# Patient Record
Sex: Female | Born: 1940 | Race: White | Hispanic: No | Smoking: Never smoker
Health system: Southern US, Community
[De-identification: ages and names within clinical notes are randomized; demographics above are authoritative.]

## PROBLEM LIST (undated history)

## (undated) DIAGNOSIS — I499 Cardiac arrhythmia, unspecified: Secondary | ICD-10-CM

## (undated) DIAGNOSIS — K219 Gastro-esophageal reflux disease without esophagitis: Secondary | ICD-10-CM

## (undated) DIAGNOSIS — Z87442 Personal history of urinary calculi: Secondary | ICD-10-CM

## (undated) DIAGNOSIS — E7800 Pure hypercholesterolemia, unspecified: Secondary | ICD-10-CM

## (undated) DIAGNOSIS — I10 Essential (primary) hypertension: Secondary | ICD-10-CM

## (undated) DIAGNOSIS — I480 Paroxysmal atrial fibrillation: Secondary | ICD-10-CM

## (undated) DIAGNOSIS — D509 Iron deficiency anemia, unspecified: Secondary | ICD-10-CM

## (undated) DIAGNOSIS — F419 Anxiety disorder, unspecified: Secondary | ICD-10-CM

## (undated) DIAGNOSIS — C4490 Unspecified malignant neoplasm of skin, unspecified: Secondary | ICD-10-CM

## (undated) DIAGNOSIS — M1990 Unspecified osteoarthritis, unspecified site: Secondary | ICD-10-CM

## (undated) DIAGNOSIS — I700 Atherosclerosis of aorta: Secondary | ICD-10-CM

## (undated) DIAGNOSIS — Z7901 Long term (current) use of anticoagulants: Secondary | ICD-10-CM

## (undated) HISTORY — PX: COLONOSCOPY: SHX174

## (undated) HISTORY — DX: Paroxysmal atrial fibrillation: I48.0

## (undated) HISTORY — PX: SEPTOPLASTY: SUR1290

## (undated) HISTORY — PX: ABDOMINAL HYSTERECTOMY: SHX81

## (undated) HISTORY — PX: JOINT REPLACEMENT: SHX530

---

## 1971-01-22 HISTORY — PX: BREAST EXCISIONAL BIOPSY: SUR124

## 2004-04-18 ENCOUNTER — Ambulatory Visit: Payer: Self-pay | Admitting: Unknown Physician Specialty

## 2004-09-18 ENCOUNTER — Other Ambulatory Visit: Payer: Self-pay

## 2004-09-18 ENCOUNTER — Observation Stay: Payer: Self-pay | Admitting: Internal Medicine

## 2005-05-14 ENCOUNTER — Ambulatory Visit: Payer: Self-pay | Admitting: Unknown Physician Specialty

## 2005-05-27 ENCOUNTER — Ambulatory Visit: Payer: Self-pay | Admitting: Gastroenterology

## 2005-08-06 ENCOUNTER — Ambulatory Visit: Payer: Self-pay | Admitting: Gastroenterology

## 2006-06-11 ENCOUNTER — Ambulatory Visit: Payer: Self-pay | Admitting: Unknown Physician Specialty

## 2006-11-13 ENCOUNTER — Emergency Department: Payer: Self-pay | Admitting: Emergency Medicine

## 2006-11-21 ENCOUNTER — Emergency Department: Payer: Self-pay | Admitting: Emergency Medicine

## 2007-06-23 ENCOUNTER — Ambulatory Visit: Payer: Self-pay | Admitting: Unknown Physician Specialty

## 2008-02-18 ENCOUNTER — Ambulatory Visit: Payer: Self-pay | Admitting: Unknown Physician Specialty

## 2008-06-29 ENCOUNTER — Ambulatory Visit: Payer: Self-pay | Admitting: Unknown Physician Specialty

## 2009-07-17 ENCOUNTER — Ambulatory Visit: Payer: Self-pay | Admitting: Unknown Physician Specialty

## 2010-01-07 ENCOUNTER — Inpatient Hospital Stay: Payer: Self-pay | Admitting: Internal Medicine

## 2010-09-19 ENCOUNTER — Ambulatory Visit: Payer: Self-pay | Admitting: Unknown Physician Specialty

## 2010-09-25 ENCOUNTER — Ambulatory Visit: Payer: Self-pay | Admitting: Gastroenterology

## 2010-11-30 ENCOUNTER — Ambulatory Visit: Payer: Self-pay | Admitting: Orthopedic Surgery

## 2010-12-17 ENCOUNTER — Ambulatory Visit: Payer: Self-pay | Admitting: Orthopedic Surgery

## 2010-12-17 DIAGNOSIS — I10 Essential (primary) hypertension: Secondary | ICD-10-CM

## 2011-01-17 ENCOUNTER — Inpatient Hospital Stay: Payer: Self-pay | Admitting: Orthopedic Surgery

## 2011-01-18 LAB — PATHOLOGY REPORT

## 2011-09-20 ENCOUNTER — Ambulatory Visit: Payer: Self-pay | Admitting: Physician Assistant

## 2012-09-22 ENCOUNTER — Ambulatory Visit: Payer: Self-pay | Admitting: Physician Assistant

## 2013-09-23 ENCOUNTER — Ambulatory Visit: Payer: Self-pay | Admitting: Physician Assistant

## 2013-10-05 ENCOUNTER — Ambulatory Visit: Payer: Self-pay | Admitting: Physician Assistant

## 2013-11-07 ENCOUNTER — Emergency Department: Payer: Self-pay | Admitting: Student

## 2013-12-05 ENCOUNTER — Observation Stay: Payer: Self-pay | Admitting: Internal Medicine

## 2013-12-05 ENCOUNTER — Ambulatory Visit: Payer: Self-pay | Admitting: Orthopedic Surgery

## 2013-12-05 LAB — CBC WITH DIFFERENTIAL/PLATELET
Basophil #: 0.1 10*3/uL (ref 0.0–0.1)
Basophil %: 0.6 %
Eosinophil #: 0.3 10*3/uL (ref 0.0–0.7)
Eosinophil %: 3.2 %
HCT: 38.5 % (ref 35.0–47.0)
HGB: 12.6 g/dL (ref 12.0–16.0)
LYMPHS ABS: 3 10*3/uL (ref 1.0–3.6)
Lymphocyte %: 33 %
MCH: 32 pg (ref 26.0–34.0)
MCHC: 32.7 g/dL (ref 32.0–36.0)
MCV: 98 fL (ref 80–100)
MONO ABS: 0.9 x10 3/mm (ref 0.2–0.9)
Monocyte %: 9.6 %
Neutrophil #: 5 10*3/uL (ref 1.4–6.5)
Neutrophil %: 53.6 %
Platelet: 269 10*3/uL (ref 150–440)
RBC: 3.93 10*6/uL (ref 3.80–5.20)
RDW: 13 % (ref 11.5–14.5)
WBC: 9.2 10*3/uL (ref 3.6–11.0)

## 2013-12-05 LAB — COMPREHENSIVE METABOLIC PANEL
ALT: 22 U/L
Albumin: 3.1 g/dL — ABNORMAL LOW (ref 3.4–5.0)
Alkaline Phosphatase: 38 U/L — ABNORMAL LOW
Anion Gap: 3 — ABNORMAL LOW (ref 7–16)
BUN: 30 mg/dL — AB (ref 7–18)
Bilirubin,Total: 0.4 mg/dL (ref 0.2–1.0)
Calcium, Total: 8.4 mg/dL — ABNORMAL LOW (ref 8.5–10.1)
Chloride: 109 mmol/L — ABNORMAL HIGH (ref 98–107)
Co2: 29 mmol/L (ref 21–32)
Creatinine: 0.99 mg/dL (ref 0.60–1.30)
Glucose: 101 mg/dL — ABNORMAL HIGH (ref 65–99)
Osmolality: 288 (ref 275–301)
POTASSIUM: 3.8 mmol/L (ref 3.5–5.1)
SGOT(AST): 9 U/L — ABNORMAL LOW (ref 15–37)
Sodium: 141 mmol/L (ref 136–145)
Total Protein: 6.1 g/dL — ABNORMAL LOW (ref 6.4–8.2)

## 2013-12-05 LAB — URINALYSIS, COMPLETE
BLOOD: NEGATIVE
Bilirubin,UR: NEGATIVE
Glucose,UR: NEGATIVE mg/dL (ref 0–75)
KETONE: NEGATIVE
LEUKOCYTE ESTERASE: NEGATIVE
NITRITE: NEGATIVE
PROTEIN: NEGATIVE
Ph: 6 (ref 4.5–8.0)
RBC,UR: 4 /HPF (ref 0–5)
Specific Gravity: 1.029 (ref 1.003–1.030)
Squamous Epithelial: 7
WBC UR: 4 /HPF (ref 0–5)

## 2013-12-06 LAB — CBC WITH DIFFERENTIAL/PLATELET
Basophil #: 0 10*3/uL (ref 0.0–0.1)
Basophil %: 0.1 %
EOS ABS: 0 10*3/uL (ref 0.0–0.7)
Eosinophil %: 0 %
HCT: 37.6 % (ref 35.0–47.0)
HGB: 12.3 g/dL (ref 12.0–16.0)
LYMPHS ABS: 1.4 10*3/uL (ref 1.0–3.6)
LYMPHS PCT: 22.8 %
MCH: 32.3 pg (ref 26.0–34.0)
MCHC: 32.6 g/dL (ref 32.0–36.0)
MCV: 99 fL (ref 80–100)
Monocyte #: 0.1 x10 3/mm — ABNORMAL LOW (ref 0.2–0.9)
Monocyte %: 1.7 %
NEUTROS ABS: 4.7 10*3/uL (ref 1.4–6.5)
NEUTROS PCT: 75.4 %
Platelet: 283 10*3/uL (ref 150–440)
RBC: 3.8 10*6/uL (ref 3.80–5.20)
RDW: 13.1 % (ref 11.5–14.5)
WBC: 6.2 10*3/uL (ref 3.6–11.0)

## 2013-12-06 LAB — BASIC METABOLIC PANEL
ANION GAP: 5 — AB (ref 7–16)
BUN: 29 mg/dL — ABNORMAL HIGH (ref 7–18)
CALCIUM: 8 mg/dL — AB (ref 8.5–10.1)
CREATININE: 0.88 mg/dL (ref 0.60–1.30)
Chloride: 109 mmol/L — ABNORMAL HIGH (ref 98–107)
Co2: 27 mmol/L (ref 21–32)
EGFR (African American): >60
EGFR (Non-African Amer.): >60
GLUCOSE: 157 mg/dL — AB (ref 65–99)
Osmolality: 290 (ref 275–301)
Potassium: 5.2 mmol/L — ABNORMAL HIGH (ref 3.5–5.1)
SODIUM: 141 mmol/L (ref 136–145)

## 2014-01-12 ENCOUNTER — Ambulatory Visit: Payer: Self-pay | Admitting: Physician Assistant

## 2014-05-14 NOTE — Discharge Summary (Signed)
PATIENT NAMECARRIANN, [name] MR#:  [id number] DATE OF BIRTH:  October 11, 1940  DATE OF ADMISSION:  12/05/2013 DATE OF DISCHARGE:  12/07/2013  DISCHARGE DIAGNOSES: 1.  Low back pain and lumbar radiculopathy. 2.  Hypertension.  3.  Depression.  DISCHARGE MEDICATIONS: 1.  Omeprazole 20 mg p.o. b.i.d.  2.  HCTZ/Losartan 12.5/100 mg p.o. daily. 3.  Lexapro 10 mg p.o. at bedtime. 4.  Metoprolol succinate 25 mg p.o. daily.  5.  Simvastatin 20 mg p.o. daily. 6.  Calcium with vitamin D 1 tablet at night. 7.  Estradiol 0.5 mg p.o. daily. 8.  Tylenol Arthritis 650 mg to 1000 mg daily as needed for pain.  9.  Bisacodyl 10 mg rectal suppository as needed for constipation.  10.  Flexeril 10 mg p.o. t.i.d. 11.  Prednisone 20 mg 3 tablets daily for 3 days, 2 tablets daily for 3 days, then 1 tablet daily for 3 days.   DISCHARGE FOLLOWUP: The patient will follow up with Dr. Rudene Christians in 2 weeks.   DISPOSITION: Discharge to [facility] for rehab.   CONSULTATIONS: Orthopedics with Dr. Stacy Gardner.   HOSPITAL COURSE: This is a 74 year old female patient who has been admitted because of low back pain radiating to the right leg up to the knee joint. The patient was unable to ambulate at home and admitted to observation status secondary to severe low back pain and going to the right leg with ambulatory dysfunction. The patient has been having this problem for about 2 to 3 weeks and sees Dr. Rudene Christians for that. She had an epidural steroid injection 10 days ago and was given hydrocodone. The patient felt the symptoms are not getting better despite the steroid injection and narcotics, and she has trouble with ambulation, so because of that she was admitted to hospitalist service. The patient has past medical history significant for hypertension, depression and hyperlipidemia. The patient's CT scan of the lower back showed mild arthrosis, no fractures. She was started on IV Dilaudid along with Percocet 5/325 every 4 to 6 hours and  prednisone and Flexeril. The patient's symptoms improved and she says she can go home and take care of herself. Physical therapy has seen the patient, and the patient is accepted at [facility] rehab. The patient advised to have MRI of LS-spine, but she got very claustrophobic and they could not do MRI. The patient told me that she does not want MRI of the LS-spine to evaluate for any disk herniation or nerve impingement. I told her that she can continue Flexeril, Percocet and prednisone and see Dr. Rudene Christians in 7 to 10 days, and if she needs MRI that can be arranged as open MRI. The patient's has other medical problems of hypertension, which is controlled, and has history of hyperlipidemia. The patient's blood pressure today is 154/64, heart rate 64. The patient's blood pressure was elevated at times yesterday and today morning it was 206/69, but repeat blood pressure is within normal limits. The patient's condition discussed with the family.  PHYSICAL EXAMINATION TODAY:  CARDIOVASCULAR: S1, S2 regular.  LUNGS: Clear to auscultation. No wheeze, no rales. BACK: There is no tenderness, like lower back.  NEUROLOGIC: Power is 5/5 in the upper and lower extremities. The patient has no sensory deficit in the lower extremities. SLR test is negative. DIAGNOSTIC DATA: Pertinent lab data: WBC 6.2, hemoglobin 12.3, hematocrit 37.6, platelets 283,000. Electrolytes: Sodium 141, potassium 5.2, chloride 109, bicarb 27, BUN 29, creatinine 0.88, glucose 157. The patient's UA is clear.  CT of the lumbar spine showed degenerative disk disease and ligamentum flavum hypertrophy at L3, L4 and L5. The patient has moderate large broad-based disk bulge present at L4, L5. Nonobstructing right renal calculus.   The patient will go to [facility]. Primary doctor is Ms. Paulita Cradle. She will follow up with Dr. Rudene Christians as well.  TIME SPENT: More than 35 minutes.  ____________________________ Epifanio Lesches,  MD sk:sb D: 12/07/2013 09:58:27 ET T: 12/07/2013 10:14:24 ET JOB#: [id number]  cc: Epifanio Lesches, MD, <Dictator> Laurene Footman, MD [facility]" [place], [place] Epifanio Lesches MD ELECTRONICALLY SIGNED 12/20/2013 17:51

## 2014-05-14 NOTE — Consult Note (Signed)
PATIENT NAMEAMALEA, OTTEY MR#:  [id number] DATE OF BIRTH:  07-Apr-1940  DATE OF CONSULTATION:  12/05/2013  REFERRING PHYSICIAN:   CONSULTING PHYSICIAN:  Claud Kelp, MD  CHIEF COMPLAINT: Back and radiating right leg pain.   HISTORY OF PRESENT ILLNESS: Ms. Siebels is a 74 year old female who is well-known to Dr. 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 She presented to the Emergency Room today with a severe episode of lower back pain radiating to her right leg. She states the pain radiated down the posterior lateral aspect of her leg and stopped at the level of the knee. Her pain was improved narcotic pain medication, as well as oral steroids, and she was admitted for pain control and further evaluation. She denies any numbness or tingling down into her foot. She denies any loss of bowel or bladder control and she denies any saddle anesthesia. She also denies any muscle atrophy or weakness, and states this was just an acute pain episode.   PAST MEDICAL HISTORY: Significant for reflux, arthritis, hypertension, hypercholesterolemia, and her sciatica.   HOME MEDICATIONS: Include vitamin D3, Zocor, Tylenol, OxyContin, Nucynta, metoprolol, Lexapro, hydrochlorothiazide, estradiol, calcium, and Protonix.   ALLERGIES: She has no known drug allergies.   FAMILY HISTORY: Noncontributory.   REVIEW OF SYSTEMS: She denies any headache, blurry vision, double vision, hearing loss, or tinnitus. She denies any difficulty swallowing. She denies chest pain or shortness of breath. She denies nausea, vomiting, but she is positive for constipation.   PHYSICAL EXAMINATION:  GENERAL: The patient is resting comfortably in bed.  EXTREMITIES: She has sensation intact in all distributions to her right lower extremity. She has intact patellar and Achilles reflexes. She has 5/5 strength to ankle dorsiflexion, great toe  dorsiflexion, and ankle plantar flexion, as well as hip flexion, and knee flexion and knee extension. She has negative log roll and negative Stinchfield test. She has a negative straight leg test and negative crossed straight leg tests.  RADIOGRAPHS: CT scan was reviewed, which demonstrates some mild arthrosis, but no fracture seen.   ASSESSMENT: This is a 74 year old female with an episode of acute low back pain and radiculopathy. The patient was counseled she may benefit from an MRI of her lumbar spine to look for any nerve root impingement, disk herniation, or facet arthrosis that may be triggering her pain. Continue supportive measures at this time and awaiting lumbar spine MRI.    ____________________________ Claud Kelp, MD tte:ts D: 12/05/2013 16:19:26 ET T: 12/05/2013 16:56:20 ET JOB#: [id number]  cc: Claud Kelp, MD, <Dictator> Claud Kelp MD ELECTRONICALLY SIGNED 12/05/2013 18:59

## 2014-05-14 NOTE — Consult Note (Signed)
Brief Consult Note: Diagnosis: lumbar radiculopathy.   Patient was seen by consultant.   Consult note dictated.   Orders entered.   Comments: Recommend L-spine MRI to furter assess.  Electronic Signatures: Claud Kelp (MD)  (Signed [phone] 16:20)  Authored: Brief Consult Note   Last Updated: 15-Nov-15 16:20 by Claud Kelp (MD)

## 2014-05-14 NOTE — H&P (Signed)
PATIENT NAME:  [name], [name] MR#:  [id number] DATE OF BIRTH:  17-Aug-1940  DATE OF ADMISSION:  12/05/2013  REFERRING PHYSICIAN: Briant Sites. Joni Fears, MD   FAMILY MEDICAL PROVIDER: Prentiss Bells "Mimi" Carrie Mew, PA-C  REASON FOR ADMISSION: Intractable back pain.   HISTORY OF PRESENT ILLNESS: The patient is a 74 year old female with a significant history of known degenerative disk disease, chronic atrial fibrillation, obesity, and hypertension. She is followed closely by Dr. Rudene Christians and has received previous injection therapy for sciatica. She presents now with acute onset of low back pain radiating to the right leg. She is unable to ambulate. She was given multiple doses of Dilaudid in the Emergency Room with no improvement of her symptoms. CT shows a disk bulge in the lumbar area. She is now admitted for further evaluation.   PAST MEDICAL HISTORY:  1.  Chronic back pain.  2.  Degenerative disk disease.  3.  GE reflux disease.  4.  Osteoarthritis.  5.  Hyperlipidemia.  6.  Benign hypertension.  7.  Chronic atrial fibrillation.  8.  Status post hysterectomy.  9.  Status post appendectomy.   MEDICATIONS:  1.  Vitamin D3 2000 units p.o. daily.  2.  Simvastatin 20 mg p.o. at bedtime.  3.  OxyContin 10 mg p.o. q.12 hours p.r.n.  4.  Omeprazole 20 mg p.o. b.i.d. p.r.n.  5.  Norco 5/325 one p.o. q.8 hours p.r.n. pain.  6.  Toprol-XL 25 mg p.o. daily.  7.  Lexapro 10 mg p.o. daily.  8.  Hyzaar 100/12.5 one p.o. daily. 9.  Estrace 0.5 mg p.o. daily.   ALLERGIES: No known drug allergies.   SOCIAL HISTORY: Negative for alcohol or tobacco abuse.   FAMILY HISTORY: Positive for hypertension and stroke. Negative for breast or colon cancer.   REVIEW OF SYSTEMS:  CONSTITUTIONAL: No fever or change in weight.  EYES: No blurred or double vision. No glaucoma.  ENT: No tinnitus or hearing loss. No nasal discharge or bleeding. No difficulty swallowing.  RESPIRATORY: No cough or wheezing. Denies hemoptysis.  No painful respiration.  CARDIOVASCULAR: No chest pain or orthopnea. No palpitations or syncope.  GASTROINTESTINAL: No nausea, vomiting, or diarrhea. No abdominal pain. No change in bowel habits.  GENITOURINARY: No dysuria or hematuria. No incontinence.  ENDOCRINE: No polyuria or polydipsia. No heat or cold intolerance.  HEMATOLOGIC: The patient denies anemia, easy bruising, or bleeding.  LYMPHATIC: No swollen glands.  MUSCULOSKELETAL: The patient denies pain in her neck, shoulders, although she does have back, hip, and knee pain on the right.  NEUROLOGIC: No migraines. Denies stroke or seizures.  PSYCHOLOGICAL: The patient denies anxiety, insomnia or depression.   PHYSICAL EXAMINATION:  GENERAL: The patient is acutely ill-appearing, in moderate distress.  VITAL SIGNS: Currently remarkable for a blood pressure of 227/90 with a heart rate of 80, respiratory rate of 24, temperature of 98.3, saturation 100% on room air.  HEENT: Normocephalic, atraumatic. Pupils are equally round, reactive to light and accommodation. Extraocular movements are intact. Sclerae are not icteric. Conjunctivae are clear. Oropharynx is clear.  NECK: Supple without JVD. No adenopathy or thyromegaly is noted.  LUNGS: Clear to auscultation and percussion without wheezes, rales, or rhonchi. No dullness. Respiratory effort is normal.  CARDIAC: Irregularly irregular rhythm. No significant rubs or gallops. PMI is nondisplaced. Chest wall is nontender.  ABDOMEN: Soft, nontender, with normoactive bowel sounds. No organomegaly or masses were appreciated. No hernias or bruits were noted.  EXTREMITIES: Without clubbing, cyanosis or edema. Pulses were 2+  bilaterally.  SKIN: Warm and dry without rash or lesions.  NEUROLOGIC: Cranial nerves II through XII grossly intact. Deep tendon reflexes are symmetric. Motor and sensory examination is nonfocal.  PSYCHIATRIC: Revealed a patient who is alert and oriented to person, place, and time.  She was cooperative and used good judgment.   LABORATORY DATA: White count was 9.2 with a hemoglobin of 12.6. Glucose 101 with a BUN of 30, creatinine of 0.99, and a potassium of 3.8 with a sodium of 141. Urinalysis was unremarkable. CT of the lumbar spine revealed degenerative disk disease with broad-based bulges at L3-L4 and L4-L5. There is some spinal narrowing noted at L4-L5.   ASSESSMENT:  1.  Degenerative disk disease with lumbar radiculopathy.  2.  Ambulatory dysfunction.  3.  Intractable back pain.  4.  Chronic atrial fibrillation.  5.  Obesity.  6.  Benign hypertension.   PLAN: The patient will be observed on telemetry with IV Dilaudid as needed for pain. We will give a 1-time dose of IV steroids at this time and begin prednisone tomorrow. We will begin Flexeril and a heating pad. We will consult physical therapy and orthopedics. Continue her outpatient regimen including aspirin. Follow up routine labs in the morning. Further treatment and evaluation will depend upon the patient's progress.   TOTAL TIME SPENT ON THIS PATIENT: 50 minutes.    ____________________________ Leonie Douglas Doy Hutching, MD jds:ah D: 12/05/2013 15:10:33 ET T: 12/05/2013 16:29:30 ET JOB#: [id number]  cc: Leonie Douglas. Doy Hutching, MD, <Dictator>  Lennice Sites MD ELECTRONICALLY SIGNED 12/05/2013 21:22

## 2014-05-15 NOTE — Discharge Summary (Signed)
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   ATTENDING: Hessie Knows, MD ([place])  PROCEDURES: On 01/17/2011 the patient underwent right total knee replacement by Dr. Rudene Christians.   ANESTHESIA: Spinal.   ESTIMATED BLOOD LOSS: 100 mL.  TOURNIQUET TIME: 59 minutes.  SPECIMENS SENT: Cut ends of bone.   OPERATIVE FINDINGS: Severe medial compartment and patellofemoral erosion to bone.   IMPLANTS: Medacta, My-Knee.  DRAINS: None were placed.   COMPLICATIONS: No complications occurred.   HISTORY: Ms. Winterhalter is a pleasant 74 year old with history of right knee osteoarthritis. She had had prior steroid and Synvisc injections with minimal relief. She had been taking nabumetone as well as trying a home exercise program. She has been having increased pain and difficulty doing activities of daily living like taking care of things around the home as well as any extended walking outside the home including going to the store. She has had pain at night as well as during the day. It bothers her a great deal after sitting for prolonged time. The pain has been anterior as well as medial. Prior radiographic films show medial compartment and patellofemoral degenerative changes.   PHYSICAL EXAMINATION: HEART: Reasonable rate but some abnormality of rhythm. No murmur noted. LUNGS: Posterior fields clear to auscultation. RIGHT KNEE: Previous exam revealed crepitation of range of motion with 10 to 100 degrees range of motion. No instability. There is a moderate Baker's cyst as well as moderate effusion. There is crepitation to the medial and patellofemoral joint regions.   HOSPITAL COURSE: On 01/17/2011 the patient  underwent the aforementioned procedure without complication and was transferred to the PAC-U then the orthopedic floor in stable condition. She would be followed also by Vance Peper PA-C after surgery. Hemoglobin was 10.4 on the first day postoperative. The patient was treated with Lovenox, TED hose, and AV-I boots for deep vein thrombosis prophylaxis. She complained of severe pain to the left knee actually the first night, unfortunately. Her pain would improve as time went on. Her hemoglobin was 9.4 on the second day postoperative. Her incision site was found to be clean and staples were intact upon dressing change. The patient would work while here on multiple occasions with physical therapy ambulating 200 feet with a rolling walker on 01/21/2011. The patient would end up tolerating her diet quite well. She did pass some stool on 01/20/2011.   CONDITION AT DISCHARGE: Stable.   DISPOSITION: Home with home health physical therapy.   DISCHARGE MEDICATIONS:  1. Nucynta 75 mg 1 to 2 every six hours as needed for pain.  2. OxyContin 10 mg every 12 hours. She may also take Tylenol as needed.  3. We request she take a calcium and vitamin D supplement/multivitamin.  4. Xarelto 10 mg once a day.   DISCHARGE INSTRUCTIONS AND FOLLOW-UP: 1. Resume home medications.  2. Regular diet.  3. Weight-bearing as tolerated on the surgical leg. She will elevate it. She wear bilateral TED hose during the day.  4. She will continue using her Polar Care unit but leave her knee dressing on.      5. She will call our office for any disturbing symptoms. She will call our office to confirm a two week follow-up appointment on 01/31/2011 at 9:15  a.m.  6. She will not take nabumetone or aspirin until finished with her Xarelto.  ____________________________ Jerrel Ivory. , [place] jrp:slb D: 01/23/2011 11:20:52 ET T: 01/24/2011 13:29:59 ET JOB#: [id number]  cc: Jerrel Ivory. Charlett Nose, [place], <Dictator> [place]  PA ELECTRONICALLY SIGNED 01/25/2011 8:03

## 2014-07-12 NOTE — Discharge Instructions (Signed)

## 2014-07-13 ENCOUNTER — Ambulatory Visit
Admission: RE | Admit: 2014-07-13 | Discharge: 2014-07-13 | Disposition: A | Discharge status: Home / Self Care | Payer: Medicare Other | Source: Ambulatory Visit | Attending: Ophthalmology | Admitting: Ophthalmology

## 2014-07-13 ENCOUNTER — Ambulatory Visit: Payer: Medicare Other | Admitting: Anesthesiology

## 2014-07-13 ENCOUNTER — Encounter
Admission: RE | Disposition: A | Discharge status: Home / Self Care | Payer: Self-pay | Source: Ambulatory Visit | Attending: Ophthalmology

## 2014-07-13 ENCOUNTER — Encounter: Payer: Self-pay | Admitting: *Deleted

## 2014-07-13 DIAGNOSIS — Z885 Allergy status to narcotic agent status: Secondary | ICD-10-CM | POA: Diagnosis not present

## 2014-07-13 DIAGNOSIS — Z85828 Personal history of other malignant neoplasm of skin: Secondary | ICD-10-CM | POA: Diagnosis not present

## 2014-07-13 DIAGNOSIS — K219 Gastro-esophageal reflux disease without esophagitis: Secondary | ICD-10-CM | POA: Insufficient documentation

## 2014-07-13 DIAGNOSIS — E780 Pure hypercholesterolemia: Secondary | ICD-10-CM | POA: Diagnosis not present

## 2014-07-13 DIAGNOSIS — Z96651 Presence of right artificial knee joint: Secondary | ICD-10-CM | POA: Diagnosis not present

## 2014-07-13 DIAGNOSIS — I10 Essential (primary) hypertension: Secondary | ICD-10-CM | POA: Diagnosis not present

## 2014-07-13 DIAGNOSIS — Z90710 Acquired absence of both cervix and uterus: Secondary | ICD-10-CM | POA: Insufficient documentation

## 2014-07-13 DIAGNOSIS — I4891 Unspecified atrial fibrillation: Secondary | ICD-10-CM | POA: Insufficient documentation

## 2014-07-13 DIAGNOSIS — H2511 Age-related nuclear cataract, right eye: Secondary | ICD-10-CM | POA: Diagnosis not present

## 2014-07-13 DIAGNOSIS — F419 Anxiety disorder, unspecified: Secondary | ICD-10-CM | POA: Insufficient documentation

## 2014-07-13 HISTORY — DX: Pure hypercholesterolemia, unspecified: E78.00

## 2014-07-13 HISTORY — DX: Gastro-esophageal reflux disease without esophagitis: K21.9

## 2014-07-13 HISTORY — DX: Cardiac arrhythmia, unspecified: I49.9

## 2014-07-13 HISTORY — DX: Essential (primary) hypertension: I10

## 2014-07-13 HISTORY — DX: Anxiety disorder, unspecified: F41.9

## 2014-07-13 HISTORY — PX: CATARACT EXTRACTION W/PHACO: SHX586

## 2014-07-13 HISTORY — DX: Unspecified malignant neoplasm of skin, unspecified: C44.90

## 2014-07-13 SURGERY — PHACOEMULSIFICATION, CATARACT, WITH IOL INSERTION
Anesthesia: Monitor Anesthesia Care | Laterality: Right

## 2014-07-13 MED ORDER — EPINEPHRINE HCL 1 MG/ML IJ SOLN
INTRAMUSCULAR | Status: DC | PRN
  Administered 2014-07-13: 69 mL via OPHTHALMIC

## 2014-07-13 MED ORDER — ARMC OPHTHALMIC DILATING GEL
1.0000 "application " | OPHTHALMIC | Status: DC | PRN
  Administered 2014-07-13 (×2): 1 via OPHTHALMIC

## 2014-07-13 MED ORDER — TIMOLOL MALEATE 0.5 % OP SOLN
OPHTHALMIC | Status: DC | PRN
  Administered 2014-07-13: 1 [drp] via OPHTHALMIC

## 2014-07-13 MED ORDER — NA HYALUR & NA CHOND-NA HYALUR 0.4-0.35 ML IO KIT
PACK | INTRAOCULAR | Status: DC | PRN
  Administered 2014-07-13: 1 mL via INTRAOCULAR

## 2014-07-13 MED ORDER — MIDAZOLAM HCL 2 MG/2ML IJ SOLN
INTRAMUSCULAR | Status: DC | PRN
  Administered 2014-07-13: 2 mg via INTRAVENOUS

## 2014-07-13 MED ORDER — HYDRALAZINE HCL 20 MG/ML IJ SOLN
10.0000 mg | Freq: Once | INTRAMUSCULAR | Status: AC
  Administered 2014-07-13: 10 mg via INTRAVENOUS

## 2014-07-13 MED ORDER — POVIDONE-IODINE 5 % OP SOLN
1.0000 "application " | Freq: Once | OPHTHALMIC | Status: AC
  Administered 2014-07-13: 1 via OPHTHALMIC

## 2014-07-13 MED ORDER — BRIMONIDINE TARTRATE 0.2 % OP SOLN
OPHTHALMIC | Status: DC | PRN
  Administered 2014-07-13: 1 [drp] via OPHTHALMIC

## 2014-07-13 MED ORDER — CEFUROXIME OPHTHALMIC INJECTION 1 MG/0.1 ML
INJECTION | OPHTHALMIC | Status: DC | PRN
  Administered 2014-07-13: 0.1 mL via INTRACAMERAL

## 2014-07-13 MED ORDER — FENTANYL CITRATE (PF) 100 MCG/2ML IJ SOLN
INTRAMUSCULAR | Status: DC | PRN
  Administered 2014-07-13 (×2): 50 ug via INTRAVENOUS

## 2014-07-13 MED ORDER — TETRACAINE HCL 0.5 % OP SOLN
1.0000 [drp] | Freq: Once | OPHTHALMIC | Status: AC
  Administered 2014-07-13: 1 [drp] via OPHTHALMIC

## 2014-07-13 SURGICAL SUPPLY — 26 items
CANNULA ANT/CHMB 27GA (MISCELLANEOUS) ×3 IMPLANT
GLOVE SURG LX 7.5 STRW (GLOVE) ×2
GLOVE SURG LX STRL 7.5 STRW (GLOVE) ×1 IMPLANT
GLOVE SURG TRIUMPH 8.0 PF LTX (GLOVE) ×3 IMPLANT
GOWN STRL REUS W/ TWL LRG LVL3 (GOWN DISPOSABLE) ×2 IMPLANT
GOWN STRL REUS W/TWL LRG LVL3 (GOWN DISPOSABLE) ×4
LENS IOL TECNIS 26.0 (Intraocular Lens) ×3 IMPLANT
LENS IOL TECNIS MONO 1P 26.0 (Intraocular Lens) ×1 IMPLANT
MARKER SKIN SURG W/RULER VIO (MISCELLANEOUS) ×3 IMPLANT
NDL RETROBULBAR .5 NSTRL (NEEDLE) IMPLANT
NEEDLE FILTER BLUNT 18X 1/2SAF (NEEDLE) ×2
NEEDLE FILTER BLUNT 18X1 1/2 (NEEDLE) ×1 IMPLANT
PACK CATARACT BRASINGTON (MISCELLANEOUS) ×3 IMPLANT
PACK EYE AFTER SURG (MISCELLANEOUS) ×3 IMPLANT
PACK OPTHALMIC (MISCELLANEOUS) ×3 IMPLANT
RING MALYGIN 7.0 (MISCELLANEOUS) IMPLANT
SUT ETHILON 10-0 CS-B-6CS-B-6 (SUTURE)
SUT VICRYL  9 0 (SUTURE)
SUT VICRYL 9 0 (SUTURE) IMPLANT
SUTURE EHLN 10-0 CS-B-6CS-B-6 (SUTURE) IMPLANT
SYR 3ML LL SCALE MARK (SYRINGE) ×3 IMPLANT
SYR 5ML LL (SYRINGE) IMPLANT
SYR TB 1ML LUER SLIP (SYRINGE) ×3 IMPLANT
WATER STERILE IRR 250ML POUR (IV SOLUTION) ×3 IMPLANT
WATER STERILE IRR 500ML POUR (IV SOLUTION) IMPLANT
WIPE NON LINTING 3.25X3.25 (MISCELLANEOUS) ×3 IMPLANT

## 2014-07-13 NOTE — Anesthesia Preprocedure Evaluation (Signed)
Anesthesia Evaluation  Patient identified by MRN, date of birth, ID band  Reviewed: Allergy & Precautions, H&P , NPO status , Patient's Chart, lab work & pertinent test results  Airway Mallampati: II  TM Distance: >3 FB Neck ROM: full    Dental no notable dental hx.    Pulmonary    Pulmonary exam normal       Cardiovascular hypertension, + dysrhythmias Atrial Fibrillation Rhythm:regular Rate:Normal     Neuro/Psych    GI/Hepatic GERD-  ,  Endo/Other    Renal/GU      Musculoskeletal   Abdominal   Peds  Hematology   Anesthesia Other Findings   Reproductive/Obstetrics                             Anesthesia Physical Anesthesia Plan  ASA: II  Anesthesia Plan: MAC   Post-op Pain Management:    Induction:   Airway Management Planned:   Additional Equipment:   Intra-op Plan:   Post-operative Plan:   Informed Consent: I have reviewed the patients History and Physical, chart, labs and discussed the procedure including the risks, benefits and alternatives for the proposed anesthesia with the patient or authorized representative who has indicated his/her understanding and acceptance.     Plan Discussed with: CRNA  Anesthesia Plan Comments:         Anesthesia Quick Evaluation

## 2014-07-13 NOTE — H&P (Signed)
  The History and Physical notes were scanned in.  The patient remains stable and unchanged from the H&P.   Previous H&P reviewed, patient examined, and there are no changes.  , 07/13/2014 10:49 AM

## 2014-07-13 NOTE — Anesthesia Postprocedure Evaluation (Signed)
  Anesthesia Post-op Note  Patient: [name]  Procedure(s) Performed: Procedure(s): CATARACT EXTRACTION PHACO AND INTRAOCULAR LENS PLACEMENT (IOC) (Right)  Anesthesia type:MAC  Patient location: PACU  Post pain: Pain level controlled  Post assessment: Post-op Vital signs reviewed, Patient's Cardiovascular Status Stable, Respiratory Function Stable, Patent Airway and No signs of Nausea or vomiting  Post vital signs: Reviewed and stable  Last Vitals:  Filed Vitals:   07/13/14 1208  BP: 117/89  Pulse: 61  Temp:   Resp: 16    Level of consciousness: awake, alert  and patient cooperative  Complications: No apparent anesthesia complications

## 2014-07-13 NOTE — Transfer of Care (Signed)
Immediate Anesthesia Transfer of Care Note  Patient: [name]  Procedure(s) Performed: Procedure(s): CATARACT EXTRACTION PHACO AND INTRAOCULAR LENS PLACEMENT (IOC) (Right)  Patient Location: PACU  Anesthesia Type: MAC  Level of Consciousness: awake, alert  and patient cooperative  Airway and Oxygen Therapy: Patient Spontanous Breathing and Patient connected to supplemental oxygen  Post-op Assessment: Post-op Vital signs reviewed, Patient's Cardiovascular Status Stable, Respiratory Function Stable, Patent Airway and No signs of Nausea or vomiting  Post-op Vital Signs: Reviewed and stable  Complications: No apparent anesthesia complications

## 2014-07-13 NOTE — Op Note (Signed)
LOCATION:  [place]   PREOPERATIVE DIAGNOSIS:    Nuclear sclerotic cataract right eye. H25.11   POSTOPERATIVE DIAGNOSIS:  Nuclear sclerotic cataract right eye.     PROCEDURE:  Phacoemusification with posterior chamber intraocular lens placement of the right eye   LENS:   Implant Name Type Inv. Item Serial No. Manufacturer Lot No. LRB No. Used  LENS IMPL INTRAOC [id number] 26.0 - [id number] Intraocular Lens LENS IMPL INTRAOC [id number] 26.0 [id number] AMO   Right 1        ULTRASOUND TIME: 15 % of 0 minutes, 55 seconds.  CDE 15.3   SURGEON:  Wyonia Hough, MD   ANESTHESIA:  Topical with tetracaine drops and 2% Xylocaine jelly.   COMPLICATIONS:  None.   DESCRIPTION OF PROCEDURE:  The patient was identified in the holding room and transported to the operating room and placed in the supine position under the operating microscope.  The right eye was identified as the operative eye and it was prepped and draped in the usual sterile ophthalmic fashion.   A 1 millimeter clear-corneal paracentesis was made at the 12:00 position.  The anterior chamber was filled with Viscoat viscoelastic.  A 2.4 millimeter keratome was used to make a near-clear corneal incision at the 9:00 position.  A curvilinear capsulorrhexis was made with a cystotome and capsulorrhexis forceps.  Balanced salt solution was used to hydrodissect and hydrodelineate the nucleus.   Phacoemulsification was then used in stop and chop fashion to remove the lens nucleus and epinucleus.  The remaining cortex was then removed using the irrigation and aspiration handpiece. Provisc was then placed into the capsular bag to distend it for lens placement.  A lens was then injected into the capsular bag.  The remaining viscoelastic was aspirated.   Wounds were hydrated with balanced salt solution.  The anterior chamber was inflated to a physiologic pressure with balanced salt solution.  No wound leaks were noted. Cefuroxime 0.1 ml of a  10mg /ml solution was injected into the anterior chamber for a dose of 1 mg of intracameral antibiotic at the completion of the case.   Timolol and Brimonidine drops were applied to the eye.  The patient was taken to the recovery room in stable condition without complications of anesthesia or surgery.   , 07/13/2014, 11:48 AM

## 2014-07-13 NOTE — Anesthesia Procedure Notes (Signed)
Procedure Name: MAC Date/Time: 07/13/2014 11:26 AM Performed by: Mayme Genta Pre-anesthesia Checklist: Patient identified, Emergency Drugs available, Suction available, Timeout performed and Patient being monitored Patient Re-evaluated:Patient Re-evaluated prior to inductionOxygen Delivery Method: Nasal cannula Placement Confirmation: positive ETCO2

## 2014-07-14 ENCOUNTER — Encounter: Payer: Self-pay | Admitting: Ophthalmology

## 2014-09-15 ENCOUNTER — Other Ambulatory Visit: Payer: Self-pay | Admitting: Physician Assistant

## 2014-09-15 DIAGNOSIS — Z1231 Encounter for screening mammogram for malignant neoplasm of breast: Secondary | ICD-10-CM

## 2014-09-28 ENCOUNTER — Encounter: Payer: Self-pay | Admitting: *Deleted

## 2014-10-03 NOTE — Discharge Instructions (Signed)

## 2014-10-05 ENCOUNTER — Ambulatory Visit: Payer: Medicare Other | Admitting: Anesthesiology

## 2014-10-05 ENCOUNTER — Encounter: Payer: Self-pay | Admitting: *Deleted

## 2014-10-05 ENCOUNTER — Encounter
Admission: RE | Disposition: A | Discharge status: Home / Self Care | Payer: Self-pay | Source: Ambulatory Visit | Attending: Ophthalmology

## 2014-10-05 ENCOUNTER — Ambulatory Visit
Admission: RE | Admit: 2014-10-05 | Discharge: 2014-10-05 | Disposition: A | Discharge status: Home / Self Care | Payer: Medicare Other | Source: Ambulatory Visit | Attending: Ophthalmology | Admitting: Ophthalmology

## 2014-10-05 DIAGNOSIS — Z90710 Acquired absence of both cervix and uterus: Secondary | ICD-10-CM | POA: Diagnosis not present

## 2014-10-05 DIAGNOSIS — F419 Anxiety disorder, unspecified: Secondary | ICD-10-CM | POA: Diagnosis not present

## 2014-10-05 DIAGNOSIS — Z79899 Other long term (current) drug therapy: Secondary | ICD-10-CM | POA: Insufficient documentation

## 2014-10-05 DIAGNOSIS — I10 Essential (primary) hypertension: Secondary | ICD-10-CM | POA: Diagnosis not present

## 2014-10-05 DIAGNOSIS — Z7982 Long term (current) use of aspirin: Secondary | ICD-10-CM | POA: Insufficient documentation

## 2014-10-05 DIAGNOSIS — E780 Pure hypercholesterolemia: Secondary | ICD-10-CM | POA: Diagnosis not present

## 2014-10-05 DIAGNOSIS — H2512 Age-related nuclear cataract, left eye: Secondary | ICD-10-CM | POA: Diagnosis not present

## 2014-10-05 DIAGNOSIS — Z85828 Personal history of other malignant neoplasm of skin: Secondary | ICD-10-CM | POA: Insufficient documentation

## 2014-10-05 DIAGNOSIS — I4891 Unspecified atrial fibrillation: Secondary | ICD-10-CM | POA: Insufficient documentation

## 2014-10-05 DIAGNOSIS — Z885 Allergy status to narcotic agent status: Secondary | ICD-10-CM | POA: Insufficient documentation

## 2014-10-05 DIAGNOSIS — Z96651 Presence of right artificial knee joint: Secondary | ICD-10-CM | POA: Diagnosis not present

## 2014-10-05 DIAGNOSIS — Z9849 Cataract extraction status, unspecified eye: Secondary | ICD-10-CM | POA: Insufficient documentation

## 2014-10-05 HISTORY — PX: CATARACT EXTRACTION W/PHACO: SHX586

## 2014-10-05 SURGERY — PHACOEMULSIFICATION, CATARACT, WITH IOL INSERTION
Anesthesia: General | Laterality: Left | Wound class: Clean

## 2014-10-05 MED ORDER — MIDAZOLAM HCL 2 MG/2ML IJ SOLN
INTRAMUSCULAR | Status: DC | PRN
  Administered 2014-10-05: 1 mg via INTRAVENOUS

## 2014-10-05 MED ORDER — FENTANYL CITRATE (PF) 100 MCG/2ML IJ SOLN
INTRAMUSCULAR | Status: DC | PRN
  Administered 2014-10-05: 50 ug via INTRAVENOUS

## 2014-10-05 MED ORDER — LACTATED RINGERS IV SOLN: INTRAVENOUS | Status: DC

## 2014-10-05 MED ORDER — ARMC OPHTHALMIC DILATING GEL
1.0000 "application " | OPHTHALMIC | Status: DC | PRN
  Administered 2014-10-05: 1 via OPHTHALMIC

## 2014-10-05 MED ORDER — TIMOLOL MALEATE 0.5 % OP SOLN
OPHTHALMIC | Status: DC | PRN
  Administered 2014-10-05: 1 [drp] via OPHTHALMIC

## 2014-10-05 MED ORDER — NA HYALUR & NA CHOND-NA HYALUR 0.4-0.35 ML IO KIT
PACK | INTRAOCULAR | Status: DC | PRN
  Administered 2014-10-05: 1 mL via INTRAOCULAR

## 2014-10-05 MED ORDER — PROPARACAINE HCL 0.5 % OP SOLN: 1.0000 [drp] | Freq: Once | OPHTHALMIC | Status: DC

## 2014-10-05 MED ORDER — POVIDONE-IODINE 5 % OP SOLN
1.0000 "application " | OPHTHALMIC | Status: DC | PRN
  Administered 2014-10-05: 1 via OPHTHALMIC

## 2014-10-05 MED ORDER — CEFUROXIME OPHTHALMIC INJECTION 1 MG/0.1 ML
INJECTION | OPHTHALMIC | Status: DC | PRN
  Administered 2014-10-05: 0.1 mL via INTRACAMERAL

## 2014-10-05 MED ORDER — BRIMONIDINE TARTRATE 0.2 % OP SOLN
OPHTHALMIC | Status: DC | PRN
  Administered 2014-10-05: 1 [drp] via OPHTHALMIC

## 2014-10-05 MED ORDER — EPINEPHRINE HCL 1 MG/ML IJ SOLN
INTRAOCULAR | Status: DC | PRN
  Administered 2014-10-05: 70 mL via OPHTHALMIC

## 2014-10-05 SURGICAL SUPPLY — 26 items
CANNULA ANT/CHMB 27GA (MISCELLANEOUS) ×3 IMPLANT
GLOVE SURG LX 7.5 STRW (GLOVE) ×2
GLOVE SURG LX STRL 7.5 STRW (GLOVE) ×1 IMPLANT
GLOVE SURG TRIUMPH 8.0 PF LTX (GLOVE) ×3 IMPLANT
GOWN STRL REUS W/ TWL LRG LVL3 (GOWN DISPOSABLE) ×2 IMPLANT
GOWN STRL REUS W/TWL LRG LVL3 (GOWN DISPOSABLE) ×4
LENS IOL TECNIS 25.0 (Intraocular Lens) ×3 IMPLANT
LENS IOL TECNIS MONO 1P 25.0 (Intraocular Lens) ×1 IMPLANT
MARKER SKIN SURG W/RULER VIO (MISCELLANEOUS) ×3 IMPLANT
NDL RETROBULBAR .5 NSTRL (NEEDLE) IMPLANT
NEEDLE FILTER BLUNT 18X 1/2SAF (NEEDLE) ×2
NEEDLE FILTER BLUNT 18X1 1/2 (NEEDLE) ×1 IMPLANT
PACK CATARACT BRASINGTON (MISCELLANEOUS) ×3 IMPLANT
PACK EYE AFTER SURG (MISCELLANEOUS) ×3 IMPLANT
PACK OPTHALMIC (MISCELLANEOUS) ×3 IMPLANT
RING MALYGIN 7.0 (MISCELLANEOUS) IMPLANT
SUT ETHILON 10-0 CS-B-6CS-B-6 (SUTURE)
SUT VICRYL  9 0 (SUTURE)
SUT VICRYL 9 0 (SUTURE) IMPLANT
SUTURE EHLN 10-0 CS-B-6CS-B-6 (SUTURE) IMPLANT
SYR 3ML LL SCALE MARK (SYRINGE) ×3 IMPLANT
SYR 5ML LL (SYRINGE) IMPLANT
SYR TB 1ML LUER SLIP (SYRINGE) ×3 IMPLANT
WATER STERILE IRR 250ML POUR (IV SOLUTION) ×3 IMPLANT
WATER STERILE IRR 500ML POUR (IV SOLUTION) IMPLANT
WIPE NON LINTING 3.25X3.25 (MISCELLANEOUS) ×3 IMPLANT

## 2014-10-05 NOTE — Op Note (Signed)
OPERATIVE NOTE  [name] [id number] 10/05/2014   PREOPERATIVE DIAGNOSIS:  Nuclear sclerotic cataract left eye. H25.12   POSTOPERATIVE DIAGNOSIS:    Nuclear sclerotic cataract left eye.     PROCEDURE:  Phacoemusification with posterior chamber intraocular lens placement of the left eye   LENS:   Implant Name Type Inv. Item Serial No. Manufacturer Lot No. LRB No. Used  LENS IMPL INTRAOC [id number] 25.0 - [id number] Intraocular Lens LENS IMPL INTRAOC ZCB00 25.0 [id number] AMO   Left 1        ULTRASOUND TIME: 10.7  % of 0 minutes 57 seconds, CDE 6.1  SURGEON:  Wyonia Hough, MD   ANESTHESIA:  Topical with tetracaine drops and 2% Xylocaine jelly.   COMPLICATIONS:  None.   DESCRIPTION OF PROCEDURE:  The patient was identified in the holding room and transported to the operating room and placed in the supine position under the operating microscope.  The left eye was identified as the operative eye and it was prepped and draped in the usual sterile ophthalmic fashion.   A 1 millimeter clear-corneal paracentesis was made at the 1:30 position.  The anterior chamber was filled with Viscoat viscoelastic.  A 2.4 millimeter keratome was used to make a near-clear corneal incision at the 10:30 position.  .  A curvilinear capsulorrhexis was made with a cystotome and capsulorrhexis forceps.  Balanced salt solution was used to hydrodissect and hydrodelineate the nucleus.   Phacoemulsification was then used in stop and chop fashion to remove the lens nucleus and epinucleus.  The remaining cortex was then removed using the irrigation and aspiration handpiece. Provisc was then placed into the capsular bag to distend it for lens placement.  A lens was then injected into the capsular bag.  The remaining viscoelastic was aspirated.   Wounds were hydrated with balanced salt solution.  The anterior chamber was inflated to a physiologic pressure with balanced salt solution.  No wound leaks were noted.  Cefuroxime 0.1 ml of a 10mg /ml solution was injected into the anterior chamber for a dose of 1 mg of intracameral antibiotic at the completion of the case.   Timolol and Brimonidine drops were applied to the eye.  The patient was taken to the recovery room in stable condition without complications of anesthesia or surgery.  , 10/05/2014, 9:08 AM

## 2014-10-05 NOTE — Transfer of Care (Signed)
Immediate Anesthesia Transfer of Care Note  Patient: [name]  Procedure(s) Performed: Procedure(s): CATARACT EXTRACTION PHACO AND INTRAOCULAR LENS PLACEMENT (IOC) (Left)  Patient Location: PACU  Anesthesia Type: General  Level of Consciousness: awake, alert  and patient cooperative  Airway and Oxygen Therapy: Patient Spontanous Breathing and Patient connected to supplemental oxygen  Post-op Assessment: Post-op Vital signs reviewed, Patient's Cardiovascular Status Stable, Respiratory Function Stable, Patent Airway and No signs of Nausea or vomiting  Post-op Vital Signs: Reviewed and stable  Complications: No apparent anesthesia complications

## 2014-10-05 NOTE — Anesthesia Preprocedure Evaluation (Signed)
Anesthesia Evaluation  Patient identified by MRN, date of birth, ID band Patient awake    Reviewed: Allergy & Precautions, H&P , NPO status , Patient's Chart, lab work & pertinent test results, reviewed documented beta blocker date and time   Airway Mallampati: II  TM Distance: >3 FB Neck ROM: full    Dental no notable dental hx.    Pulmonary neg pulmonary ROS,    Pulmonary exam normal breath sounds clear to auscultation       Cardiovascular Exercise Tolerance: Good hypertension, + dysrhythmias Atrial Fibrillation  Rhythm:regular Rate:Normal     Neuro/Psych negative neurological ROS  negative psych ROS   GI/Hepatic Neg liver ROS, GERD  Medicated,  Endo/Other  negative endocrine ROS  Renal/GU negative Renal ROS  negative genitourinary   Musculoskeletal   Abdominal   Peds  Hematology negative hematology ROS (+)   Anesthesia Other Findings   Reproductive/Obstetrics negative OB ROS                             Anesthesia Physical Anesthesia Plan  ASA: II  Anesthesia Plan: General   Post-op Pain Management:    Induction:   Airway Management Planned:   Additional Equipment:   Intra-op Plan:   Post-operative Plan:   Informed Consent: I have reviewed the patients History and Physical, chart, labs and discussed the procedure including the risks, benefits and alternatives for the proposed anesthesia with the patient or authorized representative who has indicated his/her understanding and acceptance.     Plan Discussed with: CRNA  Anesthesia Plan Comments:         Anesthesia Quick Evaluation

## 2014-10-05 NOTE — H&P (Signed)
  The History and Physical notes were scanned in.  The patient remains stable and unchanged from the H&P.   Previous H&P reviewed, patient examined, and there are no changes.  , 10/05/2014 8:11 AM

## 2014-10-05 NOTE — Anesthesia Postprocedure Evaluation (Signed)
  Anesthesia Post-op Note  Patient: [name]  Procedure(s) Performed: Procedure(s): CATARACT EXTRACTION PHACO AND INTRAOCULAR LENS PLACEMENT (IOC) (Left)  Anesthesia type:General  Patient location: PACU  Post pain: Pain level controlled  Post assessment: Post-op Vital signs reviewed, Patient's Cardiovascular Status Stable, Respiratory Function Stable, Patent Airway and No signs of Nausea or vomiting  Post vital signs: Reviewed and stable  Last Vitals:  Filed Vitals:   10/05/14 0801  BP: 157/56  Pulse: 55  Temp: 37 C  Resp: 18    Level of consciousness: awake, alert  and patient cooperative  Complications: No apparent anesthesia complications

## 2014-10-06 ENCOUNTER — Encounter: Payer: Self-pay | Admitting: Ophthalmology

## 2014-10-07 ENCOUNTER — Other Ambulatory Visit: Payer: Self-pay | Admitting: Physician Assistant

## 2014-10-07 ENCOUNTER — Ambulatory Visit
Admission: RE | Admit: 2014-10-07 | Discharge: 2014-10-07 | Disposition: A | Discharge status: Home / Self Care | Payer: Medicare Other | Source: Ambulatory Visit | Attending: Physician Assistant | Admitting: Physician Assistant

## 2014-10-07 DIAGNOSIS — Z1231 Encounter for screening mammogram for malignant neoplasm of breast: Secondary | ICD-10-CM | POA: Diagnosis present

## 2015-05-30 ENCOUNTER — Other Ambulatory Visit: Payer: Self-pay | Admitting: Orthopedic Surgery

## 2015-05-30 DIAGNOSIS — Z96651 Presence of right artificial knee joint: Secondary | ICD-10-CM

## 2015-05-30 DIAGNOSIS — M25561 Pain in right knee: Secondary | ICD-10-CM

## 2015-06-08 ENCOUNTER — Encounter
Admission: RE | Admit: 2015-06-08 | Discharge: 2015-06-08 | Disposition: A | Discharge status: Home / Self Care | Payer: Medicare Other | Source: Ambulatory Visit | Attending: Orthopedic Surgery | Admitting: Orthopedic Surgery

## 2015-06-08 DIAGNOSIS — Z96651 Presence of right artificial knee joint: Secondary | ICD-10-CM | POA: Diagnosis present

## 2015-06-08 DIAGNOSIS — M25561 Pain in right knee: Secondary | ICD-10-CM | POA: Diagnosis not present

## 2015-06-08 MED ORDER — TECHNETIUM TC 99M MEDRONATE IV KIT
25.0000 | PACK | Freq: Once | INTRAVENOUS | Status: AC | PRN
  Administered 2015-06-08: 21.79 via INTRAVENOUS

## 2015-09-20 ENCOUNTER — Other Ambulatory Visit: Payer: Self-pay | Admitting: Physician Assistant

## 2015-09-20 DIAGNOSIS — Z1231 Encounter for screening mammogram for malignant neoplasm of breast: Secondary | ICD-10-CM

## 2015-10-11 ENCOUNTER — Ambulatory Visit
Admission: RE | Admit: 2015-10-11 | Discharge: 2015-10-11 | Disposition: A | Discharge status: Home / Self Care | Payer: Medicare Other | Source: Ambulatory Visit | Attending: Physician Assistant | Admitting: Physician Assistant

## 2015-10-11 ENCOUNTER — Other Ambulatory Visit: Payer: Self-pay | Admitting: Physician Assistant

## 2015-10-11 DIAGNOSIS — Z1231 Encounter for screening mammogram for malignant neoplasm of breast: Secondary | ICD-10-CM | POA: Insufficient documentation

## 2015-10-11 DIAGNOSIS — R928 Other abnormal and inconclusive findings on diagnostic imaging of breast: Secondary | ICD-10-CM | POA: Insufficient documentation

## 2015-10-12 ENCOUNTER — Other Ambulatory Visit: Payer: Self-pay | Admitting: Physician Assistant

## 2015-10-12 DIAGNOSIS — N6489 Other specified disorders of breast: Secondary | ICD-10-CM

## 2015-11-09 ENCOUNTER — Ambulatory Visit
Admission: RE | Admit: 2015-11-09 | Discharge: 2015-11-09 | Disposition: A | Discharge status: Home / Self Care | Payer: Medicare Other | Source: Ambulatory Visit | Attending: Physician Assistant | Admitting: Physician Assistant

## 2015-11-09 DIAGNOSIS — N6489 Other specified disorders of breast: Secondary | ICD-10-CM

## 2015-11-13 ENCOUNTER — Other Ambulatory Visit: Payer: Self-pay | Admitting: Physician Assistant

## 2015-11-13 DIAGNOSIS — N6002 Solitary cyst of left breast: Secondary | ICD-10-CM

## 2015-11-21 ENCOUNTER — Ambulatory Visit
Admission: RE | Admit: 2015-11-21 | Discharge: 2015-11-21 | Disposition: A | Discharge status: Home / Self Care | Payer: Medicare Other | Source: Ambulatory Visit | Attending: Physician Assistant | Admitting: Physician Assistant

## 2015-11-21 DIAGNOSIS — N6002 Solitary cyst of left breast: Secondary | ICD-10-CM | POA: Diagnosis present

## 2015-11-21 HISTORY — PX: BREAST CYST ASPIRATION: SHX578

## 2016-01-03 IMAGING — CT CT LUMBAR SPINE WITHOUT CONTRAST
3 of 9 series · 11 of 33 positions shown, 13 images · non-contrast
Comparison: None.

CLINICAL DATA: 73-year-old female with low back pain radiating and
to right hip and thigh.

EXAM:
CT LUMBAR SPINE WITHOUT CONTRAST
TECHNIQUE: Multidetector CT imaging of the lumbar spine was performed without
intravenous contrast administration. Multiplanar CT image
reconstructions were also generated.

[Series 5: l spine soft · axial · 0.28mm/px · z∈[-854,-714]mm · 3 of 123 slices shown, 4 images]
[im 35/123  soft-tissue]
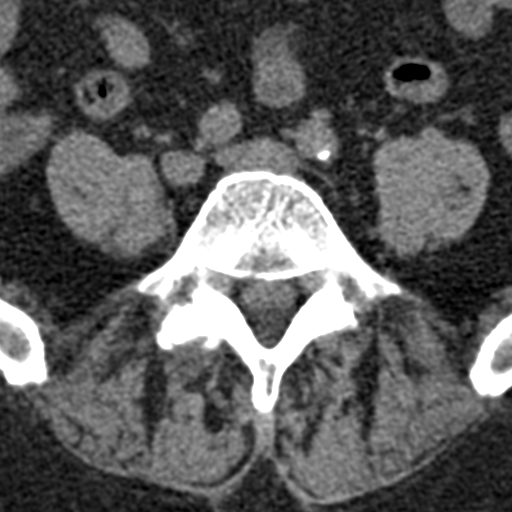
[im 35/123  bone]
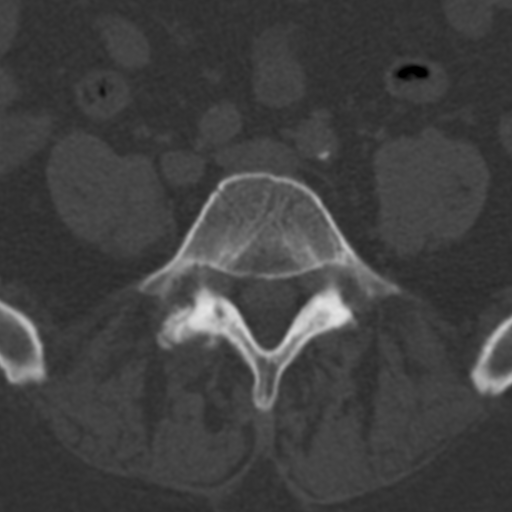
[im 70/123  bone]
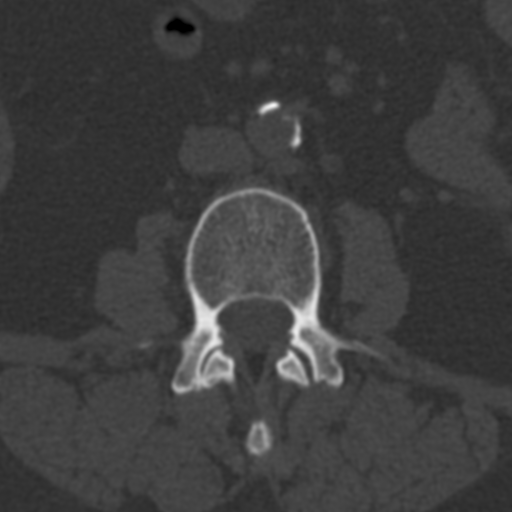
[im 105/123  bone]
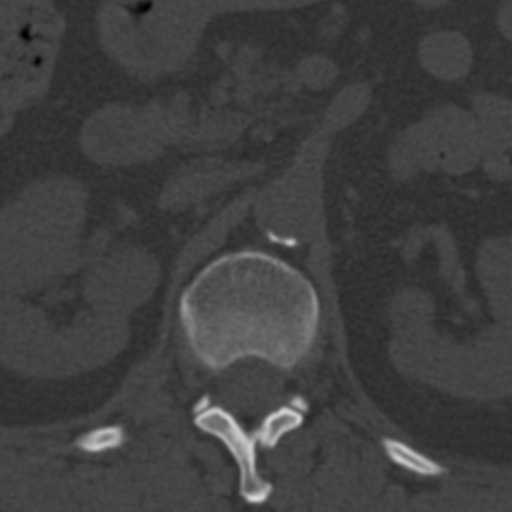

[Series 7: sagittal bone · sagittal · 0.28mm/px · 5 of 73 slices shown, 6 images]
[im 25/73  bone]
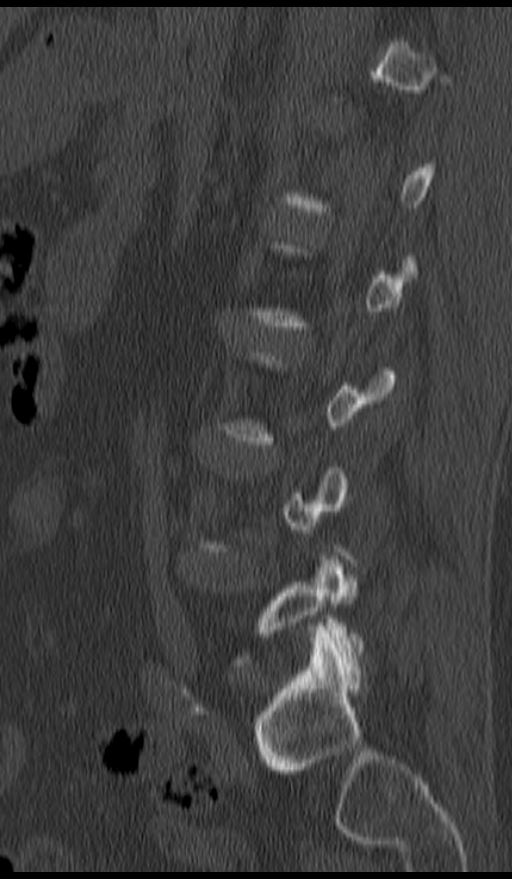
[im 31/73  bone]
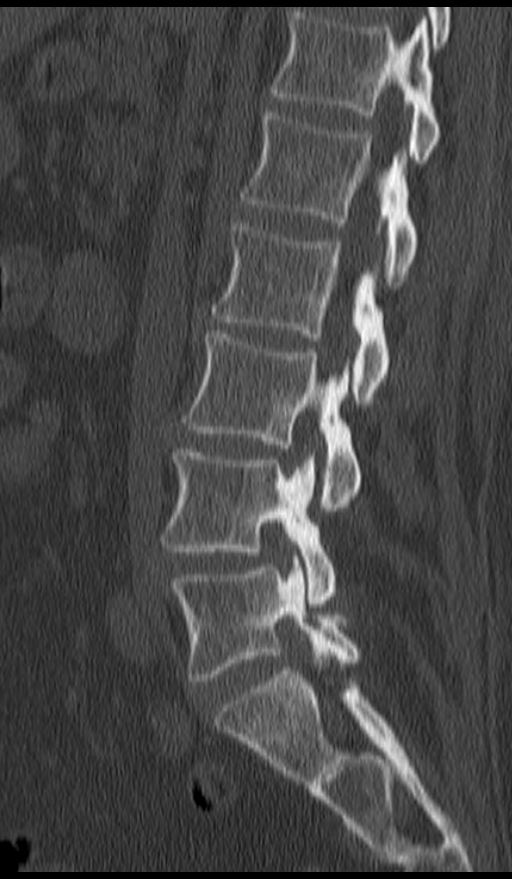
[im 37/73  soft-tissue]
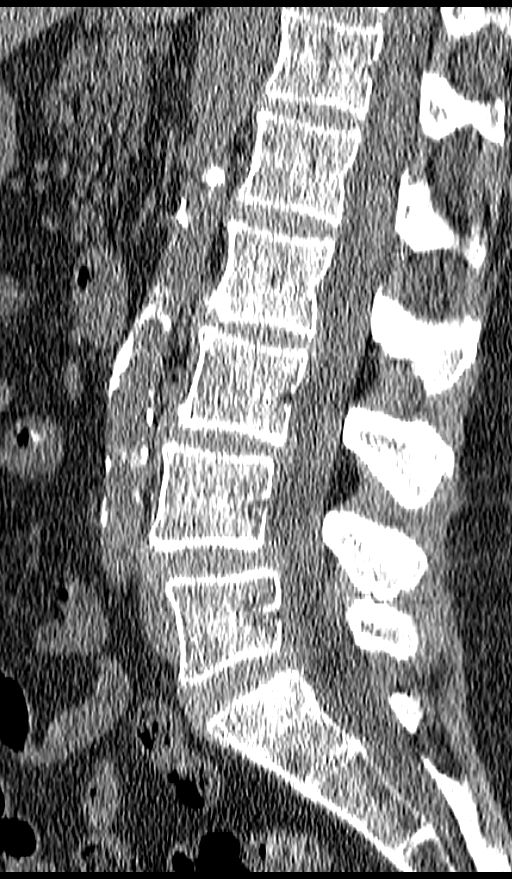
[im 37/73  bone]
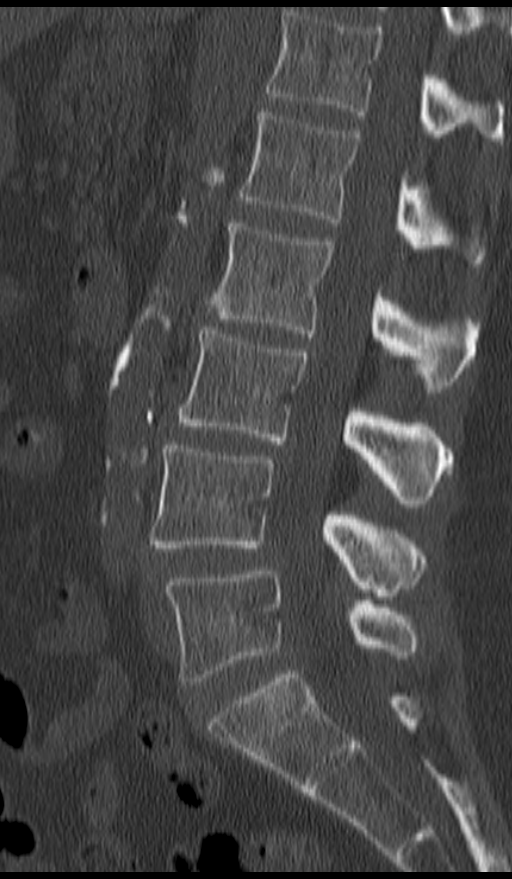
[im 43/73  bone]
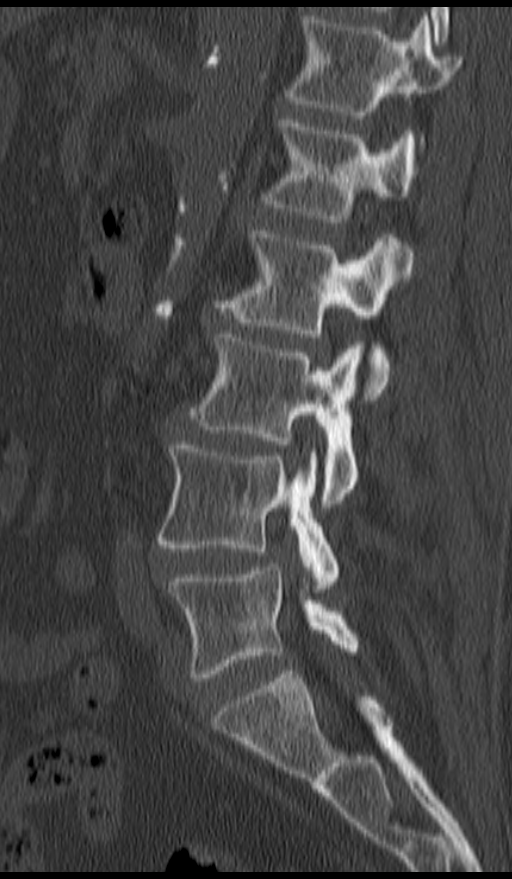
[im 49/73  bone]
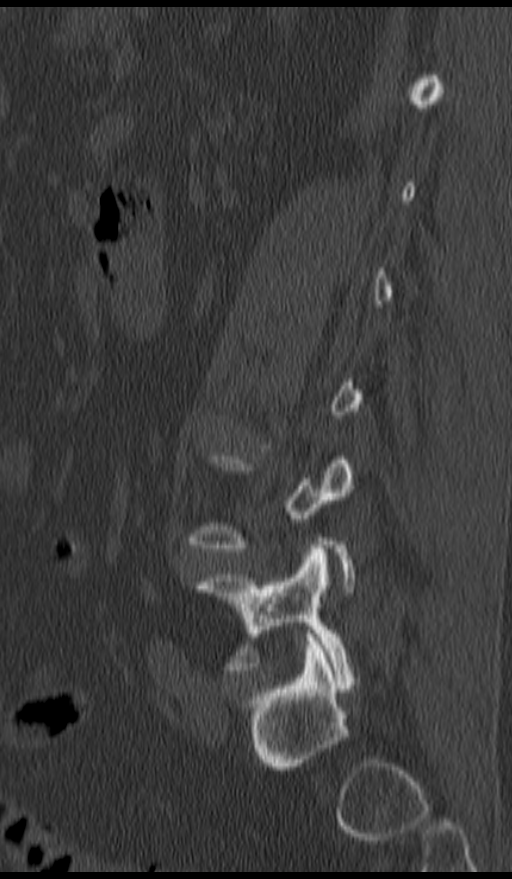

[Series 8: coronal bone · coronal · 0.28mm/px · 3 of 73 slices shown]
[im 15/73  bone]
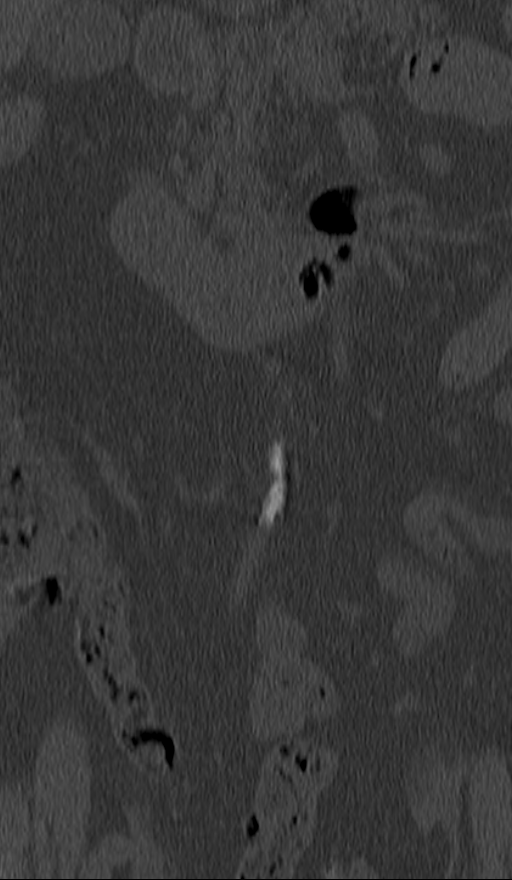
[im 29/73  bone]
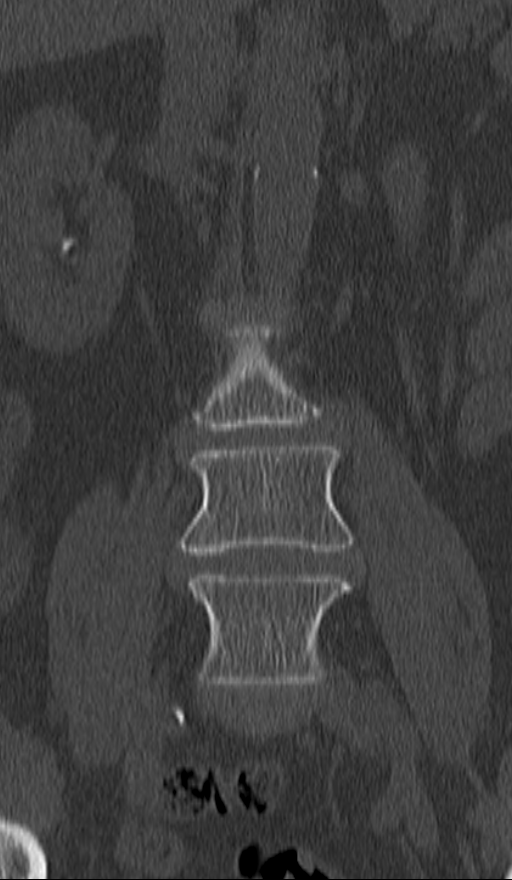
[im 44/73  bone]
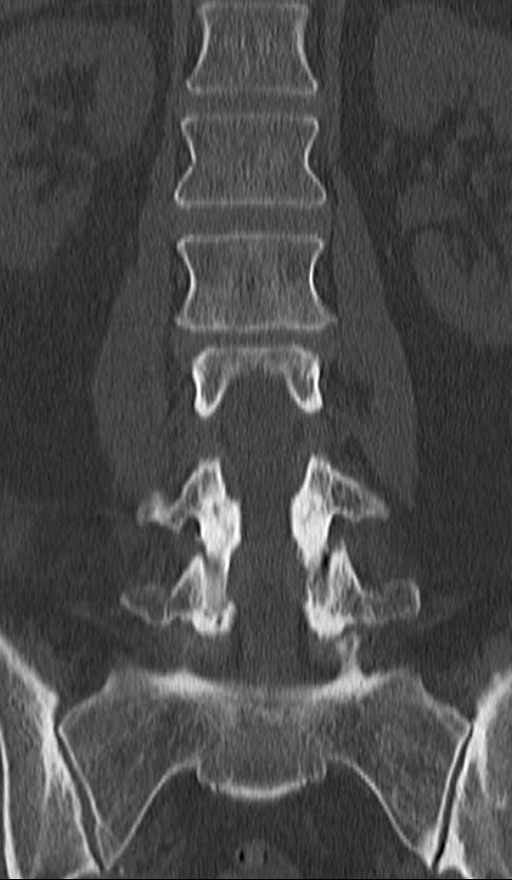

[11 of 33 positions shown; findings below may reference images not displayed]

FINDINGS: Five non rib-bearing lumbar type vertebra are identified.

There is no evidence of acute fracture.

T12-L1:  Unremarkable

L1-2:  Unremarkable

L2-3: Mild degenerative disc disease/disc space narrowing is noted
with mild broad-based disc bulge and mild ligamentum flavum
hypertrophy.

L3-4: Mild degenerative disc disease/ disc space narrowing with
mild-moderate broad-based disc bulge, slightly more focal in the
right lateral region noted which may contact the exiting right L3
nerve root. Ligamentum flavum hypertrophy contribute to mild lateral
recess narrowing.

L4-5: 5 mm anterolisthesis of L4 on 5 is identified. There is a
moderate-large diffuse disc bulge which may contact the exiting
right L4 nerve root. Facet arthropathy and ligamentum flavum
hypertrophy contribute to mild to moderate central spinal and
lateral recess narrowing.

L5-S1:  A mild broad-based disc bulge is noted.

Incidental note is made of a nonobstructing 6 mm right lower pole
renal calculus.
IMPRESSION: Degenerative changes as described above, with degenerative disc
disease, broad-based disc bulges and ligamentum flavum hypertrophy
at L3-4 and L4-5.

L3-4: More focal disc protrusion in the right lateral region at L3-4
appears to contact the exiting right L3 nerve root.

L4-5: Moderate -large broad-based disc bulge appears to contact the
exiting right L4 nerve root. Facet arthropathy/ ligamentum flavum
hypertrophy contribute to mild to moderate central spinal lateral
recess narrowing.

Nonobstructing right renal calculus.

## 2016-06-26 ENCOUNTER — Institutional Professional Consult (permissible substitution): Payer: Medicare Other | Admitting: Internal Medicine

## 2016-07-01 ENCOUNTER — Ambulatory Visit (INDEPENDENT_AMBULATORY_CARE_PROVIDER_SITE_OTHER): Payer: Medicare Other | Admitting: Internal Medicine

## 2016-07-01 ENCOUNTER — Encounter (INDEPENDENT_AMBULATORY_CARE_PROVIDER_SITE_OTHER): Payer: Self-pay

## 2016-07-01 ENCOUNTER — Encounter: Payer: Self-pay | Admitting: Internal Medicine

## 2016-07-01 VITALS — BP 128/80 | HR 61 | Ht 62.0 in | Wt 184.4 lb

## 2016-07-01 DIAGNOSIS — I481 Persistent atrial fibrillation: Secondary | ICD-10-CM

## 2016-07-01 DIAGNOSIS — I4819 Other persistent atrial fibrillation: Secondary | ICD-10-CM

## 2016-07-01 DIAGNOSIS — I480 Paroxysmal atrial fibrillation: Secondary | ICD-10-CM | POA: Diagnosis not present

## 2016-07-01 MED ORDER — ELIQUIS 5 MG PO TABS: 5.0000 mg | ORAL_TABLET | Freq: Two times a day (BID) | ORAL | 6 refills | Status: DC

## 2016-07-01 NOTE — Progress Notes (Signed)
Watchman Consult Note   Date:  07/01/2016   ID:  [name], DOB 08-28-1940, MRN [id number]  PCP:  Marinda Elk, MD  Cardiologist:  Erin Fulling Referring Physician: Erin Fulling   CC: to discuss Watchman implant    History of Present Illness: [name] is a 76 y.o. female referred by Dr Nehemiah Massed for evaluation of atrial fibrillation and stroke prevention. She has paroxysmal atrial fibrillation as well as hypertension, and prior rectal bleeding that did not require transfusion.  The patient has been evaluated by their referring physician and is felt to be a poor candidate for long term [place] due to rectal bleeding.  She therefore presents today for Watchman evaluation. She has mostly maintained SR on Multaq and done well. She noticed rectal bleeding in the setting of significantly increased stress around the time when her daughter was passing away.   Echo 09/2015 demonstrated EF 55%, mild MR, LA 41.   Today, she denies symptoms of palpitations, chest pain, shortness of breath, orthopnea, PND, lower extremity edema, claudication, dizziness, presyncope, syncope, bleeding, or neurologic sequela. The patient is tolerating medications without difficulties and is otherwise without complaint today.    Past Medical History:  Diagnosis Date  . Anxiety   . GERD (gastroesophageal reflux disease)   . Hypercholesteremia   . Hypertension   . Paroxysmal atrial fibrillation (HCC)   . Skin cancer    Past Surgical History:  Procedure Laterality Date  . ABDOMINAL HYSTERECTOMY    . BREAST BIOPSY Left 1973   benign  . BREAST LUMPECTOMY Left 1973   benign  . CATARACT EXTRACTION W/PHACO Right 07/13/2014   Procedure: CATARACT EXTRACTION PHACO AND INTRAOCULAR LENS PLACEMENT (IOC);  Surgeon: Leandrew Koyanagi, MD;  Location: [facility];  Service: Ophthalmology;  Laterality: Right;  . CATARACT EXTRACTION W/PHACO Left 10/05/2014   Procedure: CATARACT EXTRACTION PHACO AND INTRAOCULAR LENS  PLACEMENT (IOC);  Surgeon: Leandrew Koyanagi, MD;  Location: [place];  Service: Ophthalmology;  Laterality: Left;  . JOINT REPLACEMENT     knee replacement right  . SEPTOPLASTY       Current Outpatient Prescriptions  Medication Sig Dispense Refill  . amLODipine (NORVASC) 5 MG tablet Take 5 mg by mouth daily. PM    . aspirin EC 81 MG tablet Take 81 mg by mouth daily.    . cephALEXin (KEFLEX) 500 MG capsule as directed. Take 4 tabs by mouth 1 hr prior to dental procedure    . cholecalciferol (VITAMIN D) 1000 units tablet Take 2,000 Units by mouth daily.    . diphenhydramine-acetaminophen (TYLENOL PM) 25-500 MG TABS Take 1 tablet by mouth at bedtime.    . dronedarone (MULTAQ) 400 MG tablet Take 1 tablet by mouth 2 (two) times daily with a meal.    . escitalopram (LEXAPRO) 10 MG tablet Take 10 mg by mouth at bedtime.    Marland Kitchen estradiol (ESTRACE) 0.5 MG tablet Take 0.5 mg by mouth every morning.    Marland Kitchen HYDROcodone-acetaminophen (NORCO/VICODIN) 5-325 MG tablet Take 0.5-1 tablets by mouth 2 (two) times daily as needed. Pain    . Multiple Vitamin (MULTIVITAMIN) tablet Take 1 tablet by mouth every morning.    . Omega-3 Krill Oil 300 MG CAPS Take 350 mg by mouth every morning.    Marland Kitchen omeprazole (PRILOSEC) 20 MG capsule Take 20 mg by mouth 2 (two) times daily.    . pravastatin (PRAVACHOL) 40 MG tablet Take 40 mg by mouth at bedtime.    . traMADol Veatrice Bourbon)  50 MG tablet Take 0.5-1 tablets by mouth 3 (three) times daily as needed. Pain    . ELIQUIS 5 MG TABS tablet Take 1 tablet (5 mg total) by mouth 2 (two) times daily. 60 tablet 6   No current facility-administered medications for this visit.     Allergies:   Morphine; Morphine and related; and Oxycodone   Social History:  The patient  reports that she has never smoked. She has never used smokeless tobacco. She reports that she does not drink alcohol.   Family History:  The patient's family history includes CAD in her father; Dementia in  her mother; Heart attack in her father.    ROS:  Please see the history of present illness.   All other systems are reviewed and negative.    PHYSICAL EXAM: VS:  BP 128/80   Pulse 61   Ht 5\' 2"  (1.575 m)   Wt 184 lb 6.4 oz (83.6 kg)   SpO2 98%   BMI 33.73 kg/m  , BMI Body mass index is 33.73 kg/m. GEN: Well nourished, well developed, in no acute distress  HEENT: normal  Neck: no JVD, carotid bruits, or masses Cardiac: RRR; no murmurs, rubs, or gallops,no edema  Respiratory:  clear to auscultation bilaterally, normal work of breathing GI: soft, nontender, nondistended, + BS MS: no deformity or atrophy  Skin: warm and dry  Neuro:  Strength and sensation are intact Psych: euthymic mood, full affect  EKG:  EKG is ordered today. The ekg ordered today shows sinus rhythm   Wt Readings from Last 3 Encounters:  07/01/16 184 lb 6.4 oz (83.6 kg)  10/05/14 186 lb (84.4 kg)  07/13/14 182 lb (82.6 kg)    Other studies Reviewed: Additional studies/ records that were reviewed today include: Dr August Albino office notes   ASSESSMENT AND PLAN:  1.  Paroxysmal atrial fibrillation I have seen [name] is a 76 y.o. female in the office today who has been referred by Dr Nehemiah Massed for a Watchman left atrial appendage closure device.  She has a history of paroxysmal atrial fibrillation.  This patients CHA2DS2-VASc Score and unadjusted Ischemic Stroke Rate (% per year) is equal to 4.8 % stroke rate/year from a score of 4 which necessitates long term oral anticoagulation to prevent stroke. Unfortunately, She is not felt to be a long term Warfarin candidate secondary to rectal bleeding. Her bleeding occurred in the setting of significant stress related to her daughter's death. She did not have formal GI evaluation at that time. We have discussed options today. For now, would recommend re-trial of Eliquis. If she has recurrent bleeding, would obtain formal GI evaluation and consider Watchman if no  reversible source found. Stop ASA.    Follow-up:  With EP NP In 2 months  Current medicines are reviewed at length with the patient today.   The patient does not have concerns regarding her medicines.  The following changes were made today:  none  Labs/ tests ordered today include: none Orders Placed This Encounter  Procedures  . EKG 12-Lead     Signed, Thompson Grayer, MD  07/01/2016 2:58 PM     [place]  [address] [phone] (office) [phone] (fax)

## 2016-07-01 NOTE — Patient Instructions (Addendum)
Medication Instructions:  Your physician has recommended you make the following change in your medication:  1) Resume Eliquis 5 mg twice daily   Labwork: None ordered   Testing/Procedures: None ordered   Follow-Up: Your physician recommends that you schedule a follow-up appointment in: 2 months with Amber Seiler,NP   Any Other Special Instructions Will Be Listed Below (If Applicable).     If you need a refill on your cardiac medications before your next appointment, please call your pharmacy.

## 2016-09-02 ENCOUNTER — Encounter: Payer: Self-pay | Admitting: *Deleted

## 2016-09-19 ENCOUNTER — Ambulatory Visit: Payer: Medicare Other | Admitting: Nurse Practitioner

## 2016-10-03 ENCOUNTER — Other Ambulatory Visit: Payer: Self-pay | Admitting: Physician Assistant

## 2016-10-03 DIAGNOSIS — Z1239 Encounter for other screening for malignant neoplasm of breast: Secondary | ICD-10-CM

## 2016-10-03 DIAGNOSIS — Z0000 Encounter for general adult medical examination without abnormal findings: Secondary | ICD-10-CM

## 2017-01-23 ENCOUNTER — Other Ambulatory Visit: Payer: Self-pay | Admitting: Internal Medicine

## 2017-01-23 NOTE — Telephone Encounter (Signed)
Eliquis 5mg  refill request received; pt is 77 yrs old, wt-83.6kg, Crea-1.30 on 05/29/16 via [facility], pt last seen by Dr. Rayann Heman on 07/01/16; rx sent per request.

## 2017-01-31 ENCOUNTER — Other Ambulatory Visit: Payer: Self-pay | Admitting: Orthopedic Surgery

## 2017-01-31 DIAGNOSIS — M7581 Other shoulder lesions, right shoulder: Secondary | ICD-10-CM

## 2017-02-03 ENCOUNTER — Other Ambulatory Visit: Payer: Self-pay | Admitting: Physical Medicine and Rehabilitation

## 2017-02-03 ENCOUNTER — Other Ambulatory Visit: Payer: Self-pay | Admitting: Orthopedic Surgery

## 2017-02-03 DIAGNOSIS — M5416 Radiculopathy, lumbar region: Secondary | ICD-10-CM

## 2017-02-03 DIAGNOSIS — M7581 Other shoulder lesions, right shoulder: Secondary | ICD-10-CM

## 2017-02-21 ENCOUNTER — Ambulatory Visit
Admission: RE | Admit: 2017-02-21 | Discharge: 2017-02-21 | Disposition: A | Discharge status: Home / Self Care | Payer: Medicare Other | Source: Ambulatory Visit | Attending: Orthopedic Surgery | Admitting: Orthopedic Surgery

## 2017-02-21 ENCOUNTER — Ambulatory Visit
Admission: RE | Admit: 2017-02-21 | Discharge: 2017-02-21 | Disposition: A | Discharge status: Home / Self Care | Payer: Medicare Other | Source: Ambulatory Visit | Attending: Physical Medicine and Rehabilitation | Admitting: Physical Medicine and Rehabilitation

## 2017-02-21 DIAGNOSIS — M5416 Radiculopathy, lumbar region: Secondary | ICD-10-CM

## 2017-02-21 DIAGNOSIS — M7581 Other shoulder lesions, right shoulder: Secondary | ICD-10-CM

## 2017-07-28 ENCOUNTER — Other Ambulatory Visit: Payer: Self-pay | Admitting: Internal Medicine

## 2017-08-07 NOTE — Telephone Encounter (Signed)
Pt overdue for annual f/u with Dr Rayann Heman. Will send in rx with no refills and msg for pt to call clinic to schedule f/u. Will also send note to scheduling to help facilitate f/u visit.

## 2017-09-04 ENCOUNTER — Other Ambulatory Visit: Payer: Self-pay | Admitting: Physician Assistant

## 2017-09-04 DIAGNOSIS — Z1231 Encounter for screening mammogram for malignant neoplasm of breast: Secondary | ICD-10-CM

## 2017-10-01 ENCOUNTER — Ambulatory Visit
Admission: RE | Admit: 2017-10-01 | Discharge: 2017-10-01 | Disposition: A | Discharge status: Home / Self Care | Payer: Medicare Other | Source: Ambulatory Visit | Attending: Physician Assistant | Admitting: Physician Assistant

## 2017-10-01 DIAGNOSIS — Z1231 Encounter for screening mammogram for malignant neoplasm of breast: Secondary | ICD-10-CM | POA: Insufficient documentation

## 2018-06-05 ENCOUNTER — Emergency Department
Admission: EM | Admit: 2018-06-05 | Discharge: 2018-06-05 | Disposition: A | Discharge status: Home / Self Care | Payer: Medicare Other | Attending: Emergency Medicine | Admitting: Emergency Medicine

## 2018-06-05 ENCOUNTER — Other Ambulatory Visit: Payer: Self-pay

## 2018-06-05 ENCOUNTER — Encounter: Payer: Self-pay | Admitting: Emergency Medicine

## 2018-06-05 DIAGNOSIS — Z7901 Long term (current) use of anticoagulants: Secondary | ICD-10-CM | POA: Diagnosis not present

## 2018-06-05 DIAGNOSIS — I10 Essential (primary) hypertension: Secondary | ICD-10-CM | POA: Insufficient documentation

## 2018-06-05 DIAGNOSIS — R040 Epistaxis: Secondary | ICD-10-CM | POA: Diagnosis present

## 2018-06-05 DIAGNOSIS — Z79899 Other long term (current) drug therapy: Secondary | ICD-10-CM | POA: Diagnosis not present

## 2018-06-05 DIAGNOSIS — Z96651 Presence of right artificial knee joint: Secondary | ICD-10-CM | POA: Diagnosis not present

## 2018-06-05 DIAGNOSIS — Z7982 Long term (current) use of aspirin: Secondary | ICD-10-CM | POA: Insufficient documentation

## 2018-06-05 LAB — COMPREHENSIVE METABOLIC PANEL
ALT: 13 U/L (ref 0–44)
AST: 15 U/L (ref 15–41)
Albumin: 3.4 g/dL — ABNORMAL LOW (ref 3.5–5.0)
Alkaline Phosphatase: 42 U/L (ref 38–126)
Anion gap: 8 (ref 5–15)
BUN: 27 mg/dL — ABNORMAL HIGH (ref 8–23)
CO2: 23 mmol/L (ref 22–32)
Calcium: 8.4 mg/dL — ABNORMAL LOW (ref 8.9–10.3)
Chloride: 109 mmol/L (ref 98–111)
Creatinine, Ser: 1.17 mg/dL — ABNORMAL HIGH (ref 0.44–1.00)
GFR calc Af Amer: 51 mL/min — ABNORMAL LOW (ref >60)
GFR calc non Af Amer: 44 mL/min — ABNORMAL LOW (ref >60)
Glucose, Bld: 93 mg/dL (ref 70–99)
Potassium: 4.3 mmol/L (ref 3.5–5.1)
Sodium: 140 mmol/L (ref 135–145)
Total Bilirubin: 0.4 mg/dL (ref 0.3–1.2)
Total Protein: 6.5 g/dL (ref 6.5–8.1)

## 2018-06-05 LAB — CBC
HCT: 35.2 % — ABNORMAL LOW (ref 36.0–46.0)
Hemoglobin: 10.8 g/dL — ABNORMAL LOW (ref 12.0–15.0)
MCH: 28.6 pg (ref 26.0–34.0)
MCHC: 30.7 g/dL (ref 30.0–36.0)
MCV: 93.1 fL (ref 80.0–100.0)
Platelets: 296 10*3/uL (ref 150–400)
RBC: 3.78 MIL/uL — ABNORMAL LOW (ref 3.87–5.11)
RDW: 15.3 % (ref 11.5–15.5)
WBC: 10.5 10*3/uL (ref 4.0–10.5)
nRBC: 0 % (ref 0.0–0.2)

## 2018-06-05 LAB — PROTIME-INR
INR: 1.2 (ref 0.8–1.2)
Prothrombin Time: 15.2 seconds (ref 11.4–15.2)

## 2018-06-05 NOTE — ED Triage Notes (Addendum)
Nose bleed started this am.  Takes eloquis.  Very minimal bleeding currently.

## 2018-06-05 NOTE — ED Provider Notes (Signed)
[facility] Emergency Department Provider Note  Time seen: 10:26 AM  I have reviewed the triage vital signs and the nursing notes.   HISTORY  Chief Complaint Epistaxis   HPI [name] is a 78 y.o. female with a past medical history of anxiety, gastric reflux, hypertension, hyperlipidemia, paroxysmal atrial fibrillation, on anticoagulation presents to the emergency department for nosebleed.  According to the patient around 9:00 this morning she began experiencing a nosebleed.  No history of nosebleeds in the past.  Patient states she was not doing anything exertional, wiped her nose and noted blood on her hand.  Patient spoke to her neighbor who is a paramedic who recommended she use Afrin and clamp her nose.  Patient did this and it seemed to work but she was concerned so she came to the emergency department for evaluation.  Upon arrival patient's nosebleed has stopped, hemostatic.  Patient has no other complaints at this time.  Denies any fever cough congestion or shortness of breath.   Past Medical History:  Diagnosis Date  . Anxiety   . GERD (gastroesophageal reflux disease)   . Hypercholesteremia   . Hypertension   . Paroxysmal atrial fibrillation (HCC)   . Skin cancer     There are no active problems to display for this patient.   Past Surgical History:  Procedure Laterality Date  . ABDOMINAL HYSTERECTOMY    . BREAST CYST ASPIRATION Left 11/21/2015   resolved  . BREAST EXCISIONAL BIOPSY Left 1973   benign  . CATARACT EXTRACTION W/PHACO Right 07/13/2014   Procedure: CATARACT EXTRACTION PHACO AND INTRAOCULAR LENS PLACEMENT (IOC);  Surgeon: Leandrew Koyanagi, MD;  Location: [place];  Service: Ophthalmology;  Laterality: Right;  . CATARACT EXTRACTION W/PHACO Left 10/05/2014   Procedure: CATARACT EXTRACTION PHACO AND INTRAOCULAR LENS PLACEMENT (IOC);  Surgeon: Leandrew Koyanagi, MD;  Location: [place];  Service: Ophthalmology;   Laterality: Left;  . JOINT REPLACEMENT     knee replacement right  . SEPTOPLASTY      Prior to Admission medications   Medication Sig Start Date End Date Taking? Authorizing Provider  amLODipine (NORVASC) 5 MG tablet Take 5 mg by mouth daily. PM    [provider]  aspirin EC 81 MG tablet Take 81 mg by mouth daily.    [provider]  cephALEXin (KEFLEX) 500 MG capsule as directed. Take 4 tabs by mouth 1 hr prior to dental procedure 06/18/16   [provider]  cholecalciferol (VITAMIN D) 1000 units tablet Take 2,000 Units by mouth daily.    [provider]  diphenhydramine-acetaminophen (TYLENOL PM) 25-500 MG TABS Take 1 tablet by mouth at bedtime.    [provider]  dronedarone (MULTAQ) 400 MG tablet Take 1 tablet by mouth 2 (two) times daily with a meal. 09/28/15 09/27/16  [provider]  ELIQUIS 5 MG TABS tablet Take 1 tablet by mouth 2 times daily. Call clinic - overdue for appt with Dr Rayann Heman. 08/07/17   Allred, Jeneen Rinks, MD  escitalopram (LEXAPRO) 10 MG tablet Take 10 mg by mouth at bedtime.    [provider]  estradiol (ESTRACE) 0.5 MG tablet Take 0.5 mg by mouth every morning.    [provider]  HYDROcodone-acetaminophen (NORCO/VICODIN) 5-325 MG tablet Take 0.5-1 tablets by mouth 2 (two) times daily as needed. Pain 05/16/16   [provider]  Multiple Vitamin (MULTIVITAMIN) tablet Take 1 tablet by mouth every morning.    [provider]  Omega-3  Krill Oil 300 MG CAPS Take 350 mg by mouth every morning.    [provider]  omeprazole (PRILOSEC) 20 MG capsule Take 20 mg by mouth 2 (two) times daily.    [provider]  pravastatin (PRAVACHOL) 40 MG tablet Take 40 mg by mouth at bedtime. 02/02/16   [provider]  traMADol (ULTRAM) 50 MG tablet Take 0.5-1 tablets by mouth 3 (three) times daily as needed. Pain 05/10/16   [provider]    Allergies  Allergen Reactions   . Morphine Other (See Comments)    Disoriented  . Morphine And Related Other (See Comments)    hallucinations  . Oxycodone Other (See Comments)    Disoriented Hallucinations     Family History  Problem Relation Age of Onset  . Dementia Mother   . Heart attack Father   . CAD Father     Social History Social History   Tobacco Use  . Smoking status: Never Smoker  . Smokeless tobacco: Never Used  Substance Use Topics  . Alcohol use: No  . Drug use: Not on file    Review of Systems Constitutional: Negative for fever ENT: Positive for nosebleed, now resolved. Cardiovascular: Negative for chest pain. Respiratory: Negative for shortness of breath. Gastrointestinal: Negative for abdominal pain Musculoskeletal: Negative for musculoskeletal complaints Skin: Negative for skin complaints  Neurological: Negative for headache All other ROS negative  ____________________________________________   PHYSICAL EXAM:  VITAL SIGNS: ED Triage Vitals  Enc Vitals Group     BP 06/05/18 1003 (!) 160/53     Pulse Rate 06/05/18 1003 66     Resp 06/05/18 1003 18     Temp --      Temp src --      SpO2 06/05/18 1003 98 %     Weight --      Height --      Head Circumference --      Peak Flow --      Pain Score 06/05/18 0957 0     Pain Loc --      Pain Edu? --      Excl. in [place]? --    Constitutional: Alert and oriented. Well appearing and in no distress. Eyes: Normal exam ENT      Head: Normocephalic and atraumatic      Nose: Minimal amount of dried blood in the right nostril.  Hemostatic.  Normal-appearing left nostril.  No active bleeding.      Mouth/Throat: Mucous membranes are moist. Cardiovascular: Normal rate, regular rhythm.  Respiratory: Normal respiratory effort without tachypnea nor retractions. Breath sounds are clear Gastrointestinal: Soft and nontender. No distention.  Musculoskeletal: Nontender with normal range of motion in all extremities.  Neurologic:  Normal  speech and language. No gross focal neurologic deficits Skin:  Skin is warm, dry and intact.  Psychiatric: Mood and affect are normal.  ____________________________________________   INITIAL IMPRESSION / ASSESSMENT AND PLAN / ED COURSE  Pertinent labs & imaging results that were available during my care of the patient were reviewed by me and considered in my medical decision making (see chart for details).   Patient presents emergency department for a nosebleed from her right nostril starting around 9:00 this morning.  Prior to arrival patient had clamped her nose after using Afrin spray.  Upon arrival patient has no active bleeding.  We will check labs and monitor the patient in the emergency department to ensure that her nosebleed does not restart.  I discussed  precautions at home including Afrin spray nasal clamping for 15 minutes if symptoms recur, as well as return precautions.  Patient remains hemostatic.  Reassuring labs.  We will discharge home.  Patient agreeable to plan of care.  [name] was evaluated in Emergency Department on 06/05/2018 for the symptoms described in the history of present illness. She was evaluated in the context of the global COVID-19 pandemic, which necessitated consideration that the patient might be at risk for infection with the SARS-CoV-2 virus that causes COVID-19. Institutional protocols and algorithms that pertain to the evaluation of patients at risk for COVID-19 are in a state of rapid change based on information released by regulatory bodies including the CDC and federal and state organizations. These policies and algorithms were followed during the patient's care in the ED.  ____________________________________________   FINAL CLINICAL IMPRESSION(S) / ED DIAGNOSES  Epistaxis   Harvest Dark, MD 06/05/18 1125

## 2018-06-05 NOTE — ED Notes (Signed)
Pt continues to rest in bed with NAD at this time. No further nosebleed noted at this time. Will continue to monitor for further patient needs.

## 2018-06-05 NOTE — ED Notes (Signed)
NAD noted at time of D/C. Pt denies questions or concerns. Pt taken to lobby via wheelchair at this time.

## 2018-06-19 ENCOUNTER — Emergency Department: Payer: Medicare Other

## 2018-06-19 ENCOUNTER — Emergency Department
Admission: EM | Admit: 2018-06-19 | Discharge: 2018-06-20 | Disposition: A | Discharge status: Home / Self Care | Payer: Medicare Other | Attending: Emergency Medicine | Admitting: Emergency Medicine

## 2018-06-19 DIAGNOSIS — R1011 Right upper quadrant pain: Secondary | ICD-10-CM | POA: Diagnosis present

## 2018-06-19 DIAGNOSIS — Z7901 Long term (current) use of anticoagulants: Secondary | ICD-10-CM | POA: Diagnosis not present

## 2018-06-19 DIAGNOSIS — Z79899 Other long term (current) drug therapy: Secondary | ICD-10-CM | POA: Insufficient documentation

## 2018-06-19 DIAGNOSIS — I10 Essential (primary) hypertension: Secondary | ICD-10-CM | POA: Diagnosis not present

## 2018-06-19 DIAGNOSIS — N201 Calculus of ureter: Secondary | ICD-10-CM | POA: Insufficient documentation

## 2018-06-19 DIAGNOSIS — Z7982 Long term (current) use of aspirin: Secondary | ICD-10-CM | POA: Diagnosis not present

## 2018-06-19 DIAGNOSIS — Z96651 Presence of right artificial knee joint: Secondary | ICD-10-CM | POA: Insufficient documentation

## 2018-06-19 DIAGNOSIS — Z85820 Personal history of malignant melanoma of skin: Secondary | ICD-10-CM | POA: Insufficient documentation

## 2018-06-19 LAB — BASIC METABOLIC PANEL
Anion gap: 13 (ref 5–15)
BUN: 26 mg/dL — ABNORMAL HIGH (ref 8–23)
CO2: 20 mmol/L — ABNORMAL LOW (ref 22–32)
Calcium: 9.1 mg/dL (ref 8.9–10.3)
Chloride: 105 mmol/L (ref 98–111)
Creatinine, Ser: 1.49 mg/dL — ABNORMAL HIGH (ref 0.44–1.00)
GFR calc Af Amer: 38 mL/min — ABNORMAL LOW (ref >60)
GFR calc non Af Amer: 33 mL/min — ABNORMAL LOW (ref >60)
Glucose, Bld: 119 mg/dL — ABNORMAL HIGH (ref 70–99)
Potassium: 4.2 mmol/L (ref 3.5–5.1)
Sodium: 138 mmol/L (ref 135–145)

## 2018-06-19 LAB — CBC
HCT: 36.2 % (ref 36.0–46.0)
Hemoglobin: 11.7 g/dL — ABNORMAL LOW (ref 12.0–15.0)
MCH: 29.5 pg (ref 26.0–34.0)
MCHC: 32.3 g/dL (ref 30.0–36.0)
MCV: 91.2 fL (ref 80.0–100.0)
Platelets: 360 10*3/uL (ref 150–400)
RBC: 3.97 MIL/uL (ref 3.87–5.11)
RDW: 15.4 % (ref 11.5–15.5)
WBC: 14.7 10*3/uL — ABNORMAL HIGH (ref 4.0–10.5)
nRBC: 0 % (ref 0.0–0.2)

## 2018-06-19 LAB — URINALYSIS, COMPLETE (UACMP) WITH MICROSCOPIC
Bacteria, UA: NONE SEEN
Bilirubin Urine: NEGATIVE
Glucose, UA: NEGATIVE mg/dL
Ketones, ur: 5 mg/dL — AB
Nitrite: NEGATIVE
Protein, ur: 30 mg/dL — AB
RBC / HPF: >50 RBC/hpf — ABNORMAL HIGH (ref 0–5)
Specific Gravity, Urine: 1.026 (ref 1.005–1.030)
pH: 5 (ref 5.0–8.0)

## 2018-06-19 MED ORDER — ONDANSETRON HCL 4 MG/2ML IJ SOLN
4.0000 mg | Freq: Once | INTRAMUSCULAR | Status: AC
  Administered 2018-06-19: 4 mg via INTRAVENOUS
  Filled 2018-06-19: qty 2

## 2018-06-19 MED ORDER — KETOROLAC TROMETHAMINE 30 MG/ML IJ SOLN
15.0000 mg | Freq: Once | INTRAMUSCULAR | Status: AC
  Administered 2018-06-19: 23:00:00 15 mg via INTRAVENOUS
  Filled 2018-06-19: qty 1

## 2018-06-19 MED ORDER — ONDANSETRON 4 MG PO TBDP
4.0000 mg | ORAL_TABLET | Freq: Once | ORAL | Status: AC
  Administered 2018-06-19: 4 mg via ORAL
  Filled 2018-06-19: qty 1

## 2018-06-19 MED ORDER — ONDANSETRON 4 MG PO TBDP: 4.0000 mg | ORAL_TABLET | Freq: Three times a day (TID) | ORAL | 0 refills | Status: DC | PRN

## 2018-06-19 NOTE — ED Notes (Signed)
Patient took tramadol at 117[address] PTA with no relief of pain.

## 2018-06-19 NOTE — ED Triage Notes (Signed)
Patient c/o right flank pain beginning at 1400 and nausea. Patient denies urinary changes.

## 2018-06-19 NOTE — ED Provider Notes (Signed)
[facility] Emergency Department Provider Note   ____________________________________________    I have reviewed the triage vital signs and the nursing notes.   HISTORY  Chief Complaint Flank Pain     HPI [name] is a 78 y.o. female who presents with complaints of right flank pain.  Patient reports the pain started approximately 2 PM, she describes the pain is sharp and radiating from her groin to her right flank.  She does report history of chronic back pain but this feels different.  No history of kidney stones in the past.  She has taken 2 tramadol's with little improvement.  Does describe nausea as well.  No fevers or chills.  No dysuria.  Past Medical History:  Diagnosis Date  . Anxiety   . GERD (gastroesophageal reflux disease)   . Hypercholesteremia   . Hypertension   . Paroxysmal atrial fibrillation (HCC)   . Skin cancer     There are no active problems to display for this patient.   Past Surgical History:  Procedure Laterality Date  . ABDOMINAL HYSTERECTOMY    . BREAST CYST ASPIRATION Left 11/21/2015   resolved  . BREAST EXCISIONAL BIOPSY Left 1973   benign  . CATARACT EXTRACTION W/PHACO Right 07/13/2014   Procedure: CATARACT EXTRACTION PHACO AND INTRAOCULAR LENS PLACEMENT (IOC);  Surgeon: Leandrew Koyanagi, MD;  Location: [place];  Service: Ophthalmology;  Laterality: Right;  . CATARACT EXTRACTION W/PHACO Left 10/05/2014   Procedure: CATARACT EXTRACTION PHACO AND INTRAOCULAR LENS PLACEMENT (IOC);  Surgeon: Leandrew Koyanagi, MD;  Location: [place];  Service: Ophthalmology;  Laterality: Left;  . JOINT REPLACEMENT     knee replacement right  . SEPTOPLASTY      Prior to Admission medications   Medication Sig Start Date End Date Taking? Authorizing Provider  amLODipine (NORVASC) 5 MG tablet Take 5 mg by mouth daily. PM    [provider]  aspirin EC 81 MG tablet Take 81 mg by mouth daily.     [provider]  cephALEXin (KEFLEX) 500 MG capsule as directed. Take 4 tabs by mouth 1 hr prior to dental procedure 06/18/16   [provider]  cholecalciferol (VITAMIN D) 1000 units tablet Take 2,000 Units by mouth daily.    [provider]  diphenhydramine-acetaminophen (TYLENOL PM) 25-500 MG TABS Take 1 tablet by mouth at bedtime.    [provider]  dronedarone (MULTAQ) 400 MG tablet Take 1 tablet by mouth 2 (two) times daily with a meal. 09/28/15 09/27/16  [provider]  ELIQUIS 5 MG TABS tablet Take 1 tablet by mouth 2 times daily. Call clinic - overdue for appt with Dr Rayann Heman. 08/07/17   Allred, Jeneen Rinks, MD  escitalopram (LEXAPRO) 10 MG tablet Take 10 mg by mouth at bedtime.    [provider]  estradiol (ESTRACE) 0.5 MG tablet Take 0.5 mg by mouth every morning.    [provider]  HYDROcodone-acetaminophen (NORCO/VICODIN) 5-325 MG tablet Take 0.5-1 tablets by mouth 2 (two) times daily as needed. Pain 05/16/16   [provider]  Multiple Vitamin (MULTIVITAMIN) tablet Take 1 tablet by mouth every morning.    [provider]  Omega-3 Krill Oil 300 MG CAPS Take 350 mg by mouth every morning.    [provider]  omeprazole (PRILOSEC) 20 MG capsule Take 20 mg by mouth 2 (two) times daily.    [provider]  ondansetron (ZOFRAN ODT) 4 MG disintegrating tablet Take 1 tablet (  4 mg total) by mouth every 8 (eight) hours as needed. 06/19/18   Lavonia Drafts, MD  pravastatin (PRAVACHOL) 40 MG tablet Take 40 mg by mouth at bedtime. 02/02/16   [provider]  traMADol (ULTRAM) 50 MG tablet Take 0.5-1 tablets by mouth 3 (three) times daily as needed. Pain 05/10/16   [provider]     Allergies Morphine; Morphine and related; and Oxycodone  Family History  Problem Relation Age of Onset  . Dementia Mother   . Heart attack Father   . CAD Father     Social History Social History    Tobacco Use  . Smoking status: Never Smoker  . Smokeless tobacco: Never Used  Substance Use Topics  . Alcohol use: No  . Drug use: Not on file    Review of Systems  Constitutional: No fever/chills Eyes: No visual changes.  ENT: No sore throat. Cardiovascular: Denies chest pain. Respiratory: Denies shortness of breath. Gastrointestinal: As above Genitourinary: As above Musculoskeletal: Negative for back pain. Skin: Negative for rash. Neurological: Negative for headaches or weakness   ____________________________________________   PHYSICAL EXAM:  VITAL SIGNS: ED Triage Vitals  Enc Vitals Group     BP 06/19/18 1933 (!) 169/72     Pulse Rate 06/19/18 1933 79     Resp 06/19/18 1933 18     Temp 06/19/18 1933 97.9 F (36.6 C)     Temp src --      SpO2 06/19/18 1933 98 %     Weight 06/19/18 1932 79.4 kg (175 lb)     Height 06/19/18 1932 1.575 m (5\' 2" )     Head Circumference --      Peak Flow --      Pain Score 06/19/18 1931 10     Pain Loc --      Pain Edu? --      Excl. in [place]? --     Constitutional: Alert and oriented.  . Nose: No congestion/rhinnorhea. Mouth/Throat: Mucous membranes are moist.    Cardiovascular: Normal rate, regular rhythm. Grossly normal heart sounds.  Good peripheral circulation. Respiratory: Normal respiratory effort.  No retractions. Lungs CTAB. Gastrointestinal: Soft and nontender. No distention.  No CVA tenderness.  Musculoskeletal:   Warm and well perfused Neurologic:  Normal speech and language. No gross focal neurologic deficits are appreciated.  Skin:  Skin is warm, dry and intact. No rash noted. Psychiatric: Mood and affect are normal. Speech and behavior are normal.  ____________________________________________   LABS (all labs ordered are listed, but only abnormal results are displayed)  Labs Reviewed  URINALYSIS, COMPLETE (UACMP) WITH MICROSCOPIC - Abnormal; Notable for the following components:      Result Value   Color,  Urine YELLOW (*)    APPearance CLOUDY (*)    Hgb urine dipstick MODERATE (*)    Ketones, ur 5 (*)    Protein, ur 30 (*)    Leukocytes,Ua SMALL (*)    RBC / HPF >50 (*)    All other components within normal limits  BASIC METABOLIC PANEL - Abnormal; Notable for the following components:   CO2 20 (*)    Glucose, Bld 119 (*)    BUN 26 (*)    Creatinine, Ser 1.49 (*)    GFR calc non Af Amer 33 (*)    GFR calc Af Amer 38 (*)    All other components within normal limits  CBC - Abnormal; Notable for the following components:   WBC 14.7 (*)  Hemoglobin 11.7 (*)    All other components within normal limits   ____________________________________________  EKG   ____________________________________________  RADIOLOGY  CT scan demonstrates 3 mm distal ureteral calculus right UVJ ____________________________________________   PROCEDURES  Procedure(s) performed: No  Procedures   Critical Care performed: No ____________________________________________   INITIAL IMPRESSION / ASSESSMENT AND PLAN / ED COURSE  Pertinent labs & imaging results that were available during my care of the patient were reviewed by me and considered in my medical decision making (see chart for details).  Patient presents with flank pain as described above, suspicious for ureterolithiasis, versus urinary tract infection versus pyelonephritis.  We will obtain labs, give low-dose IV Toradol, obtain CT renal stone study and reevaluate.  She is feeling much better after IV Toradol, CT confirms 3 mm UVJ stone.  Lab work significant for elevated white blood cell count likely related to pain, urinalysis is contaminated does not appear consistent with UTI    ____________________________________________   FINAL CLINICAL IMPRESSION(S) / ED DIAGNOSES  Final diagnoses:  Ureterolithiasis        Note:  This document was prepared using Dragon voice recognition software and may include unintentional  dictation errors.   Lavonia Drafts, MD 06/19/18 2325

## 2018-09-07 ENCOUNTER — Other Ambulatory Visit: Payer: Self-pay | Admitting: Physician Assistant

## 2018-09-07 DIAGNOSIS — Z1231 Encounter for screening mammogram for malignant neoplasm of breast: Secondary | ICD-10-CM

## 2018-10-07 ENCOUNTER — Ambulatory Visit
Admission: RE | Admit: 2018-10-07 | Discharge: 2018-10-07 | Disposition: A | Discharge status: Home / Self Care | Payer: Medicare Other | Source: Ambulatory Visit | Attending: Physician Assistant | Admitting: Physician Assistant

## 2018-10-07 DIAGNOSIS — Z1231 Encounter for screening mammogram for malignant neoplasm of breast: Secondary | ICD-10-CM | POA: Insufficient documentation

## 2019-07-22 DIAGNOSIS — Z8616 Personal history of COVID-19: Secondary | ICD-10-CM

## 2019-07-22 HISTORY — DX: Personal history of COVID-19: Z86.16

## 2019-09-09 ENCOUNTER — Other Ambulatory Visit: Payer: Self-pay | Admitting: Physician Assistant

## 2019-09-09 DIAGNOSIS — Z1231 Encounter for screening mammogram for malignant neoplasm of breast: Secondary | ICD-10-CM

## 2019-10-08 ENCOUNTER — Ambulatory Visit
Admission: RE | Admit: 2019-10-08 | Discharge: 2019-10-08 | Disposition: A | Discharge status: Home / Self Care | Payer: Medicare Other | Source: Ambulatory Visit | Attending: Physician Assistant | Admitting: Physician Assistant

## 2019-10-08 DIAGNOSIS — Z1231 Encounter for screening mammogram for malignant neoplasm of breast: Secondary | ICD-10-CM | POA: Diagnosis not present

## 2019-12-20 ENCOUNTER — Other Ambulatory Visit: Payer: Self-pay | Admitting: Physical Medicine and Rehabilitation

## 2019-12-20 DIAGNOSIS — M5442 Lumbago with sciatica, left side: Secondary | ICD-10-CM

## 2019-12-20 DIAGNOSIS — M5416 Radiculopathy, lumbar region: Secondary | ICD-10-CM

## 2019-12-20 DIAGNOSIS — M5136 Other intervertebral disc degeneration, lumbar region: Secondary | ICD-10-CM

## 2019-12-20 DIAGNOSIS — M48062 Spinal stenosis, lumbar region with neurogenic claudication: Secondary | ICD-10-CM

## 2020-01-09 ENCOUNTER — Ambulatory Visit
Admission: RE | Admit: 2020-01-09 | Discharge: 2020-01-09 | Disposition: A | Discharge status: Home / Self Care | Payer: Medicare Other | Source: Ambulatory Visit | Attending: Physical Medicine and Rehabilitation | Admitting: Physical Medicine and Rehabilitation

## 2020-01-09 DIAGNOSIS — M5442 Lumbago with sciatica, left side: Secondary | ICD-10-CM

## 2020-01-09 DIAGNOSIS — M5416 Radiculopathy, lumbar region: Secondary | ICD-10-CM

## 2020-01-09 DIAGNOSIS — M5136 Other intervertebral disc degeneration, lumbar region: Secondary | ICD-10-CM

## 2020-01-09 DIAGNOSIS — M48062 Spinal stenosis, lumbar region with neurogenic claudication: Secondary | ICD-10-CM

## 2020-04-20 ENCOUNTER — Other Ambulatory Visit: Payer: Self-pay | Admitting: Neurosurgery

## 2020-05-03 ENCOUNTER — Encounter
Admission: RE | Admit: 2020-05-03 | Discharge: 2020-05-03 | Disposition: A | Discharge status: Home / Self Care | Payer: Medicare Other | Source: Ambulatory Visit | Attending: Neurosurgery | Admitting: Neurosurgery

## 2020-05-03 ENCOUNTER — Other Ambulatory Visit: Payer: Self-pay

## 2020-05-03 DIAGNOSIS — Z01812 Encounter for preprocedural laboratory examination: Secondary | ICD-10-CM | POA: Insufficient documentation

## 2020-05-03 LAB — URINALYSIS, ROUTINE W REFLEX MICROSCOPIC
Bilirubin Urine: NEGATIVE
Glucose, UA: NEGATIVE mg/dL
Hgb urine dipstick: NEGATIVE
Ketones, ur: NEGATIVE mg/dL
Nitrite: NEGATIVE
Protein, ur: NEGATIVE mg/dL
Specific Gravity, Urine: 1.026 (ref 1.005–1.030)
pH: 5 (ref 5.0–8.0)

## 2020-05-03 LAB — TYPE AND SCREEN
ABO/RH(D): A POS
Antibody Screen: NEGATIVE

## 2020-05-03 LAB — SURGICAL PCR SCREEN
MRSA, PCR: NEGATIVE
Staphylococcus aureus: NEGATIVE

## 2020-05-03 LAB — APTT: aPTT: 39 seconds — ABNORMAL HIGH (ref 24–36)

## 2020-05-03 LAB — PROTIME-INR
INR: 1.7 — ABNORMAL HIGH (ref 0.8–1.2)
Prothrombin Time: 20.3 seconds — ABNORMAL HIGH (ref 11.4–15.2)

## 2020-05-03 NOTE — Patient Instructions (Addendum)
Your procedure is scheduled on: 05/10/20 Report to [place]. To find out your arrival time please call [phone] between 1PM - 3PM on 05/09/20.  Remember: Instructions that are not followed completely may result in serious medical risk, up to and including death, or upon the discretion of your surgeon and anesthesiologist your surgery may need to be rescheduled.     _X__ 1. Do not eat food after midnight the night before your procedure.                 No gum chewing or hard candies. You may drink clear liquids up to 2 hours                 before you are scheduled to arrive for your surgery- DO not drink clear                 liquids within 2 hours of the start of your surgery.                 Clear Liquids include:  water, apple juice without pulp, clear carbohydrate                 drink such as Clearfast or Gatorade, Black Coffee or Tea (Do not add                 anything to coffee or tea). Diabetics water only  __X__2.  On the morning of surgery brush your teeth with toothpaste and water, you                 may rinse your mouth with mouthwash if you wish.  Do not swallow any              toothpaste of mouthwash.     _X__ 3.  No Alcohol for 24 hours before or after surgery.   _X__ 4.  Do Not Smoke or use e-cigarettes For 24 Hours Prior to Your Surgery.                 Do not use any chewable tobacco products for at least 6 hours prior to                 surgery.  ____  5.  Bring all medications with you on the day of surgery if instructed.   __X__  6.  Notify your doctor if there is any change in your medical condition      (cold, fever, infections).     Do not wear jewelry, make-up, hairpins, clips or nail polish. Do not wear lotions, powders, or perfumes.  Do not shave 48 hours prior to surgery. Men may shave face and neck. Do not bring valuables to the hospital.    [facility] is not responsible for any belongings or  valuables.  Contacts, dentures/partials or body piercings may not be worn into surgery. Bring a case for your contacts, glasses or hearing aids, a denture cup will be supplied. Leave your suitcase in the car. After surgery it may be brought to your room. For patients admitted to the hospital, discharge time is determined by your treatment team.   Patients discharged the day of surgery will not be allowed to drive home.   Please read over the following fact sheets that you were given:   MRSA Information, chg soap  __X__ Take these medicines the morning of surgery with A SIP OF WATER:  1. amLODipine (NORVASC) 5 MG tablet  2. baclofen (LIORESAL) 10 MG tablet  3. dronedarone (MULTAQ) 400 MG tablet  4. Omeprazole/prilosec  5.  6.  ____ Fleet Enema (as directed)   __X__ Use CHG Soap/SAGE wipes as directed  ____ Use inhalers on the day of surgery  ____ Stop metformin/Janumet/Farxiga 2 days prior to surgery    ____ Take 1/2 of usual insulin dose the night before surgery. No insulin the morning          of surgery.   ____ Stop Blood Thinners Coumadin/Plavix/Xarelto/Pleta/Pradaxa/Eliquis/Effient/Aspirin  on   Or contact your Surgeon, Cardiologist or Medical Doctor regarding  ability to stop your blood thinners  __X__ Stop Anti-inflammatories 7 days before surgery such as Advil, Ibuprofen, Motrin,  BC or Goodies Powder, Naprosyn, Naproxen, Aleve, Aspirin    __X__ Stop all herbal supplements, fish oil or vitamin E until after surgery.    ____ Bring C-Pap to the hospital.    YOU MAY HOLD YOUR ELIQUIS FOR 72 HOURS (3 DAYS) PRIOR TO YOUR PROCEDURE ON 05/10/20

## 2020-05-04 ENCOUNTER — Other Ambulatory Visit: Payer: Self-pay | Admitting: Urgent Care

## 2020-05-06 ENCOUNTER — Telehealth: Payer: Self-pay | Admitting: Urgent Care

## 2020-05-06 LAB — URINE CULTURE: Culture: >=100000 — AB

## 2020-05-06 NOTE — Progress Notes (Signed)
  [facility] Perioperative Services: Pre-Admission/Anesthesia Testing  Abnormal Lab Notification   Date: 05/06/20  Name: [name] MRN:   [id number]  Re: Abnormal labs noted during PAT appointment   Provider(s) Notified: Meade Maw, MD Notification mode: Routed and/or faxed via [facility]   ABNORMAL LAB VALUE(S): Lab Results  Component Value Date   COLORURINE AMBER (A) 05/03/2020   APPEARANCEUR HAZY (A) 05/03/2020   LABSPEC 1.026 05/03/2020   PHURINE 5.0 05/03/2020   GLUCOSEU NEGATIVE 05/03/2020   HGBUR NEGATIVE 05/03/2020   BILIRUBINUR NEGATIVE 05/03/2020   KETONESUR NEGATIVE 05/03/2020   PROTEINUR NEGATIVE 05/03/2020   NITRITE NEGATIVE 05/03/2020   LEUKOCYTESUR SMALL (A) 05/03/2020   EPIU 6-10 05/03/2020   WBCU 0-5 05/03/2020   RBCU 0-5 05/03/2020   BACTERIA MANY (A) 05/03/2020   Lab Results  Component Value Date   CULT (A) 05/04/2020    >=100,000 COLONIES/mL LACTOBACILLUS SPECIES Standardized susceptibility testing for this organism is not available. Performed at [facility], [place] [address], [place], [place] [address]    Notes:  Patient scheduled for L3-L5 SPINAL DECOMPRESSION on 05/11/2018.    UA performed in PAT concerning for infection  . Urine C&S added to assess for pathogenically significant growth.   Culture grew out Lactobacillis, which is a pathogen that is typically seen in the normal vaginal flora. In review of her UA, the patient had 6-10 SE/LPF leading me to believe that this represents sample contamination with a colonized vaginal pathogen.   In the setting of a patient with asymptomatic bacteruria (at the time sample was collected), will defer treatment at this time. Will forward UA and culture results to patient's surgeon for review. MD may elect to proceed with short course of prophylactic/empiric treatment to abate any true infection in this patient prior to her upcoming spinal decompression procedure.   This  is a Community education officer; no formal response is required.  Honor Loh, MSN, APRN, FNP-C, CEN [facility]  Peri-operative Services Nurse Practitioner Phone: [phone] Fax: [phone] 05/06/20 4:04 PM

## 2020-05-08 ENCOUNTER — Encounter: Payer: Self-pay | Admitting: Neurosurgery

## 2020-05-08 ENCOUNTER — Other Ambulatory Visit: Admission: RE | Admit: 2020-05-08 | Payer: Medicare Other | Source: Ambulatory Visit

## 2020-05-08 NOTE — Progress Notes (Signed)
Perioperative Services  Pre-Admission/Anesthesia Testing Clinical Review  Date: 05/08/20  Patient Demographics:  Name: [name] DOB:   [birth year] MRN:   [id number]  Planned Surgical Procedure(s):    Case: [id number] Date/Time: 05/10/20 1333   Procedure: L3-5 DECOMPRESSION (N/A )   Anesthesia type: General   Pre-op diagnosis: Neurogenic claudication due to lumbar spinal stenosis M48.062   Location: [facility] OR ROOM 03 / [place] [facility]   Surgeons: Meade Maw, MD    NOTE: Available PAT nursing documentation and vital signs have been reviewed. Clinical nursing staff has updated patient's PMH/PSHx, current medication list, and drug allergies/intolerances to ensure comprehensive history available to assist in medical decision making as it pertains to the aforementioned surgical procedure and anticipated anesthetic course.   Clinical Discussion:  [name] is a 80 y.o. female who is submitted for pre-surgical anesthesia review and clearance prior to her undergoing the above procedure. Patient has never been a smoker. Pertinent PMH includes: paroxysmal atrial fibrillation, aortic atherosclerosis, HTN, HLD, GERD (on daily PPI), IDA, anxiety  Patient is followed by cardiology Nehemiah Massed, MD). She was last seen in the cardiology clinic on 05/01/2020; notes reviewed.  At the time of her clinic visit, patient was complaining of shortness of breath that was primarily exertional in nature.  She denied any chest pain, PND, orthopnea, palpitations, peripheral edema, vertiginous symptoms, or presyncope/syncope. Patient with a PMH significant for PAF. CHA2DS2-VASc Score = 5 (age x 2, sex, HTN, aortic plaque). Patient chronically anticoagulated using apixaban; compliant with therapy with no evidence of GI bleeding. Last TTE performed on 04/07/200 revealed normal left ventricular systolic function with trivial to mild valvular insufficiency; LVEF 50% (see full interpretation of  cardiovascular testing below). Patient on GDMT for her HTN and HLD diagnoses.  Blood pressure well controlled at 128/66 on currently prescribed CCB monotherapy.  Patient is on a statin for her HLD. Functional capacity, as defined by DASI, is documented as being </= 4 METS.  No changes were made to patient's medication regimen.  Patient to follow-up with outpatient cardiology in 9 months or sooner if needed.  Patient is scheduled for a spinal decompression procedure on 05/10/2020 with Dr. Meade Maw.  Given patient's past medical history significant for cardiovascular diagnoses, presurgical cardiac clearance was sought by the attending surgeons office and PAT team.  Per cardiology, "the patient is at the lowest risk possible for cardiovascular complications.  Overall risk of cardiac complication with surgery is felt to be low (<1%)". Again, this patient is on daily anticoagulation therapy. She has been instructed on recommendations for holding her apixaban for 3 days prior to her procedure with plans to restart as soon as postoperative bleeding risk felt to be minimized by her attending surgeon. The patient has been instructed that her last dose of her anticoagulant will be on 05/06/2020.  Patient denies previous perioperative complications with anesthesia in the past. In review of the available records, it is noted that patient underwent a general anesthetic course at [facility] (ASA II) in 09/2014 without documented complications.   Vitals with BMI 05/03/2020 06/20/2018 06/19/2018  Height 5\' 2"  - -  Weight 179 lbs - -  BMI 33.82 - -  Systolic 505 397 673  Diastolic 52 54 90  Pulse 65 61 77    Providers/Specialists:   NOTE: Primary physician provider listed below. Patient may have been seen by APP or partner within same practice.   PROVIDER ROLE / SPECIALTY LAST Loni Dolly,  Ardyth Gal, MD  Neurosurgery  04/06/2020  Marinda Elk, MD  Primary Care Provider  03/13/2020   Serafina Royals, MD  Cardiology  05/01/2020   Allergies:  Aspirin, Nsaids, Morphine and related, and Oxycodone  Current Home Medications:   No current facility-administered medications for this encounter.   Marland Kitchen amLODipine (NORVASC) 5 MG tablet  . baclofen (LIORESAL) 10 MG tablet  . cholecalciferol (VITAMIN D) 1000 units tablet  . dronedarone (MULTAQ) 400 MG tablet  . ELIQUIS 5 MG TABS tablet  . escitalopram (LEXAPRO) 10 MG tablet  . estradiol (ESTRACE) 0.5 MG tablet  . HYDROcodone-acetaminophen (NORCO/VICODIN) 5-325 MG tablet  . meclizine (ANTIVERT) 25 MG tablet  . Multiple Vitamin (MULTIVITAMIN) tablet  . omeprazole (PRILOSEC) 20 MG capsule  . pravastatin (PRAVACHOL) 40 MG tablet   History:   Past Medical History:  Diagnosis Date  . Anxiety   . Aortic atherosclerosis ([place])   . Current use of long term anticoagulation    APIXABAN  . GERD (gastroesophageal reflux disease)   . History of 2019 novel coronavirus disease (COVID-19) 07/2019  . Hypercholesteremia   . Hypertension   . IDA (iron deficiency anemia)   . Paroxysmal atrial fibrillation (HCC)   . Skin cancer    Past Surgical History:  Procedure Laterality Date  . ABDOMINAL HYSTERECTOMY    . BREAST CYST ASPIRATION Left 11/21/2015   resolved  . BREAST EXCISIONAL BIOPSY Left 1973   benign  . CATARACT EXTRACTION W/PHACO Right 07/13/2014   Procedure: CATARACT EXTRACTION PHACO AND INTRAOCULAR LENS PLACEMENT (IOC);  Surgeon: Leandrew Koyanagi, MD;  Location: [place];  Service: Ophthalmology;  Laterality: Right;  . CATARACT EXTRACTION W/PHACO Left 10/05/2014   Procedure: CATARACT EXTRACTION PHACO AND INTRAOCULAR LENS PLACEMENT (IOC);  Surgeon: Leandrew Koyanagi, MD;  Location: [place];  Service: Ophthalmology;  Laterality: Left;  . JOINT REPLACEMENT     knee replacement right  . SEPTOPLASTY     Family History  Problem Relation Age of Onset  . Dementia Mother   . Heart attack Father   . CAD  Father    Social History   Tobacco Use  . Smoking status: Never Smoker  . Smokeless tobacco: Never Used  Vaping Use  . Vaping Use: Never used  Substance Use Topics  . Alcohol use: No  . Drug use: Never    Pertinent Clinical Results:  LABS: Labs reviewed: Acceptable for surgery.      Orders Only on 05/04/2020  Component Date Value Ref Range Status  . Specimen Description 05/04/2020    Final                   Value:URINE, RANDOM Performed at [facility], [address] [place], [place] [address]   . Special Requests 05/04/2020    Final                   Value:NONE Performed at [facility], [address] [place], [place] [address]   . Culture 05/04/2020 *  Final                   Value:>=100,000 COLONIES/mL LACTOBACILLUS SPECIES Standardized susceptibility testing for this organism is not available. Performed at [facility], [place] [address] [place],  [address]   . Report Status 05/04/2020 05/06/2020 FINAL   Final  Hospital Outpatient Visit on 05/03/2020  Component Date Value Ref Range Status  . aPTT 05/03/2020 39* 24 - 36 seconds Final  Comment:        IF BASELINE aPTT IS ELEVATED, SUGGEST PATIENT RISK ASSESSMENT BE USED TO DETERMINE APPROPRIATE ANTICOAGULANT THERAPY. Performed at [facility], [address], [place], [place] [address]   . Prothrombin Time 05/03/2020 20.3* 11.4 - 15.2 seconds Final  . INR 05/03/2020 1.7* 0.8 - 1.2 Final   Comment: (NOTE) INR goal varies based on device and disease states. Performed at [facility], [address], [place], [place] [address]   . MRSA, PCR 05/03/2020 NEGATIVE  NEGATIVE Final  . Staphylococcus aureus 05/03/2020 NEGATIVE  NEGATIVE Final   Comment: (NOTE) The Xpert SA Assay (FDA approved for NASAL specimens in patients 70 years of age and older), is one component of a comprehensive surveillance program. It is not intended to diagnose infection nor  to guide or monitor treatment. Performed at [facility], [address] [place], [place] [address]   . ABO/RH(D) 05/03/2020 A POS   Final  . Antibody Screen 05/03/2020 NEG   Final  . Sample Expiration 05/03/2020 05/17/2020,2359   Final  . Extend sample reason 05/03/2020    Final                   Value:NO TRANSFUSIONS OR PREGNANCY IN THE PAST 3 MONTHS Performed at [facility], [address] [place], [place] [address]   . Color, Urine 05/03/2020 AMBER* YELLOW Final   BIOCHEMICALS MAY BE AFFECTED BY COLOR  . APPearance 05/03/2020 HAZY* CLEAR Final  . Specific Gravity, Urine 05/03/2020 1.026  1.005 - 1.030 Final  . pH 05/03/2020 5.0  5.0 - 8.0 Final  . Glucose, UA 05/03/2020 NEGATIVE  NEGATIVE mg/dL Final  . Hgb urine dipstick 05/03/2020 NEGATIVE  NEGATIVE Final  . Bilirubin Urine 05/03/2020 NEGATIVE  NEGATIVE Final  . Ketones, ur 05/03/2020 NEGATIVE  NEGATIVE mg/dL Final  . Protein, ur 05/03/2020 NEGATIVE  NEGATIVE mg/dL Final  . Nitrite 05/03/2020 NEGATIVE  NEGATIVE Final  . Chalmers Guest 05/03/2020 SMALL* NEGATIVE Final  . RBC / HPF 05/03/2020 0-5  0 - 5 RBC/hpf Final  . WBC, UA 05/03/2020 0-5  0 - 5 WBC/hpf Final  . Bacteria, UA 05/03/2020 MANY* NONE SEEN Final  . Squamous Epithelial / LPF 05/03/2020 6-10  0 - 5 Final  . Mucus 05/03/2020 PRESENT   Final  . Hyaline Casts, UA 05/03/2020 PRESENT   Final   Performed at [facility], [address] [place], [place] [address]     ECG: Date: 03/16/2020 Rate: 72 bpm Rhythm: normal sinus Axis (leads I and aVF): Left axis deviation Intervals: PR 190 ms. QRS 104 ms. QTc 483 ms. ST segment and T wave changes: No evidence of acute ST segment elevation or depression Comparison: Similar to previous tracing obtained on 08/31/2017 NOTE: Tracing obtained at [facility]; unable for review. Above based on cardiologist's interpretation.    IMAGING / PROCEDURES: ECHOCARDIOGRAM performed on  04/27/2020 1. LVEF 50% 2. Normal left jugular systolic function 3. Normal right ventricular systolic function 4. Trivial MR and TR 5. Mild AR 6. No PR 7. No valvular stenosis 8. No evidence of pericardial effusion  MRI LUMBAR SPINE WITHOUT CONTRAST performed on 01/09/2020 1. Multilevel spondylosis that has progressed since prior exam 2. Moderate spinal canal and moderate to severe bilateral neuroforaminal narrowing at the L2-L3 and L3-L4 levels 3. Moderate spinal canal and moderate right neuroforaminal narrowing at the L4-L5 level  Impression and Plan:  [name] has been referred for pre-anesthesia review and  clearance prior to her undergoing the planned anesthetic and procedural courses. Available labs, pertinent testing, and imaging results were personally reviewed by me. This patient has been appropriately cleared by cardiology with an overall LOW risk of significant perioperative cardiovascular complications.  Based on clinical review performed today (05/08/20), barring any significant acute changes in the patient's overall condition, it is anticipated that she will be able to proceed with the planned surgical intervention. Any acute changes in clinical condition may necessitate her procedure being postponed and/or cancelled. Patient will meet with anesthesia team (MD and/or CRNA) on this day of her procedure for preoperative evaluation/assessment.   Pre-surgical instructions were reviewed with the patient during her PAT appointment and questions were fielded by PAT clinical staff. Patient was advised that if any questions or concerns arise prior to her procedure then she should return a call to PAT and/or her surgeon's office to discuss.  Honor Loh, MSN, APRN, FNP-C, CEN [facility]  Peri-operative Services Nurse Practitioner Phone: [phone] 05/08/20 8:55 AM  NOTE: This note has been prepared using Dragon dictation software. Despite my best ability to  proofread, there is always the potential that unintentional transcriptional errors may still occur from this process.

## 2020-05-10 ENCOUNTER — Other Ambulatory Visit: Payer: Self-pay

## 2020-05-10 ENCOUNTER — Ambulatory Visit
Admission: RE | Admit: 2020-05-10 | Discharge: 2020-05-10 | Disposition: A | Discharge status: Home / Self Care | Payer: Medicare Other | Attending: Neurosurgery | Admitting: Neurosurgery

## 2020-05-10 ENCOUNTER — Ambulatory Visit: Payer: Medicare Other | Admitting: Urgent Care

## 2020-05-10 ENCOUNTER — Encounter
Admission: RE | Disposition: A | Discharge status: Home / Self Care | Payer: Self-pay | Source: Home / Self Care | Attending: Neurosurgery

## 2020-05-10 ENCOUNTER — Encounter: Payer: Self-pay | Admitting: Neurosurgery

## 2020-05-10 ENCOUNTER — Ambulatory Visit: Payer: Medicare Other

## 2020-05-10 DIAGNOSIS — Z8616 Personal history of COVID-19: Secondary | ICD-10-CM | POA: Insufficient documentation

## 2020-05-10 DIAGNOSIS — K219 Gastro-esophageal reflux disease without esophagitis: Secondary | ICD-10-CM | POA: Insufficient documentation

## 2020-05-10 DIAGNOSIS — Z419 Encounter for procedure for purposes other than remedying health state, unspecified: Secondary | ICD-10-CM

## 2020-05-10 DIAGNOSIS — I10 Essential (primary) hypertension: Secondary | ICD-10-CM | POA: Insufficient documentation

## 2020-05-10 DIAGNOSIS — Z96651 Presence of right artificial knee joint: Secondary | ICD-10-CM | POA: Diagnosis not present

## 2020-05-10 DIAGNOSIS — Z79890 Hormone replacement therapy (postmenopausal): Secondary | ICD-10-CM | POA: Diagnosis not present

## 2020-05-10 DIAGNOSIS — E785 Hyperlipidemia, unspecified: Secondary | ICD-10-CM | POA: Insufficient documentation

## 2020-05-10 DIAGNOSIS — M48062 Spinal stenosis, lumbar region with neurogenic claudication: Secondary | ICD-10-CM | POA: Insufficient documentation

## 2020-05-10 DIAGNOSIS — Z79899 Other long term (current) drug therapy: Secondary | ICD-10-CM | POA: Diagnosis not present

## 2020-05-10 DIAGNOSIS — M47819 Spondylosis without myelopathy or radiculopathy, site unspecified: Secondary | ICD-10-CM | POA: Insufficient documentation

## 2020-05-10 DIAGNOSIS — Z885 Allergy status to narcotic agent status: Secondary | ICD-10-CM | POA: Diagnosis not present

## 2020-05-10 DIAGNOSIS — Z886 Allergy status to analgesic agent status: Secondary | ICD-10-CM | POA: Insufficient documentation

## 2020-05-10 DIAGNOSIS — I480 Paroxysmal atrial fibrillation: Secondary | ICD-10-CM | POA: Insufficient documentation

## 2020-05-10 HISTORY — PX: LUMBAR LAMINECTOMY/DECOMPRESSION MICRODISCECTOMY: SHX5026

## 2020-05-10 HISTORY — DX: Long term (current) use of anticoagulants: Z79.01

## 2020-05-10 HISTORY — DX: Iron deficiency anemia, unspecified: D50.9

## 2020-05-10 HISTORY — DX: Atherosclerosis of aorta: I70.0

## 2020-05-10 LAB — ABO/RH: ABO/RH(D): A POS

## 2020-05-10 SURGERY — LUMBAR LAMINECTOMY/DECOMPRESSION MICRODISCECTOMY 2 LEVELS
Anesthesia: General

## 2020-05-10 MED ORDER — DEXAMETHASONE SODIUM PHOSPHATE 10 MG/ML IJ SOLN
INTRAMUSCULAR | Status: AC
  Filled 2020-05-10: qty 1

## 2020-05-10 MED ORDER — CEFAZOLIN SODIUM-DEXTROSE 2-4 GM/100ML-% IV SOLN
INTRAVENOUS | Status: AC
  Filled 2020-05-10: qty 100

## 2020-05-10 MED ORDER — SODIUM CHLORIDE 0.9 % IV SOLN
INTRAVENOUS | Status: DC | PRN
  Administered 2020-05-10: 40 mL

## 2020-05-10 MED ORDER — DEXMEDETOMIDINE (PRECEDEX) IN NS 20 MCG/5ML (4 MCG/ML) IV SYRINGE
PREFILLED_SYRINGE | INTRAVENOUS | Status: AC
  Filled 2020-05-10: qty 5

## 2020-05-10 MED ORDER — ONDANSETRON HCL 4 MG/2ML IJ SOLN
INTRAMUSCULAR | Status: AC
  Filled 2020-05-10: qty 2

## 2020-05-10 MED ORDER — ACETAMINOPHEN 10 MG/ML IV SOLN
INTRAVENOUS | Status: DC | PRN
  Administered 2020-05-10: 1000 mg via INTRAVENOUS

## 2020-05-10 MED ORDER — CHLORHEXIDINE GLUCONATE 0.12 % MT SOLN
OROMUCOSAL | Status: AC
  Filled 2020-05-10: qty 15

## 2020-05-10 MED ORDER — CHLORHEXIDINE GLUCONATE 0.12 % MT SOLN
15.0000 mL | Freq: Once | OROMUCOSAL | Status: AC
  Administered 2020-05-10: 15 mL via OROMUCOSAL

## 2020-05-10 MED ORDER — FENTANYL CITRATE (PF) 100 MCG/2ML IJ SOLN
INTRAMUSCULAR | Status: AC
  Filled 2020-05-10: qty 2

## 2020-05-10 MED ORDER — KETAMINE HCL 10 MG/ML IJ SOLN
INTRAMUSCULAR | Status: DC | PRN
  Administered 2020-05-10: 50 mg via INTRAVENOUS

## 2020-05-10 MED ORDER — DEXAMETHASONE SODIUM PHOSPHATE 10 MG/ML IJ SOLN
INTRAMUSCULAR | Status: DC | PRN
  Administered 2020-05-10: 10 mg via INTRAVENOUS

## 2020-05-10 MED ORDER — "VISTASEAL 4 ML SINGLE DOSE KIT "
PACK | CUTANEOUS | Status: DC | PRN
  Administered 2020-05-10: 4 mL via TOPICAL

## 2020-05-10 MED ORDER — ONDANSETRON HCL 4 MG/2ML IJ SOLN
INTRAMUSCULAR | Status: DC | PRN
  Administered 2020-05-10: 4 mg via INTRAVENOUS

## 2020-05-10 MED ORDER — PROPOFOL 10 MG/ML IV BOLUS
INTRAVENOUS | Status: DC | PRN
  Administered 2020-05-10: 110 mg via INTRAVENOUS

## 2020-05-10 MED ORDER — KETAMINE HCL 50 MG/5ML IJ SOSY
PREFILLED_SYRINGE | INTRAMUSCULAR | Status: AC
  Filled 2020-05-10: qty 5

## 2020-05-10 MED ORDER — PHENYLEPHRINE HCL-NACL 10-0.9 MG/250ML-% IV SOLN
INTRAVENOUS | Status: DC | PRN
  Administered 2020-05-10: 10 ug/min via INTRAVENOUS

## 2020-05-10 MED ORDER — SODIUM CHLORIDE 0.9 % IV SOLN
INTRAVENOUS | Status: DC | PRN
  Administered 2020-05-10: .1 ug/kg/min via INTRAVENOUS

## 2020-05-10 MED ORDER — METOPROLOL TARTRATE 5 MG/5ML IV SOLN
INTRAVENOUS | Status: AC
  Filled 2020-05-10: qty 5

## 2020-05-10 MED ORDER — CEFAZOLIN SODIUM-DEXTROSE 2-4 GM/100ML-% IV SOLN
2.0000 g | Freq: Once | INTRAVENOUS | Status: AC
  Administered 2020-05-10: 2 g via INTRAVENOUS

## 2020-05-10 MED ORDER — BUPIVACAINE-EPINEPHRINE (PF) 0.5% -1:200000 IJ SOLN
INTRAMUSCULAR | Status: DC | PRN
  Administered 2020-05-10: 10 mL

## 2020-05-10 MED ORDER — SUCCINYLCHOLINE CHLORIDE 20 MG/ML IJ SOLN
INTRAMUSCULAR | Status: DC | PRN
  Administered 2020-05-10: 100 mg via INTRAVENOUS

## 2020-05-10 MED ORDER — SODIUM CHLORIDE (PF) 0.9 % IJ SOLN
INTRAMUSCULAR | Status: AC
  Filled 2020-05-10: qty 20

## 2020-05-10 MED ORDER — FENTANYL CITRATE (PF) 100 MCG/2ML IJ SOLN
INTRAMUSCULAR | Status: DC | PRN
  Administered 2020-05-10 (×2): 50 ug via INTRAVENOUS

## 2020-05-10 MED ORDER — ACETAMINOPHEN 10 MG/ML IV SOLN
INTRAVENOUS | Status: AC
  Filled 2020-05-10: qty 100

## 2020-05-10 MED ORDER — LACTATED RINGERS IV SOLN: INTRAVENOUS | Status: DC

## 2020-05-10 MED ORDER — THROMBIN 5000 UNITS EX SOLR
CUTANEOUS | Status: DC | PRN
  Administered 2020-05-10: 5000 [IU] via TOPICAL

## 2020-05-10 MED ORDER — REMIFENTANIL HCL 1 MG IV SOLR
INTRAVENOUS | Status: AC
  Filled 2020-05-10: qty 1000

## 2020-05-10 MED ORDER — BUPIVACAINE HCL (PF) 0.5 % IJ SOLN
INTRAMUSCULAR | Status: DC | PRN
  Administered 2020-05-10: 20 mL

## 2020-05-10 MED ORDER — LIDOCAINE HCL (CARDIAC) PF 100 MG/5ML IV SOSY
PREFILLED_SYRINGE | INTRAVENOUS | Status: DC | PRN
  Administered 2020-05-10: 80 mg via INTRAVENOUS
  Administered 2020-05-10: 20 mg via INTRAVENOUS

## 2020-05-10 MED ORDER — ORAL CARE MOUTH RINSE: 15.0000 mL | Freq: Once | OROMUCOSAL | Status: AC

## 2020-05-10 SURGICAL SUPPLY — 52 items
ADH SKN CLS APL DERMABOND .7 (GAUZE/BANDAGES/DRESSINGS) ×1
AGENT HMST MTR 8 SURGIFLO (HEMOSTASIS) ×1
APL PRP STRL LF DISP 70% ISPRP (MISCELLANEOUS) ×2
BUR NEURO DRILL SOFT 3.0X3.8M (BURR) ×2 IMPLANT
CHLORAPREP W/TINT 26 (MISCELLANEOUS) ×4 IMPLANT
CNTNR SPEC 2.5X3XGRAD LEK (MISCELLANEOUS) ×1
CONT SPEC 4OZ STER OR WHT (MISCELLANEOUS) ×1
CONT SPEC 4OZ STRL OR WHT (MISCELLANEOUS) ×1
CONTAINER SPEC 2.5X3XGRAD LEK (MISCELLANEOUS) ×1 IMPLANT
COUNTER NEEDLE 20/40 LG (NEEDLE) ×2 IMPLANT
COVER WAND RF STERILE (DRAPES) ×2 IMPLANT
CUP MEDICINE 2OZ PLAST GRAD ST (MISCELLANEOUS) ×2 IMPLANT
DERMABOND ADVANCED (GAUZE/BANDAGES/DRESSINGS) ×1
DERMABOND ADVANCED .7 DNX12 (GAUZE/BANDAGES/DRESSINGS) ×1 IMPLANT
DRAPE C ARM PK CFD 31 SPINE (DRAPES) ×2 IMPLANT
DRAPE LAPAROTOMY 100X77 ABD (DRAPES) ×2 IMPLANT
DRAPE MICROSCOPE SPINE 48X150 (DRAPES) ×2 IMPLANT
DRAPE SURG 17X11 SM STRL (DRAPES) ×8 IMPLANT
DRSG OPSITE POSTOP 4X6 (GAUZE/BANDAGES/DRESSINGS) ×2 IMPLANT
ELECT CAUTERY BLADE TIP 2.5 (TIP) ×2
ELECT EZSTD 165MM 6.5IN (MISCELLANEOUS) ×4
ELECT REM PT RETURN 9FT ADLT (ELECTROSURGICAL) ×2
ELECTRODE CAUTERY BLDE TIP 2.5 (TIP) ×1 IMPLANT
ELECTRODE EZSTD 165MM 6.5IN (MISCELLANEOUS) ×2 IMPLANT
ELECTRODE REM PT RTRN 9FT ADLT (ELECTROSURGICAL) ×1 IMPLANT
GLOVE SURG SYN 8.5  E (GLOVE) ×3
GLOVE SURG SYN 8.5 E (GLOVE) ×3 IMPLANT
GOWN SRG XL LVL 3 NONREINFORCE (GOWNS) ×1 IMPLANT
GOWN STRL NON-REIN TWL XL LVL3 (GOWNS) ×2
GOWN STRL REUS W/ TWL XL LVL3 (GOWN DISPOSABLE) ×1 IMPLANT
GOWN STRL REUS W/TWL XL LVL3 (GOWN DISPOSABLE) ×2
GRADUATE 1200CC STRL 31836 (MISCELLANEOUS) ×2 IMPLANT
GRAFT DURAGEN MATRIX 1WX1L (Tissue) ×2 IMPLANT
KIT SPINAL PRONEVIEW (KITS) ×2 IMPLANT
KNIFE BAYONET SHORT DISCETOMY (MISCELLANEOUS) IMPLANT
MANIFOLD NEPTUNE II (INSTRUMENTS) ×2 IMPLANT
MARKER SKIN DUAL TIP RULER LAB (MISCELLANEOUS) ×4 IMPLANT
NDL SAFETY ECLIPSE 18X1.5 (NEEDLE) ×1 IMPLANT
NEEDLE HYPO 18GX1.5 SHARP (NEEDLE) ×2
NEEDLE HYPO 22GX1.5 SAFETY (NEEDLE) ×2 IMPLANT
NS IRRIG 1000ML POUR BTL (IV SOLUTION) ×2 IMPLANT
PACK LAMINECTOMY NEURO (CUSTOM PROCEDURE TRAY) ×2 IMPLANT
SPOGE SURGIFLO 8M (HEMOSTASIS) ×1
SPONGE SURGIFLO 8M (HEMOSTASIS) ×1 IMPLANT
SUT DVC VLOC 3-0 CL 6 P-12 (SUTURE) ×2 IMPLANT
SUT VIC AB 0 CT1 27 (SUTURE) ×2
SUT VIC AB 0 CT1 27XCR 8 STRN (SUTURE) ×1 IMPLANT
SUT VIC AB 2-0 CT1 18 (SUTURE) ×2 IMPLANT
SYR 30ML LL (SYRINGE) ×4 IMPLANT
SYR 3ML LL SCALE MARK (SYRINGE) ×2 IMPLANT
TOWEL OR 17X26 4PK STRL BLUE (TOWEL DISPOSABLE) ×6 IMPLANT
TUBING CONNECTING 10 (TUBING) ×2 IMPLANT

## 2020-05-10 NOTE — Anesthesia Procedure Notes (Signed)
Procedure Name: Intubation Date/Time: 05/10/2020 1:45 PM Performed by: Posey Pronto, , CRNA Pre-anesthesia Checklist: Patient identified, Patient being monitored, Timeout performed, Emergency Drugs available and Suction available Patient Re-evaluated:Patient Re-evaluated prior to induction Oxygen Delivery Method: Circle system utilized Preoxygenation: Pre-oxygenation with 100% oxygen Induction Type: IV induction Ventilation: Mask ventilation without difficulty Laryngoscope Size: 3 and McGraph Grade View: Grade II Tube type: Oral Tube size: 7.0 mm Number of attempts: 1 Airway Equipment and Method: Stylet Placement Confirmation: ETT inserted through vocal cords under direct vision,  positive ETCO2 and breath sounds checked- equal and bilateral Secured at: 21 cm Tube secured with: Tape Dental Injury: Teeth and Oropharynx as per pre-operative assessment

## 2020-05-10 NOTE — Op Note (Addendum)
Indications: [name] is a 80 yo female with lumbar stenosis and neurogenic claudication who failed conservative management  Findings: lumbar stenosis  Preoperative Diagnosis: Lumbar Stenosis with neurogenic claudication Postoperative Diagnosis: same   EBL: 20 ml IVF: 500 ml Drains: none Disposition: Extubated and Stable to PACU Complications: none  No foley catheter was placed.   Preoperative Note:   Risks of surgery discussed include: infection, bleeding, stroke, coma, death, paralysis, CSF leak, nerve/spinal cord injury, numbness, tingling, weakness, complex regional pain syndrome, recurrent stenosis and/or disc herniation, vascular injury, development of instability, neck/back pain, need for further surgery, persistent symptoms, development of deformity, and the risks of anesthesia. The patient understood these risks and agreed to proceed.  Operative Note:   1. L3-5 lumbar decompression including central laminectomy and bilateral medial facetectomies including foraminotomies  The patient was then brought from the preoperative center with intravenous access established.  The patient underwent general anesthesia and endotracheal tube intubation, and was then rotated on the Pink rail top where all pressure points were appropriately padded.  The skin was then thoroughly cleansed.  Perioperative antibiotic prophylaxis was administered.  Sterile prep and drapes were then applied and a timeout was then observed.  C-arm was brought into the field under sterile conditions and under lateral visualization the L4-5 interspace was identified and marked.  The incision was marked on the left and injected with local anesthetic. Once this was complete a 4 cm incision was opened with the use of a #10 blade knife.    The metrx tubes were sequentially advanced and confirmed in position at L4-5. An 11mm by 23mm tube was locked in place to the bed side attachment.  The microscope was then sterilely  brought into the field and muscle creep was hemostased with a bipolar and resected with a pituitary rongeur.  A Bovie extender was then used to expose the spinous process and lamina.  Careful attention was placed to not violate the facet capsule. A 3 mm matchstick drill bit was then used to make a hemi-laminotomy trough until the ligamentum flavum was exposed.  This was extended to the base of the spinous process and to the contralateral side to remove all the central bone from each side.  Once this was complete and the underlying ligamentum flavum was visualized, it was dissected with a curette and resected with Kerrison rongeurs.  Extensive ligamentum hypertrophy was noted, requiring a substantial amount of time and care for removal.  The dura was identified and palpated. The kerrison rongeur was then used to remove the medial facet bilaterally until no compression was noted.  A balltip probe was used to confirm decompression of the ipsilateral L5 nerve root.  Additional attention was paid to completion of the contralateral L4-5 foraminotomy until the contralateral traversing nerve root was completely free.  Once this was complete, L4-5 central decompression including medial facetectomy and foraminotomy was confirmed and decompression on both sides was confirmed. A small dural defect was noted.  Duragen and tisseel were used to augment this.   A Depo-Medrol soaked Gelfoam pledget was placed in the defect.  The wound was copiously irrigated. The tube system was then removed under microscopic visualization and hemostasis was obtained with a bipolar.    After performing the decompression at L4-5, the metrx tubes were sequentially advanced and confirmed in position at L3-4. An 63mm by 89mm tube was locked in place to the bed side attachment.  Fluoroscopy was then removed from the field.  The microscope was then  sterilely brought into the field and muscle creep was hemostased with a bipolar and resected with a  pituitary rongeur.  A Bovie extender was then used to expose the spinous process and lamina.  Careful attention was placed to not violate the facet capsule. A 3 mm matchstick drill bit was then used to make a hemi-laminotomy trough until the ligamentum flavum was exposed.  This was extended to the base of the spinous process and to the contralateral side to remove all the central bone from each side.  Once this was complete and the underlying ligamentum flavum was visualized, it was dissected with a curette and resected with Kerrison rongeurs.  Extensive ligamentum hypertrophy was noted, requiring a substantial amount of time and care for removal.  The dura was identified and palpated. The kerrison rongeur was then used to remove the medial facet bilaterally until no compression was noted.  A balltip probe was used to confirm decompression of the ipsilateral L4 nerve root.  Additional attention was paid to completion of the contralateral foraminotomy until the contralateral L4 nerve root was completely free.  Once this was complete, L3-4 central decompression including medial facetectomy and foraminotomy was confirmed and decompression on both sides was confirmed. No CSF leak was noted.  A Depo-Medrol soaked Gelfoam pledget was placed in the defect.  The wound was copiously irrigated. The tube system was then removed under microscopic visualization and hemostasis was obtained with a bipolar.     The fascial layer was reapproximated with the use of a 0 Vicryl suture.  Subcutaneous tissue layer was reapproximated using 2-0 Vicryl suture.  3-0 nylon was placed in running fashion. A dressing was applied  Patient was then rotated back to the preoperative bed awakened from anesthesia and taken to recovery all counts are correct in this case.  I performed the entire procedure with the assistance of Link Snuffer PA as an Pensions consultant.   K. Izora Ribas MD

## 2020-05-10 NOTE — Anesthesia Preprocedure Evaluation (Signed)
Anesthesia Evaluation  Patient identified by MRN, date of birth, ID band Patient awake    Reviewed: Allergy & Precautions, NPO status , Patient's Chart, lab work & pertinent test results  History of Anesthesia Complications Negative for: history of anesthetic complications  Airway Mallampati: II  TM Distance: >3 FB Neck ROM: Full    Dental no notable dental hx.    Pulmonary neg pulmonary ROS, neg sleep apnea, neg COPD,    breath sounds clear to auscultation- rhonchi (-) wheezing      Cardiovascular Exercise Tolerance: Good hypertension, Pt. on medications (-) CAD, (-) Past MI, (-) Cardiac Stents and (-) CABG + dysrhythmias Atrial Fibrillation  Rhythm:Regular Rate:Normal - Systolic murmurs and - Diastolic murmurs    Neuro/Psych neg Seizures Anxiety negative neurological ROS     GI/Hepatic Neg liver ROS, GERD  ,  Endo/Other  negative endocrine ROSneg diabetes  Renal/GU negative Renal ROS     Musculoskeletal negative musculoskeletal ROS (+)   Abdominal (+) + obese,   Peds  Hematology  (+) anemia ,   Anesthesia Other Findings Past Medical History: No date: Anxiety No date: Aortic atherosclerosis (HCC) No date: Current use of long term anticoagulation     Comment:  APIXABAN No date: GERD (gastroesophageal reflux disease) 07/2019: History of 2019 novel coronavirus disease (COVID-19) No date: Hypercholesteremia No date: Hypertension No date: IDA (iron deficiency anemia) No date: Paroxysmal atrial fibrillation (HCC) No date: Skin cancer   Reproductive/Obstetrics                             Anesthesia Physical Anesthesia Plan  ASA: III  Anesthesia Plan: General   Post-op Pain Management:    Induction: Intravenous  PONV Risk Score and Plan: 2 and Ondansetron and Dexamethasone  Airway Management Planned: Oral ETT  Additional Equipment:   Intra-op Plan:   Post-operative Plan:  Extubation in OR  Informed Consent: I have reviewed the patients History and Physical, chart, labs and discussed the procedure including the risks, benefits and alternatives for the proposed anesthesia with the patient or authorized representative who has indicated his/her understanding and acceptance.     Dental advisory given  Plan Discussed with: CRNA and Anesthesiologist  Anesthesia Plan Comments:         Anesthesia Quick Evaluation

## 2020-05-10 NOTE — Discharge Instructions (Addendum)
Your surgeon has performed an operation on your lumbar spine (low back) to relieve pressure on one or more nerves. Many times, patients feel better immediately after surgery and can "overdo it." Even if you feel well, it is important that you follow these activity guidelines. If you do not let your back heal properly from the surgery, you can increase the chance of a disc herniation and/or return of your symptoms. The following are instructions to help in your recovery once you have been discharged from the hospital.  * Do not take anti-inflammatory medications for 1 days after surgery (naproxen [Aleve], ibuprofen [Advil, Motrin], celecoxib [Celebrex], etc.)  Activity    No bending, lifting, or twisting ("BLT"). Avoid lifting objects heavier than 10 pounds (gallon milk jug).  Where possible, avoid household activities that involve lifting, bending, pushing, or pulling such as laundry, vacuuming, grocery shopping, and childcare. Try to arrange for help from friends and family for these activities while your back heals.  Increase physical activity slowly as tolerated.  Taking short walks is encouraged, but avoid strenuous exercise. Do not jog, run, bicycle, lift weights, or participate in any other exercises unless specifically allowed by your doctor. Avoid prolonged sitting, including car rides.  Talk to your doctor before resuming sexual activity.  You should not drive until cleared by your doctor.  Until released by your doctor, you should not return to work or school.  You should rest at home and let your body heal.   You may shower two days after your surgery.  After showering, lightly dab your incision dry. Do not take a tub bath or go swimming for 3 weeks, or until approved by your doctor at your follow-up appointment.  If you smoke, we strongly recommend that you quit.  Smoking has been proven to interfere with normal healing in your back and will dramatically reduce the success rate of  your surgery. Please contact [facility] (800-QUIT-NOW) and use the resources at www.[facility].com for assistance in stopping smoking.  Surgical Incision   If you have a dressing on your incision, you may remove it three days after your surgery. Keep your incision area clean and dry.  If you have staples or stitches on your incision, you should have a follow up scheduled for removal. If you do not have staples or stitches, you will have steri-strips (small pieces of surgical tape) or Dermabond glue. The steri-strips/glue should begin to peel away within about a week (it is fine if the steri-strips fall off before then). If the strips are still in place one week after your surgery, you may gently remove them.  Diet            You may return to your usual diet. Be sure to stay hydrated.  When to Contact [name]  Although your surgery and recovery will likely be uneventful, you may have some residual numbness, aches, and pains in your back and/or legs. This is normal and should improve in the next few weeks.  However, should you experience any of the following, contact [name] immediately: . New numbness or weakness . Pain that is progressively getting worse, and is not relieved by your pain medications or rest . Bleeding, redness, swelling, pain, or drainage from surgical incision . Chills or flu-like symptoms . Fever greater than 101.0 F (38.3 C) . Problems with bowel or bladder functions . Difficulty breathing or shortness of breath . Warmth, tenderness, or swelling in your calf  Contact Information . During office hours (Monday-Friday  9 am to 5 pm), please call your physician at [phone] . After hours and weekends, please call [phone] and speak with the answering service, who will contact the doctor on call.  If that fails, call the [place] Operator at [phone] and ask for the Neurosurgery Resident On Call  . For a life-threatening emergency, call [place]   1) The drugs that you were given will stay in your system until tomorrow so for the next 24 hours you should not:  A) Drive an automobile B) Make any legal decisions C) Drink any alcoholic beverage   2) You may resume regular meals tomorrow.  Today it is better to start with liquids and gradually work up to solid foods.  You may eat anything you prefer, but it is better to start with liquids, then soup and crackers, and gradually work up to solid foods.   3) Please notify your doctor immediately if you have any unusual bleeding, trouble breathing, redness and pain at the surgery site, drainage, fever, or pain not relieved by medication.                             DO NO REMOVE EXPAREL BAND (Teal bracelet)  BEFORE 05/15/2020 (96 hours)  EXPAREL (bupivacaine liposome injectable suspension)  Your surgeon or anesthesiologist gave you EXPAREL(bupivacaine) to help control your pain after surgery.  EXPAREL is a local anesthetic that provides pain relief by numbing the tissue around the surgical site. EXPAREL is designed to release pain medication over time and can control pain for up to 72 hours. Depending on how you respond to EXPAREL, you may require less pain medication during your recovery. Possible side effects: Temporary loss of sensation or ability to move in the area where bupivacaine was injected. Nausea, vomiting, constipation Rarely, numbness and tingling in your mouth or lips, lightheadedness, or anxiety may occur. Call your doctor right away if you think you may be experiencing any of these sensations, or if you have other questions regarding possible side effects.  Follow all other discharge instructions given to you by your surgeon or nurse. Eat a healthy diet and drink plenty of water or other fluids.  4) If you return to the hospital for any reason within 96 hours following the administration of EXPAREL, it is important for health care providers to know that  you have received this anesthetic. A teal colored band has been placed on your arm with the date, time and amount of EXPAREL you have received in order to alert and inform your health care providers. Please leave this armband in place for the full 96 hours following administration, and then you may remove the band.        Please contact your physician with any problems or Same Day Surgery at [phone], Monday through Friday 6 am to 4 pm, or [place] at [facility] number at [phone].

## 2020-05-10 NOTE — Progress Notes (Signed)
Patient is able to bend knees, dorsi and plantar flexion and extension in tact, strong, grips equal bilateral moderate. Facial expressions symmetrical. Patient is drowsy, denies pain at this time.

## 2020-05-10 NOTE — Transfer of Care (Signed)
Immediate Anesthesia Transfer of Care Note  Patient: [name]  Procedure(s) Performed: L3-5 DECOMPRESSION (N/A )  Patient Location: PACU  Anesthesia Type:General  Level of Consciousness: drowsy  Airway & Oxygen Therapy: Patient Spontanous Breathing and Patient connected to face mask oxygen  Post-op Assessment: Report given to RN and Post -op Vital signs reviewed and stable  Post vital signs: Reviewed and stable  Last Vitals:  Vitals Value Taken Time  BP 169/76 05/10/20 1615  Temp    Pulse 86 05/10/20 1622  Resp 15 05/10/20 1622  SpO2 92 % 05/10/20 1622  Vitals shown include unvalidated device data.  Last Pain:  Vitals:   05/10/20 1214  TempSrc: Temporal  PainSc: 3       Patients Stated Pain Goal: 2 (82/95/62 1308)  Complications: No complications documented.

## 2020-05-10 NOTE — Discharge Summary (Signed)
Physician Discharge Summary  Patient ID: MUNIRAH DOERNER MRN: [id number] DOB/AGE: [birth year] 81 y.o.  Admit date: 05/10/2020 Discharge date: 05/10/2020  Admission Diagnoses: neurogenic claudication  Discharge Diagnoses:  Active Problems:   * No active hospital problems. *   Discharged Condition: good  Hospital Course: Ms. Brugger tolerated surgery well, and met criteria for discharge from recovery area.  Consults: None  Significant Diagnostic Studies: radiology: X-Ray: Localization xray  Treatments: surgery: L3-5 decompression   Discharge Exam: Blood pressure (!) 157/80, pulse 74, temperature 99.1 F (37.3 C), temperature source Temporal, resp. rate 16, height _0  (1.575 m), weight 81.2 kg, SpO2 99 %. General appearance: alert and cooperative  MAEW CNI  Disposition: Discharge disposition: 01-Home or Self Care        Allergies as of 05/10/2020      Reactions   Aspirin    Causes bleeding in stomach   Nsaids Other (See Comments)   Bleeding in stomach   Morphine And Related Other (See Comments)   Disoriented hallucinations   Oxycodone Other (See Comments)   Disoriented Hallucinations      Medication List    TAKE these medications   amLODipine 5 MG tablet Commonly known as: NORVASC Take 5 mg by mouth in the morning.   baclofen 10 MG tablet Commonly known as: LIORESAL Take 5 mg by mouth 2 (two) times daily.   cholecalciferol 1000 units tablet Commonly known as: VITAMIN D Take 2,000 Units by mouth in the morning.   dronedarone 400 MG tablet Commonly known as: MULTAQ Take 1 tablet by mouth 2 (two) times daily with a meal.   Eliquis 5 MG Tabs tablet Generic drug: apixaban Take 1 tablet by mouth 2 times daily. Call clinic - overdue for appt with Dr Rayann Heman. What changed:   how much to take  how to take this  when to take this  additional instructions Notes to patient: Start back on May 4.   escitalopram 10 MG tablet Commonly known as:  LEXAPRO Take 10 mg by mouth at bedtime.   estradiol 0.5 MG tablet Commonly known as: ESTRACE Take 0.5 mg by mouth every morning.   HYDROcodone-acetaminophen 5-325 MG tablet Commonly known as: NORCO/VICODIN Take 0.5-1 tablets by mouth See admin instructions. Take 1 tablet by mouth at bedtime and a half tablet over night if needed.   meclizine 25 MG tablet Commonly known as: ANTIVERT Take 25 mg by mouth at bedtime.   multivitamin tablet Take 1 tablet by mouth every morning.   omeprazole 20 MG capsule Commonly known as: PRILOSEC Take 20 mg by mouth 2 (two) times daily.   pravastatin 40 MG tablet Commonly known as: PRAVACHOL Take 40 mg by mouth at bedtime.       Follow-up Information    Meade Maw, MD In 2 weeks.   Specialty: Neurosurgery Why: For suture removal Contact information: [place] [address] [phone]               Signed: Meade Maw 05/10/2020, 4:11 PM

## 2020-05-10 NOTE — Anesthesia Postprocedure Evaluation (Signed)
Anesthesia Post Note  Patient: [name]  Procedure(s) Performed: L3-5 DECOMPRESSION (N/A )  Patient location during evaluation: PACU Anesthesia Type: General Level of consciousness: awake and alert Pain management: pain level controlled Vital Signs Assessment: post-procedure vital signs reviewed and stable Respiratory status: spontaneous breathing, nonlabored ventilation, respiratory function stable and patient connected to nasal cannula oxygen Cardiovascular status: blood pressure returned to baseline and stable Postop Assessment: no apparent nausea or vomiting Anesthetic complications: no   No complications documented.   Last Vitals:  Vitals:   05/10/20 1700 05/10/20 1715  BP:    Pulse:    Resp:    Temp:    SpO2: 92% 99%    Last Pain:  Vitals:   05/10/20 1715  TempSrc:   PainSc: 0-No pain                 Martha Clan

## 2020-05-10 NOTE — H&P (Signed)
History of Present Illness: 05/10/2020 [name] continues to have left leg pain.  She presents for surgery.  04/06/2020  [name] returns to see me. She has continued left sided pain down the leg. It is painful into her buttock and her anterior thigh. She had a hip injections which did not help. She is also tried physical therapy for several weeks without improvement.  At this point she is having difficulty going about her day-to-day life due to pain.  02/03/2020 [name] is here today with a chief complaint of low back pain that radiates into her bilateral hips, bilateral groin, and bilateral anterior thighs. The pain alternates between both thighs. Her legs are more bothersome than her back. The injections are no longer helping.  She began having issues in 2015 and has had an extensive course of injections over the past 6 years. She did physical therapy last several years ago. She reports that her pain is now impacting her ability to go about her day-to-day life. She also reports some pain into her left groin.  Standing, walking, and rolling over in bed make her pain worse. She is having trouble walking more than 5 minutes. Her pain is worst at nighttime. She is having to take some medications so that she is able to sleep. She denies weakness or bowel or bladder dysfunction.  Conservative measures:  Physical therapy: has participated in the past, most recently in 2019. She recently started water aerobics  Multimodal medical therapy including regular antiinflammatories: tylenol, hydrocodone, tramadol, prednisone Injections: has had epidural steroid injections  11/22/2019: Right L3-4 transforaminal ESI 10/27/2019: Right L3-4 transforaminal ESI 09/24/2019: Left hip joint injection 07/29/2019: Left L5, right L4 and right L5 trigger point injections 05/13/2019: Bilateral L5 and bilateral S1 trigger point injections  04/12/2019: Bilateral L4-5 facet joint injections 12/07/2018:  Bilateral L5 and bilateral S1 trigger point injections 09/08/2018: Bilateral L4-5 facet joint injections  05/21/2018: Bilateral L4-5 facet joint injections 04/20/2018: Left superior buttock trigger point injection 01/22/2018: Bilateral L4-5 facet joint injections 09/11/2017: Bilateral L4-5 facet joint injections  06/24/2017: Bilateral L4-5 facet joint injection 04/18/2017: Bilateral S1 transforaminal ESI 03/25/2017: Bilateral S1 transforaminal ESI 02/26/2017: Left hip joint injection 12/20/2016: Left L3-4 and L4-5 transforaminal ESI 11/05/2016: Left L3-4 and L4-5 transforaminal ESI 08/21/2016: Left hip injection 06/12/2016: Bilateral L3, L4, and L5 radiofrequency neurotomy 05/16/2016: Medial branch nerve blocks to the bilateral L4-5 and L5-S1 facet joints 04/11/2016: Medial branch nerve blocks to the bilateral L4-5 and L5-S1 facet joints 02/28/2016: Left trochanteric bursa injection 04/19/2015: Rt L3-4 transforaminal ESI 03/22/2015: Rt L3-4 transforaminal ESI 12/21/2013: Rt L3-4 transforaminal ESI 11/30/2013: Right sacroiliac joint injection 11/10/2013: Right hip joint injection   Past Surgery: no previous surgeries  [name] has no symptoms of cervical myelopathy.  The symptoms are causing a significant impact on the patient's life.   Review of Systems:  A 10 point review of systems is negative, except for the pertinent positives and negatives detailed in the HPI.  Past Medical History: Past Medical History:  Diagnosis Date  . Accelerated hypertension  . Anemia, unspecified  . Atrial fibrillation (CMS-HCC) onset 11/2012  . Carpal tunnel syndrome  . Chickenpox  . COVID-19 07/2019  . Degenerative arthritis  . Diverticulosis  . Enthesopathy of hip region  . Essential hypertension, benign  . Fibrocystic breast disease  . Internal hemorrhoids  . 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  . Irritable bowel syndrome  . Other and unspecified  hyperlipidemia  .  Postmenopausal  on HRT  . Situational anxiety  . Situational stress  . Vaginal yeast infection  . Valvular heart disease   Past Surgical History: Past Surgical History:  Procedure Laterality Date  . nasal surgery by Dr. Caryn Section several years ago for a deviated septum  . CATARACT EXTRACTION Right 07/13/2014  Dr. Wallace Going  . CATARACT EXTRACTION Left 10/05/2014  Dr. Wallace Going  . COLONOSCOPY 10/16/1993, 05/27/2005, 09/25/2010  . EGD 08/06/2005  . HYSTERECTOMY age 71  partial  . JOINT REPLACEMENT Right 01/17/2011  Right total knee replacement.  . Multiple breast biopsies    Allergies  Allergen Reactions  . Aspirin     Causes bleeding in stomach  . Nsaids Other (See Comments)    Bleeding in stomach  . Morphine And Related Other (See Comments)    Disoriented hallucinations  . Oxycodone Other (See Comments)    Disoriented Hallucinations      Current Meds  Medication Sig  . amLODipine (NORVASC) 5 MG tablet Take 5 mg by mouth in the morning.  . baclofen (LIORESAL) 10 MG tablet Take 5 mg by mouth 2 (two) times daily.  . cholecalciferol (VITAMIN D) 1000 units tablet Take 2,000 Units by mouth in the morning.  . dronedarone (MULTAQ) 400 MG tablet Take 1 tablet by mouth 2 (two) times daily with a meal.  . ELIQUIS 5 MG TABS tablet Take 1 tablet by mouth 2 times daily. Call clinic - overdue for appt with Dr Rayann Heman. (Patient taking differently: Take 5 mg by mouth 2 (two) times daily.)  . escitalopram (LEXAPRO) 10 MG tablet Take 10 mg by mouth at bedtime.  Marland Kitchen estradiol (ESTRACE) 0.5 MG tablet Take 0.5 mg by mouth every morning.  Marland Kitchen HYDROcodone-acetaminophen (NORCO/VICODIN) 5-325 MG tablet Take 0.5-1 tablets by mouth See admin instructions. Take 1 tablet by mouth at bedtime and a half tablet over night if needed.  . meclizine (ANTIVERT) 25 MG tablet Take 25 mg by mouth at bedtime.  . Multiple Vitamin (MULTIVITAMIN) tablet Take 1 tablet by mouth every morning.  Marland Kitchen  omeprazole (PRILOSEC) 20 MG capsule Take 20 mg by mouth 2 (two) times daily.  . pravastatin (PRAVACHOL) 40 MG tablet Take 40 mg by mouth at bedtime.    No facility-administered encounter medications on file as of 04/06/2020.   Social History: Social History   Tobacco Use  . Smoking status: Never Smoker  . Smokeless tobacco: Never Used  Vaping Use  . Vaping Use: Never used  Substance Use Topics  . Alcohol use: No  Alcohol/week: 0.0 standard drinks  . Drug use: Never   Family Medical History: Family History  Problem Relation Age of Onset  . Myocardial Infarction (Heart attack) Father  . Coronary Artery Disease (Blocked arteries around heart) Father  . Dementia Mother   Physical Examination:  Vitals:   05/10/20 1214  BP: (!) 157/80  Pulse: 74  Resp: 16  Temp: 99.1 F (37.3 C)  SpO2: 99%   Heart sounds normal no MRG. Chest Clear to Auscultation Bilaterally.  General: Patient is well developed, well nourished, calm, collected, and in no apparent distress. Attention to examination is appropriate.  Psychiatric: Patient is non-anxious.  Head: Pupils equal, round, and reactive to light.  ENT: Oral mucosa appears well hydrated.  Neck: Supple. Full range of motion.  Respiratory: Patient is breathing without any difficulty.  Extremities: No edema.  Vascular: Palpable dorsal pedal pulses.  Skin: On exposed skin, there are no abnormal skin lesions.  NEUROLOGICAL:   Awake,  alert, oriented to person, place, and time. Speech is clear and fluent. Fund of knowledge is appropriate.   Cranial Nerves: Pupils equal round and reactive to light. Facial tone is symmetric. Facial sensation is symmetric. Shoulder shrug is symmetric. Tongue protrusion is midline. There is no pronator drift.  ROM of spine: full.  Strength: Side Biceps Triceps Deltoid Interossei Grip Wrist Ext. Wrist Flex.  R 5 5 5 5 5 5 5   L 5 5 5 5 5 5 5    Side Iliopsoas Quads Hamstring PF DF EHL  R 5 5 5 5 5  5   L 5 5 5 5 5 5    Reflexes are 1+ and symmetric at the biceps, triceps, brachioradialis, patella and achilles. Hoffman's is absent. Clonus is not present. Toes are down-going.  Bilateral upper and lower extremity sensation is intact to light touch.  Gait is abnormal. She uses a Rollator.No evidence of dysmetria noted.  FABER testing is negative bilaterally  Medical Decision Making  Imaging: MRI L spine 01/09/2020  IMPRESSION:  Multilevel spondylosis, progressed since prior exam.   Moderate spinal canal and moderate to severe bilateral neural  foraminalnarrowing at the L2-3 and L3-4 levels.   Moderate spinal canal and moderate right neural foraminal narrowing  at the L4-5 level.   Electronically Signed  By: Primitivo Gauze M.D.  On: 01/10/2020 08:35  I have personally reviewed the images and agree with the above interpretation.  Assessment and Plan: [name] is a pleasant 80 y.o. female with anterolisthesis and neurogenic claudication. I think her worst issues are at L3-4 and L4-5. 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 I would recommend L3-4 and L4-5 decompression.    Meade Maw MD, [facility] Department of Neurosurgery

## 2020-05-11 ENCOUNTER — Encounter: Payer: Self-pay | Admitting: Neurosurgery

## 2020-06-09 ENCOUNTER — Other Ambulatory Visit: Payer: Self-pay | Admitting: Orthopedic Surgery

## 2020-06-14 ENCOUNTER — Other Ambulatory Visit
Admission: RE | Admit: 2020-06-14 | Discharge: 2020-06-14 | Disposition: A | Discharge status: Home / Self Care | Payer: Medicare Other | Source: Ambulatory Visit | Attending: Neurosurgery | Admitting: Neurosurgery

## 2020-06-14 ENCOUNTER — Other Ambulatory Visit: Payer: Self-pay

## 2020-06-14 DIAGNOSIS — Z20822 Contact with and (suspected) exposure to covid-19: Secondary | ICD-10-CM | POA: Insufficient documentation

## 2020-06-14 DIAGNOSIS — Z01812 Encounter for preprocedural laboratory examination: Secondary | ICD-10-CM | POA: Insufficient documentation

## 2020-06-14 LAB — URINALYSIS, ROUTINE W REFLEX MICROSCOPIC
Bilirubin Urine: NEGATIVE
Glucose, UA: NEGATIVE mg/dL
Hgb urine dipstick: NEGATIVE
Ketones, ur: NEGATIVE mg/dL
Nitrite: NEGATIVE
Protein, ur: 30 mg/dL — AB
Specific Gravity, Urine: 1.024 (ref 1.005–1.030)
pH: 5 (ref 5.0–8.0)

## 2020-06-14 LAB — CBC WITH DIFFERENTIAL/PLATELET
Abs Immature Granulocytes: 0.03 10*3/uL (ref 0.00–0.07)
Basophils Absolute: 0.1 10*3/uL (ref 0.0–0.1)
Basophils Relative: 1 %
Eosinophils Absolute: 0.4 10*3/uL (ref 0.0–0.5)
Eosinophils Relative: 4 %
HCT: 35.3 % — ABNORMAL LOW (ref 36.0–46.0)
Hemoglobin: 11.3 g/dL — ABNORMAL LOW (ref 12.0–15.0)
Immature Granulocytes: 0 %
Lymphocytes Relative: 28 %
Lymphs Abs: 2.5 10*3/uL (ref 0.7–4.0)
MCH: 31 pg (ref 26.0–34.0)
MCHC: 32 g/dL (ref 30.0–36.0)
MCV: 96.7 fL (ref 80.0–100.0)
Monocytes Absolute: 0.8 10*3/uL (ref 0.1–1.0)
Monocytes Relative: 9 %
Neutro Abs: 5 10*3/uL (ref 1.7–7.7)
Neutrophils Relative %: 58 %
Platelets: 350 10*3/uL (ref 150–400)
RBC: 3.65 MIL/uL — ABNORMAL LOW (ref 3.87–5.11)
RDW: 13.2 % (ref 11.5–15.5)
WBC: 8.7 10*3/uL (ref 4.0–10.5)
nRBC: 0 % (ref 0.0–0.2)

## 2020-06-14 LAB — COMPREHENSIVE METABOLIC PANEL
ALT: 12 U/L (ref 0–44)
AST: 13 U/L — ABNORMAL LOW (ref 15–41)
Albumin: 3.6 g/dL (ref 3.5–5.0)
Alkaline Phosphatase: 52 U/L (ref 38–126)
Anion gap: 10 (ref 5–15)
BUN: 19 mg/dL (ref 8–23)
CO2: 25 mmol/L (ref 22–32)
Calcium: 8.8 mg/dL — ABNORMAL LOW (ref 8.9–10.3)
Chloride: 105 mmol/L (ref 98–111)
Creatinine, Ser: 1.25 mg/dL — ABNORMAL HIGH (ref 0.44–1.00)
GFR, Estimated: 43 mL/min — ABNORMAL LOW (ref >60)
Glucose, Bld: 84 mg/dL (ref 70–99)
Potassium: 3.9 mmol/L (ref 3.5–5.1)
Sodium: 140 mmol/L (ref 135–145)
Total Bilirubin: 0.5 mg/dL (ref 0.3–1.2)
Total Protein: 7.2 g/dL (ref 6.5–8.1)

## 2020-06-14 LAB — SURGICAL PCR SCREEN
MRSA, PCR: NEGATIVE
Staphylococcus aureus: NEGATIVE

## 2020-06-14 LAB — TYPE AND SCREEN
ABO/RH(D): A POS
Antibody Screen: NEGATIVE

## 2020-06-14 LAB — SARS CORONAVIRUS 2 (TAT 6-24 HRS): SARS Coronavirus 2: NEGATIVE

## 2020-06-14 NOTE — Progress Notes (Signed)
  [facility] Perioperative Services: Pre-Admission/Anesthesia Testing  Abnormal Lab Notification   Date: 06/14/20  Name: [name] MRN:   [id number]  Re: Abnormal labs noted during PAT appointment   Provider(s) Notified: Hessie Knows, MD Notification mode: Routed and/or faxed via [place] LAB VALUE(S): Lab Results  Component Value Date   COLORURINE AMBER (A) 06/14/2020   APPEARANCEUR CLOUDY (A) 06/14/2020   LABSPEC 1.024 06/14/2020   PHURINE 5.0 06/14/2020   GLUCOSEU NEGATIVE 06/14/2020   HGBUR NEGATIVE 06/14/2020   BILIRUBINUR NEGATIVE 06/14/2020   KETONESUR NEGATIVE 06/14/2020   PROTEINUR 30 (A) 06/14/2020   NITRITE NEGATIVE 06/14/2020   LEUKOCYTESUR MODERATE (A) 06/14/2020   EPIU 11-20 06/14/2020   WBCU 11-20 06/14/2020   RBCU 11-20 06/14/2020   BACTERIA MANY (A) 06/14/2020    Notes:  Patient scheduled for TOTAL HIP ARTHROPLASTY ANTERIOR APPROACH (Left Hip) on 06/16/2020.  UA performed in PAT shows possibility of infection.  . No leukocytosis noted on CBC; WBC 8700 . Renal function normal. Estimated Creatinine Clearance: 34.6 mL/min (A) (by C-G formula based on SCr of 1.25 mg/dL (H)). . Urine C&S added to assess for pathogenically significant growth.  Suspect sample contamination as there are 11-20 squamous epithelial cells/LPF present in the provided sample. Deferring treatment pending culture. Will forward UA, and subsequent C&S results, to attending surgeon for review and Tx as deemed appropriate.   This is a Community education officer; no formal response is required.  Honor Loh, MSN, APRN, FNP-C, CEN [facility]  Peri-operative Services Nurse Practitioner Phone: [phone] Fax: [phone] 06/14/20 1:47 PM

## 2020-06-14 NOTE — Progress Notes (Signed)
Perioperative Services Pre-Admission/Anesthesia Testing    Date: 06/15/20  Name: [name] MRN:   [id number] Re: Presurgical clinical review   Case: [id number] Date/Time: 06/16/20 1245   Procedure: TOTAL HIP ARTHROPLASTY ANTERIOR APPROACH (Left Hip)   Anesthesia type: Choice   Pre-op diagnosis: Primary osteoarthritis of left hip M16.12   Location: [place] [facility] / [facility]   Surgeons: Hessie Knows, MD    Patient scheduled for the above procedure on 06/16/2020 with Dr. Hessie Knows.  Of note, patient recently reviewed by PAT APP prior to neurosurgical procedure that was performed on 05/10/2020; see my note dated 05/08/2020.  Interval events and medical history reviewed.  Patient underwent a L3-L5 decompression on 05/10/2020.  Patient's procedure noted to be uncomplicated and she was discharged home on the same day. There have been no other significant additions to her health history or changes to her medications since patient was last reviewed.  EKG up to date; showed NSR at a rate of 72 bpm on 03/16/2020. Updated clearance for procedure was obtained from patient's cardiologist. Per cardiology, "this patient is optimized for surgery and may proceed with the planned procedural course with an ACCEPTABLE risk stratification".  Patient is on daily anticoagulation therapy using apixaban therapy.  Recommendations from her cardiologist were discussed with patient during her PAT appointment.  Cardiology requesting the patient hold her apixaban for 3 days prior to her procedure with plans to restart as soon as postoperatively respectively minimized by primary attending surgeon. Of note, patient took her apixaban dose today (06/14/2020). Surgeon's office was contacted to discuss concerns related to the suboptimal wash out period. As it stands, neuraxial anesthetic course cannot be considered as part of her anesthesia plan of care for this patient due to associated bleeding risk. Per  ASRA guidelines, there must be at least 72 hours between last dose of anticoagulant and neuraxial anesthesia.  Per Dr. Rudene Christians, bleeding risk is acceptable. He is aware that patient will only be able to receive general anesthesia and is willing to proceed as planned.   Interval Work Up:   Hospital Outpatient Visit on 06/14/2020  Component Date Value Ref Range Status  . SARS Coronavirus 2 06/14/2020 NEGATIVE  NEGATIVE Final   Comment: (NOTE) SARS-CoV-2 target nucleic acids are NOT DETECTED.  The SARS-CoV-2 RNA is generally detectable in upper and lower respiratory specimens during the acute phase of infection. Negative results do not preclude SARS-CoV-2 infection, do not rule out co-infections with other pathogens, and should not be used as the sole basis for treatment or other patient management decisions. Negative results must be combined with clinical observations, patient history, and epidemiological information. The expected result is Negative.  Fact Sheet for Patients: SugarRoll.be  Fact Sheet for Healthcare Providers: https://www.woods-mathews.com/  This test is not yet approved or cleared by the [place] FDA and  has been authorized for detection and/or diagnosis of SARS-CoV-2 by FDA under an Emergency Use Authorization (EUA). This EUA will remain  in effect (meaning this test can be used) for the duration of the COVID-19 declaration under Se                          ction 564(b)(1) of the Act, 21 U.S.C. section 360bbb-3(b)(1), unless the authorization is terminated or revoked sooner.  Performed at [facility], [place] [address], [place], [place] [address]   . MRSA, PCR 06/14/2020 NEGATIVE  NEGATIVE Final  . Staphylococcus  aureus 06/14/2020 NEGATIVE  NEGATIVE Final   Comment: (NOTE) The Xpert SA Assay (FDA approved for NASAL specimens in patients 72 years of age and older), is one component of a comprehensive surveillance  program. It is not intended to diagnose infection nor to guide or monitor treatment. Performed at [facility], [address] [place], [place] [address]   . WBC 06/14/2020 8.7  4.0 - 10.5 K/uL Final  . RBC 06/14/2020 3.65* 3.87 - 5.11 MIL/uL Final  . Hemoglobin 06/14/2020 11.3* 12.0 - 15.0 g/dL Final  . HCT 06/14/2020 35.3* 36.0 - 46.0 % Final  . MCV 06/14/2020 96.7  80.0 - 100.0 fL Final  . MCH 06/14/2020 31.0  26.0 - 34.0 pg Final  . MCHC 06/14/2020 32.0  30.0 - 36.0 g/dL Final  . RDW 06/14/2020 13.2  11.5 - 15.5 % Final  . Platelets 06/14/2020 350  150 - 400 K/uL Final  . nRBC 06/14/2020 0.0  0.0 - 0.2 % Final  . Neutrophils Relative % 06/14/2020 58  % Final  . Neutro Abs 06/14/2020 5.0  1.7 - 7.7 K/uL Final  . Lymphocytes Relative 06/14/2020 28  % Final  . Lymphs Abs 06/14/2020 2.5  0.7 - 4.0 K/uL Final  . Monocytes Relative 06/14/2020 9  % Final  . Monocytes Absolute 06/14/2020 0.8  0.1 - 1.0 K/uL Final  . Eosinophils Relative 06/14/2020 4  % Final  . Eosinophils Absolute 06/14/2020 0.4  0.0 - 0.5 K/uL Final  . Basophils Relative 06/14/2020 1  % Final  . Basophils Absolute 06/14/2020 0.1  0.0 - 0.1 K/uL Final  . Immature Granulocytes 06/14/2020 0  % Final  . Abs Immature Granulocytes 06/14/2020 0.03  0.00 - 0.07 K/uL Final   Performed at [facility], [address], [place], [place] [address]  . Sodium 06/14/2020 140  135 - 145 mmol/L Final  . Potassium 06/14/2020 3.9  3.5 - 5.1 mmol/L Final  . Chloride 06/14/2020 105  98 - 111 mmol/L Final  . CO2 06/14/2020 25  22 - 32 mmol/L Final  . Glucose, Bld 06/14/2020 84  70 - 99 mg/dL Final   Glucose reference range applies only to samples taken after fasting for at least 8 hours.  . BUN 06/14/2020 19  8 - 23 mg/dL Final  . Creatinine, Ser 06/14/2020 1.25* 0.44 - 1.00 mg/dL Final  . Calcium 06/14/2020 8.8* 8.9 - 10.3 mg/dL Final  . Total Protein 06/14/2020 7.2  6.5 - 8.1 g/dL Final  . Albumin 06/14/2020 3.6   3.5 - 5.0 g/dL Final  . AST 06/14/2020 13* 15 - 41 U/L Final  . ALT 06/14/2020 12  0 - 44 U/L Final  . Alkaline Phosphatase 06/14/2020 52  38 - 126 U/L Final  . Total Bilirubin 06/14/2020 0.5  0.3 - 1.2 mg/dL Final  . GFR, Estimated 06/14/2020 43* >60 mL/min Final   Comment: (NOTE) Calculated using the CKD-EPI Creatinine Equation (2021)   . Anion gap 06/14/2020 10  5 - 15 Final   Performed at [facility], [address] [place],  [address]  . Color, Urine 06/14/2020 AMBER* YELLOW Final   BIOCHEMICALS MAY BE AFFECTED BY COLOR  . APPearance 06/14/2020 CLOUDY* CLEAR Final  . Specific Gravity, Urine 06/14/2020 1.024  1.005 - 1.030 Final  . pH 06/14/2020 5.0  5.0 - 8.0 Final  . Glucose, UA 06/14/2020 NEGATIVE  NEGATIVE mg/dL Final  . Hgb urine dipstick 06/14/2020 NEGATIVE  NEGATIVE Final  . Bilirubin Urine  06/14/2020 NEGATIVE  NEGATIVE Final  . Ketones, ur 06/14/2020 NEGATIVE  NEGATIVE mg/dL Final  . Protein, ur 06/14/2020 30* NEGATIVE mg/dL Final  . Nitrite 06/14/2020 NEGATIVE  NEGATIVE Final  . Leukocytes,Ua 06/14/2020 MODERATE* NEGATIVE Final  . RBC / HPF 06/14/2020 11-20  0 - 5 RBC/hpf Final  . WBC, UA 06/14/2020 11-20  0 - 5 WBC/hpf Final  . Bacteria, UA 06/14/2020 MANY* NONE SEEN Final  . Squamous Epithelial / LPF 06/14/2020 11-20  0 - 5 Final  . Mucus 06/14/2020 PRESENT   Final  . Hyaline Casts, UA 06/14/2020 PRESENT   Final   Performed at [facility], [address] [place], [place] [address]  . ABO/RH(D) 06/14/2020 A POS   Final  . Antibody Screen 06/14/2020 NEG   Final  . Sample Expiration 06/14/2020 06/28/2020,2359   Final  . Extend sample reason 06/14/2020    Final                   Value:NO TRANSFUSIONS OR PREGNANCY IN THE PAST 3 MONTHS Performed at [facility], [address] [place], [place] [address]    Impression and Plan:  ALINE WESCHE has been referred for pre-anesthesia review and clearance prior to her undergoing the  planned anesthetic and procedural courses. Available labs, pertinent testing, and imaging results were personally reviewed by me. This patient has been appropriately cleared by cardiology with an overall ACCEPTABLE risk of significant perioperative cardiovascular complications.  Based on clinical review performed today (06/15/20), barring any significant acute changes in the patient's overall condition, it is anticipated that she will be able to proceed with the planned surgical intervention. Any acute changes in clinical condition may necessitate her procedure being postponed and/or cancelled. Patient will meet with anesthesia team (MD and/or CRNA) on this day of her procedure for preoperative evaluation/assessment.   Pre-surgical instructions were reviewed with the patient during her PAT appointment and questions were fielded by PAT clinical staff. Patient was advised that if any questions or concerns arise prior to her procedure then she should return a call to PAT and/or her surgeon's office to discuss.  Honor Loh, MSN, APRN, FNP-C, CEN [facility]  Peri-operative Services Nurse Practitioner Phone: [phone] 06/15/20 8:41 AM  NOTE: This note has been prepared using Dragon dictation software. Despite my best ability to proofread, there is always the potential that unintentional transcriptional errors may still occur from this process.

## 2020-06-14 NOTE — Patient Instructions (Signed)
Your procedure is scheduled on: 06/16/2020 Report to the Registration Desk on the 1st floor of the [facility]. To find out your arrival time, please call [phone] between 1PM - 3PM on: 06/15/2020  REMEMBER: Instructions that are not followed completely may result in serious medical risk, up to and including death; or upon the discretion of your surgeon and anesthesiologist your surgery may need to be rescheduled.  Do not eat food after midnight the night before surgery.  No gum chewing, lozengers or hard candies.  You may however, drink CLEAR liquids up to 2 hours before you are scheduled to arrive for your surgery. Do not drink anything within 2 hours of your scheduled arrival time.  Clear liquids include: - water  - apple juice without pulp - gatorade  - black coffee or tea (Do NOT add milk or creamers to the coffee or tea) Do NOT drink anything that is not on this list.   In addition, your doctor has ordered for you to drink the provided  Ensure Pre-Surgery Clear Carbohydrate Drink  Drinking this carbohydrate drink up to two hours before surgery helps to reduce insulin resistance and improve patient outcomes. Please complete drinking 2 hours prior to scheduled arrival time.  TAKE THESE MEDICATIONS THE MORNING OF SURGERY WITH A SIP OF WATER: Prilosec, Amlodipine, Multac (Dronedarone), Gabapentin  Follow recommendations from Cardiologist, Pulmonologist or PCP regarding stopping Aspirin, Coumadin, Plavix, Eliquis, Pradaxa, or Pletal.  One week prior to surgery: Stop Anti-inflammatories (NSAIDS) such as Advil, Aleve, Ibuprofen, Motrin, Naproxen, Naprosyn and Aspirin based products such as Excedrin, Goodys Powder, BC Powder. Stop ANY OVER THE COUNTER supplements until after surgery. You may however, continue to take Tylenol if needed for pain up until the day of surgery.  No Alcohol for 24 hours before or after surgery.  No Smoking including e-cigarettes for 24 hours prior  to surgery.  No chewable tobacco products for at least 6 hours prior to surgery.  No nicotine patches on the day of surgery.  Do not use any "recreational" drugs for at least a week prior to your surgery.  Please be advised that the combination of cocaine and anesthesia may have negative outcomes, up to and including death. If you test positive for cocaine, your surgery will be cancelled.  On the morning of surgery brush your teeth with toothpaste and water, you may rinse your mouth with mouthwash if you wish. Do not swallow any toothpaste or mouthwash.  Do not wear jewelry, make-up, hairpins, clips or nail polish.  Do not wear lotions, powders, or perfumes.   Do not shave body from the neck down 48 hours prior to surgery just in case you cut yourself which could leave a site for infection.  Also, freshly shaved skin may become irritated if using the CHG soap.  Contact lenses, hearing aids and dentures may not be worn into surgery.  Do not bring valuables to the hospital. [facility] is not responsible for any missing/lost belongings or valuables.   Use CHG Soap or wipes as directed on instruction sheet.   Notify your doctor if there is any change in your medical condition (cold, fever, infection).  Wear comfortable clothing (specific to your surgery type) to the hospital.  Plan for stool softeners for home use; pain medications have a tendency to cause constipation. You can also help prevent constipation by eating foods high in fiber such as fruits and vegetables and drinking plenty of fluids as your diet allows.  After surgery,  you can help prevent lung complications by doing breathing exercises.  Take deep breaths and cough every 1-2 hours. Your doctor may order a device called an Incentive Spirometer to help you take deep breaths. When coughing or sneezing, hold a pillow firmly against your incision with both hands. This is called "splinting." Doing this helps protect your  incision. It also decreases belly discomfort.  If you are being admitted to the hospital overnight, leave your suitcase in the car. After surgery it may be brought to your room.  If you are being discharged the day of surgery, you will not be allowed to drive home. You will need a responsible adult (18 years or older) to drive you home and stay with you that night.   If you are taking public transportation, you will need to have a responsible adult (18 years or older) with you. Please confirm with your physician that it is acceptable to use public transportation.   Please call the [facility] Dept. at [phone] if you have any questions about these instructions.  Surgery Visitation Policy:  Patients undergoing a surgery or procedure may have one family member or support person with them as long as that person is not COVID-19 positive or experiencing its symptoms.  That person may remain in the waiting area during the procedure.  Inpatient Visitation:    Visiting hours are 7 a.m. to 8 p.m. Inpatients will be allowed two visitors daily. The visitors may change each day during the patient's stay. No visitors under the age of 21. Any visitor under the age of 107 must be accompanied by an adult. The visitor must pass COVID-19 screenings, use hand sanitizer when entering and exiting the patient's room and wear a mask at all times, including in the patient's room. Patients must also wear a mask when staff or their visitor are in the room. Masking is required regardless of vaccination status.  Total Hip/Knee Replacement Preoperative Educational Video  To better prepare for surgery, please view our videos that explain the physical activity and discharge planning required to have the best surgical recovery at [facility]  [email or web address]      Questions? Call [phone] or email [email or web address]

## 2020-06-15 MED ORDER — CHLORHEXIDINE GLUCONATE 0.12 % MT SOLN: 15.0000 mL | Freq: Once | OROMUCOSAL | Status: AC

## 2020-06-15 MED ORDER — ORAL CARE MOUTH RINSE: 15.0000 mL | Freq: Once | OROMUCOSAL | Status: AC

## 2020-06-15 MED ORDER — LACTATED RINGERS IV SOLN: INTRAVENOUS | Status: DC

## 2020-06-16 ENCOUNTER — Encounter
Admission: RE | Disposition: A | Discharge status: Skilled Nursing Facility | Payer: Self-pay | Source: Home / Self Care | Attending: Orthopedic Surgery

## 2020-06-16 ENCOUNTER — Inpatient Hospital Stay: Payer: Medicare Other

## 2020-06-16 ENCOUNTER — Other Ambulatory Visit: Payer: Self-pay

## 2020-06-16 ENCOUNTER — Inpatient Hospital Stay: Payer: Medicare Other | Admitting: Urgent Care

## 2020-06-16 ENCOUNTER — Encounter: Payer: Self-pay | Admitting: Orthopedic Surgery

## 2020-06-16 ENCOUNTER — Inpatient Hospital Stay
Admission: RE | Admit: 2020-06-16 | Discharge: 2020-06-21 | DRG: 470 | Disposition: A | Discharge status: Skilled Nursing Facility | Payer: Medicare Other | Attending: Orthopedic Surgery | Admitting: Orthopedic Surgery

## 2020-06-16 DIAGNOSIS — Z96649 Presence of unspecified artificial hip joint: Secondary | ICD-10-CM

## 2020-06-16 DIAGNOSIS — M25532 Pain in left wrist: Secondary | ICD-10-CM | POA: Diagnosis present

## 2020-06-16 DIAGNOSIS — E7800 Pure hypercholesterolemia, unspecified: Secondary | ICD-10-CM | POA: Diagnosis present

## 2020-06-16 DIAGNOSIS — Z7901 Long term (current) use of anticoagulants: Secondary | ICD-10-CM | POA: Diagnosis not present

## 2020-06-16 DIAGNOSIS — M25531 Pain in right wrist: Secondary | ICD-10-CM | POA: Diagnosis present

## 2020-06-16 DIAGNOSIS — K219 Gastro-esophageal reflux disease without esophagitis: Secondary | ICD-10-CM | POA: Diagnosis present

## 2020-06-16 DIAGNOSIS — Z96651 Presence of right artificial knee joint: Secondary | ICD-10-CM | POA: Diagnosis present

## 2020-06-16 DIAGNOSIS — G8918 Other acute postprocedural pain: Secondary | ICD-10-CM

## 2020-06-16 DIAGNOSIS — Z85828 Personal history of other malignant neoplasm of skin: Secondary | ICD-10-CM

## 2020-06-16 DIAGNOSIS — I480 Paroxysmal atrial fibrillation: Secondary | ICD-10-CM | POA: Diagnosis present

## 2020-06-16 DIAGNOSIS — M1612 Unilateral primary osteoarthritis, left hip: Principal | ICD-10-CM | POA: Diagnosis present

## 2020-06-16 DIAGNOSIS — I10 Essential (primary) hypertension: Secondary | ICD-10-CM | POA: Diagnosis present

## 2020-06-16 DIAGNOSIS — E785 Hyperlipidemia, unspecified: Secondary | ICD-10-CM | POA: Diagnosis present

## 2020-06-16 DIAGNOSIS — Z419 Encounter for procedure for purposes other than remedying health state, unspecified: Secondary | ICD-10-CM

## 2020-06-16 DIAGNOSIS — Z90710 Acquired absence of both cervix and uterus: Secondary | ICD-10-CM | POA: Diagnosis not present

## 2020-06-16 DIAGNOSIS — Z8616 Personal history of COVID-19: Secondary | ICD-10-CM | POA: Diagnosis not present

## 2020-06-16 DIAGNOSIS — Z96642 Presence of left artificial hip joint: Secondary | ICD-10-CM

## 2020-06-16 HISTORY — PX: TOTAL HIP ARTHROPLASTY: SHX124

## 2020-06-16 LAB — URINE CULTURE: Culture: >=100000 — AB

## 2020-06-16 SURGERY — ARTHROPLASTY, HIP, TOTAL, ANTERIOR APPROACH
Anesthesia: General | Site: Hip | Laterality: Left

## 2020-06-16 MED ORDER — ACETAMINOPHEN 325 MG PO TABS
325.0000 mg | ORAL_TABLET | Freq: Four times a day (QID) | ORAL | Status: DC | PRN
  Administered 2020-06-19 – 2020-06-20 (×2): 650 mg via ORAL
  Filled 2020-06-16 (×2): qty 2

## 2020-06-16 MED ORDER — APIXABAN 5 MG PO TABS
5.0000 mg | ORAL_TABLET | Freq: Two times a day (BID) | ORAL | Status: DC
  Administered 2020-06-17 – 2020-06-21 (×9): 5 mg via ORAL
  Filled 2020-06-16 (×9): qty 1

## 2020-06-16 MED ORDER — VITAMIN D 25 MCG (1000 UNIT) PO TABS
2000.0000 [IU] | ORAL_TABLET | Freq: Every day | ORAL | Status: DC
  Administered 2020-06-17 – 2020-06-21 (×5): 2000 [IU] via ORAL
  Filled 2020-06-16 (×5): qty 2

## 2020-06-16 MED ORDER — CHLORHEXIDINE GLUCONATE 0.12 % MT SOLN
OROMUCOSAL | Status: AC
  Administered 2020-06-16: 15 mL via OROMUCOSAL
  Filled 2020-06-16: qty 15

## 2020-06-16 MED ORDER — DEXAMETHASONE SODIUM PHOSPHATE 10 MG/ML IJ SOLN
INTRAMUSCULAR | Status: AC
  Filled 2020-06-16: qty 1

## 2020-06-16 MED ORDER — BUPIVACAINE-EPINEPHRINE (PF) 0.5% -1:200000 IJ SOLN
INTRAMUSCULAR | Status: AC
  Filled 2020-06-16: qty 30

## 2020-06-16 MED ORDER — CEFAZOLIN SODIUM-DEXTROSE 2-4 GM/100ML-% IV SOLN
2.0000 g | Freq: Four times a day (QID) | INTRAVENOUS | Status: AC
Start: 2020-06-16 — End: 2020-06-17
  Administered 2020-06-17: 2 g via INTRAVENOUS
  Filled 2020-06-16 (×2): qty 100

## 2020-06-16 MED ORDER — TRAMADOL HCL 50 MG PO TABS
50.0000 mg | ORAL_TABLET | Freq: Four times a day (QID) | ORAL | Status: DC
  Administered 2020-06-16 – 2020-06-21 (×14): 50 mg via ORAL
  Filled 2020-06-16 (×18): qty 1

## 2020-06-16 MED ORDER — DIPHENHYDRAMINE HCL 12.5 MG/5ML PO ELIX: 12.5000 mg | ORAL_SOLUTION | ORAL | Status: DC | PRN

## 2020-06-16 MED ORDER — HYDROCODONE-ACETAMINOPHEN 7.5-325 MG PO TABS
1.0000 | ORAL_TABLET | ORAL | Status: DC | PRN
  Administered 2020-06-17 – 2020-06-21 (×4): 1 via ORAL
  Filled 2020-06-16 (×4): qty 1

## 2020-06-16 MED ORDER — CEFAZOLIN SODIUM-DEXTROSE 2-4 GM/100ML-% IV SOLN
2.0000 g | INTRAVENOUS | Status: AC
  Administered 2020-06-16: 2 g via INTRAVENOUS

## 2020-06-16 MED ORDER — NEOMYCIN-POLYMYXIN B GU 40-200000 IR SOLN
Status: DC | PRN
  Administered 2020-06-16: 4 mL

## 2020-06-16 MED ORDER — KETAMINE HCL 50 MG/ML IJ SOLN
INTRAMUSCULAR | Status: DC | PRN
  Administered 2020-06-16: 50 mg via INTRAMUSCULAR

## 2020-06-16 MED ORDER — ONDANSETRON HCL 4 MG/2ML IJ SOLN
INTRAMUSCULAR | Status: AC
  Filled 2020-06-16: qty 2

## 2020-06-16 MED ORDER — ZOLPIDEM TARTRATE 5 MG PO TABS
5.0000 mg | ORAL_TABLET | Freq: Every evening | ORAL | Status: DC | PRN
  Filled 2020-06-16: qty 1

## 2020-06-16 MED ORDER — METOCLOPRAMIDE HCL 10 MG PO TABS: 5.0000 mg | ORAL_TABLET | Freq: Three times a day (TID) | ORAL | Status: DC | PRN

## 2020-06-16 MED ORDER — BISACODYL 10 MG RE SUPP: 10.0000 mg | Freq: Every day | RECTAL | Status: DC | PRN

## 2020-06-16 MED ORDER — ESCITALOPRAM OXALATE 10 MG PO TABS
10.0000 mg | ORAL_TABLET | Freq: Every day | ORAL | Status: DC
  Administered 2020-06-16 – 2020-06-20 (×5): 10 mg via ORAL
  Filled 2020-06-16 (×6): qty 1

## 2020-06-16 MED ORDER — PANTOPRAZOLE SODIUM 40 MG PO TBEC
80.0000 mg | DELAYED_RELEASE_TABLET | Freq: Every day | ORAL | Status: DC
  Administered 2020-06-17 – 2020-06-21 (×5): 80 mg via ORAL
  Filled 2020-06-16 (×5): qty 2

## 2020-06-16 MED ORDER — MENTHOL 3 MG MT LOZG
1.0000 | LOZENGE | OROMUCOSAL | Status: DC | PRN
  Filled 2020-06-16: qty 9

## 2020-06-16 MED ORDER — CEFAZOLIN SODIUM-DEXTROSE 2-4 GM/100ML-% IV SOLN
INTRAVENOUS | Status: AC
  Administered 2020-06-16: 2 g via INTRAVENOUS
  Filled 2020-06-16: qty 100

## 2020-06-16 MED ORDER — SODIUM CHLORIDE 0.9 % IV SOLN: INTRAVENOUS | Status: DC

## 2020-06-16 MED ORDER — KETAMINE HCL 50 MG/ML IJ SOLN
INTRAMUSCULAR | Status: AC
  Filled 2020-06-16: qty 1

## 2020-06-16 MED ORDER — ONDANSETRON HCL 4 MG PO TABS
4.0000 mg | ORAL_TABLET | Freq: Four times a day (QID) | ORAL | Status: DC | PRN
  Administered 2020-06-17: 4 mg via ORAL
  Filled 2020-06-16: qty 1

## 2020-06-16 MED ORDER — ACETAMINOPHEN 10 MG/ML IV SOLN
INTRAVENOUS | Status: DC | PRN
  Administered 2020-06-16: 1000 mg via INTRAVENOUS

## 2020-06-16 MED ORDER — ESTRADIOL 1 MG PO TABS
0.5000 mg | ORAL_TABLET | Freq: Every morning | ORAL | Status: DC
  Administered 2020-06-17 – 2020-06-21 (×5): 0.5 mg via ORAL
  Filled 2020-06-16 (×6): qty 0.5

## 2020-06-16 MED ORDER — BUPIVACAINE-EPINEPHRINE 0.25% -1:200000 IJ SOLN
INTRAMUSCULAR | Status: DC | PRN
  Administered 2020-06-16: 30 mL

## 2020-06-16 MED ORDER — POLYETHYLENE GLYCOL 3350 17 G PO PACK
17.0000 g | PACK | Freq: Every day | ORAL | Status: DC | PRN
  Administered 2020-06-17: 17 g via ORAL
  Filled 2020-06-16: qty 1

## 2020-06-16 MED ORDER — FENTANYL CITRATE (PF) 100 MCG/2ML IJ SOLN
INTRAMUSCULAR | Status: DC | PRN
  Administered 2020-06-16 (×2): 50 ug via INTRAVENOUS

## 2020-06-16 MED ORDER — ROCURONIUM BROMIDE 100 MG/10ML IV SOLN
INTRAVENOUS | Status: DC | PRN
  Administered 2020-06-16: 50 mg via INTRAVENOUS

## 2020-06-16 MED ORDER — FENTANYL CITRATE (PF) 100 MCG/2ML IJ SOLN
25.0000 ug | INTRAMUSCULAR | Status: DC | PRN
  Administered 2020-06-16: 25 ug via INTRAVENOUS

## 2020-06-16 MED ORDER — SODIUM CHLORIDE FLUSH 0.9 % IV SOLN
INTRAVENOUS | Status: AC
  Filled 2020-06-16: qty 40

## 2020-06-16 MED ORDER — SUCCINYLCHOLINE CHLORIDE 20 MG/ML IJ SOLN
INTRAMUSCULAR | Status: DC | PRN
  Administered 2020-06-16: 100 mg via INTRAVENOUS

## 2020-06-16 MED ORDER — METOCLOPRAMIDE HCL 5 MG/ML IJ SOLN
5.0000 mg | Freq: Three times a day (TID) | INTRAMUSCULAR | Status: DC | PRN
Start: 2020-06-16 — End: 2020-06-21

## 2020-06-16 MED ORDER — HYDROCODONE-ACETAMINOPHEN 5-325 MG PO TABS
1.0000 | ORAL_TABLET | ORAL | Status: DC | PRN
  Administered 2020-06-17: 1 via ORAL
  Administered 2020-06-19: 2 via ORAL
  Administered 2020-06-19: 1 via ORAL
  Filled 2020-06-16: qty 2
  Filled 2020-06-16 (×2): qty 1

## 2020-06-16 MED ORDER — ONDANSETRON HCL 4 MG/2ML IJ SOLN
INTRAMUSCULAR | Status: DC | PRN
  Administered 2020-06-16: 4 mg via INTRAVENOUS

## 2020-06-16 MED ORDER — ONDANSETRON HCL 4 MG/2ML IJ SOLN: 4.0000 mg | Freq: Four times a day (QID) | INTRAMUSCULAR | Status: DC | PRN

## 2020-06-16 MED ORDER — BUPIVACAINE LIPOSOME 1.3 % IJ SUSP
INTRAMUSCULAR | Status: AC
  Filled 2020-06-16: qty 20

## 2020-06-16 MED ORDER — PHENOL 1.4 % MT LIQD
1.0000 | OROMUCOSAL | Status: DC | PRN
  Filled 2020-06-16: qty 177

## 2020-06-16 MED ORDER — DOCUSATE SODIUM 100 MG PO CAPS
100.0000 mg | ORAL_CAPSULE | Freq: Two times a day (BID) | ORAL | Status: DC
  Administered 2020-06-16 – 2020-06-21 (×7): 100 mg via ORAL
  Filled 2020-06-16 (×10): qty 1

## 2020-06-16 MED ORDER — GABAPENTIN 300 MG PO CAPS
300.0000 mg | ORAL_CAPSULE | Freq: Three times a day (TID) | ORAL | Status: DC
  Administered 2020-06-16 – 2020-06-21 (×14): 300 mg via ORAL
  Filled 2020-06-16 (×14): qty 1

## 2020-06-16 MED ORDER — SUGAMMADEX SODIUM 200 MG/2ML IV SOLN
INTRAVENOUS | Status: DC | PRN
  Administered 2020-06-16: 200 mg via INTRAVENOUS

## 2020-06-16 MED ORDER — DRONEDARONE HCL 400 MG PO TABS
400.0000 mg | ORAL_TABLET | Freq: Two times a day (BID) | ORAL | Status: DC
  Administered 2020-06-16 – 2020-06-21 (×10): 400 mg via ORAL
  Filled 2020-06-16 (×12): qty 1

## 2020-06-16 MED ORDER — ACETAMINOPHEN 10 MG/ML IV SOLN
INTRAVENOUS | Status: AC
  Filled 2020-06-16: qty 100

## 2020-06-16 MED ORDER — ALUM & MAG HYDROXIDE-SIMETH 200-200-20 MG/5ML PO SUSP: 30.0000 mL | ORAL | Status: DC | PRN

## 2020-06-16 MED ORDER — AMLODIPINE BESYLATE 5 MG PO TABS
5.0000 mg | ORAL_TABLET | Freq: Every day | ORAL | Status: DC
  Administered 2020-06-17 – 2020-06-21 (×5): 5 mg via ORAL
  Filled 2020-06-16 (×5): qty 1

## 2020-06-16 MED ORDER — NEOMYCIN-POLYMYXIN B GU 40-200000 IR SOLN
Status: AC
  Filled 2020-06-16: qty 4

## 2020-06-16 MED ORDER — MAGNESIUM CITRATE PO SOLN
1.0000 | Freq: Once | ORAL | Status: DC | PRN
  Filled 2020-06-16: qty 296

## 2020-06-16 MED ORDER — POLYVINYL ALCOHOL 1.4 % OP SOLN
1.0000 [drp] | Freq: Three times a day (TID) | OPHTHALMIC | Status: DC | PRN
  Filled 2020-06-16: qty 15

## 2020-06-16 MED ORDER — PRAVASTATIN SODIUM 40 MG PO TABS
40.0000 mg | ORAL_TABLET | Freq: Every day | ORAL | Status: DC
  Administered 2020-06-16 – 2020-06-20 (×5): 40 mg via ORAL
  Filled 2020-06-16: qty 2
  Filled 2020-06-16: qty 1
  Filled 2020-06-16: qty 2
  Filled 2020-06-16 (×3): qty 1
  Filled 2020-06-16: qty 2
  Filled 2020-06-16: qty 1
  Filled 2020-06-16 (×2): qty 2
  Filled 2020-06-16: qty 1

## 2020-06-16 MED ORDER — PROPOFOL 10 MG/ML IV BOLUS
INTRAVENOUS | Status: DC | PRN
  Administered 2020-06-16: 120 mg via INTRAVENOUS

## 2020-06-16 MED ORDER — SODIUM CHLORIDE 0.9 % IV SOLN
INTRAVENOUS | Status: DC | PRN
  Administered 2020-06-16: 60 mL

## 2020-06-16 MED ORDER — FENTANYL CITRATE (PF) 100 MCG/2ML IJ SOLN
INTRAMUSCULAR | Status: AC
  Administered 2020-06-16: 25 ug via INTRAVENOUS
  Filled 2020-06-16: qty 2

## 2020-06-16 MED ORDER — LIDOCAINE HCL (CARDIAC) PF 100 MG/5ML IV SOSY
PREFILLED_SYRINGE | INTRAVENOUS | Status: DC | PRN
  Administered 2020-06-16: 40 mg via INTRAVENOUS

## 2020-06-16 MED ORDER — BUPIVACAINE-EPINEPHRINE (PF) 0.25% -1:200000 IJ SOLN
INTRAMUSCULAR | Status: AC
  Filled 2020-06-16: qty 30

## 2020-06-16 MED ORDER — MECLIZINE HCL 25 MG PO TABS
25.0000 mg | ORAL_TABLET | Freq: Every day | ORAL | Status: DC
  Administered 2020-06-16 – 2020-06-20 (×5): 25 mg via ORAL
  Filled 2020-06-16 (×6): qty 1

## 2020-06-16 MED ORDER — DEXAMETHASONE SODIUM PHOSPHATE 10 MG/ML IJ SOLN
INTRAMUSCULAR | Status: DC | PRN
  Administered 2020-06-16: 10 mg via INTRAVENOUS

## 2020-06-16 MED ORDER — MORPHINE SULFATE (PF) 2 MG/ML IV SOLN: 0.5000 mg | INTRAVENOUS | Status: DC | PRN

## 2020-06-16 SURGICAL SUPPLY — 61 items
BLADE SAGITTAL AGGR TOOTH XLG (BLADE) ×2 IMPLANT
BNDG COHESIVE 6X5 TAN STRL LF (GAUZE/BANDAGES/DRESSINGS) ×6 IMPLANT
CANISTER SUCT 1200ML W/VALVE (MISCELLANEOUS) ×2 IMPLANT
CANISTER WOUND CARE 500ML ATS (WOUND CARE) ×2 IMPLANT
CHLORAPREP W/TINT 26 (MISCELLANEOUS) ×2 IMPLANT
COVER BACK TABLE REUSABLE LG (DRAPES) ×2 IMPLANT
COVER WAND RF STERILE (DRAPES) ×2 IMPLANT
DRAPE 3/4 80X56 (DRAPES) ×6 IMPLANT
DRAPE C-ARM XRAY 36X54 (DRAPES) ×2 IMPLANT
DRAPE INCISE IOBAN 66X60 STRL (DRAPES) IMPLANT
DRAPE POUCH INSTRU U-SHP 10X18 (DRAPES) ×2 IMPLANT
DRESSING SURGICEL FIBRLLR 1X2 (HEMOSTASIS) ×2 IMPLANT
DRSG MEPILEX SACRM 8.7X9.8 (GAUZE/BANDAGES/DRESSINGS) ×2 IMPLANT
DRSG OPSITE POSTOP 4X8 (GAUZE/BANDAGES/DRESSINGS) ×4 IMPLANT
DRSG SURGICEL FIBRILLAR 1X2 (HEMOSTASIS) ×4
ELECT BLADE 6.5 EXT (BLADE) ×2 IMPLANT
ELECT REM PT RETURN 9FT ADLT (ELECTROSURGICAL) ×2
ELECTRODE REM PT RTRN 9FT ADLT (ELECTROSURGICAL) ×1 IMPLANT
GLOVE SURG SYN 9.0  PF PI (GLOVE) ×2
GLOVE SURG SYN 9.0 PF PI (GLOVE) ×2 IMPLANT
GLOVE SURG UNDER POLY LF SZ9 (GLOVE) ×2 IMPLANT
GOWN SRG 2XL LVL 4 RGLN SLV (GOWNS) ×1 IMPLANT
GOWN STRL NON-REIN 2XL LVL4 (GOWNS) ×1
GOWN STRL REUS W/ TWL LRG LVL3 (GOWN DISPOSABLE) ×1 IMPLANT
GOWN STRL REUS W/TWL LRG LVL3 (GOWN DISPOSABLE) ×1
HEMOVAC 400CC 10FR (MISCELLANEOUS) IMPLANT
HIP FEM HD L 28 (Head) ×2 IMPLANT
HOLDER FOLEY CATH W/STRAP (MISCELLANEOUS) ×2 IMPLANT
HOOD PEEL AWAY FLYTE STAYCOOL (MISCELLANEOUS) ×2 IMPLANT
IRRIGATION SURGIPHOR STRL (IV SOLUTION) IMPLANT
KIT PREVENA INCISION MGT 13 (CANNISTER) ×2 IMPLANT
LINER DUAL MOB 50MM (Liner) ×2 IMPLANT
MANIFOLD NEPTUNE II (INSTRUMENTS) ×2 IMPLANT
MASTERLOC HIP STD S4 (Hips) ×2 IMPLANT
MAT ABSORB  FLUID 56X50 GRAY (MISCELLANEOUS) ×1
MAT ABSORB FLUID 56X50 GRAY (MISCELLANEOUS) ×1 IMPLANT
NDL SAFETY ECLIPSE 18X1.5 (NEEDLE) ×1 IMPLANT
NEEDLE HYPO 18GX1.5 SHARP (NEEDLE) ×1
NEEDLE SPNL 20GX3.5 QUINCKE YW (NEEDLE) ×4 IMPLANT
NS IRRIG 1000ML POUR BTL (IV SOLUTION) ×2 IMPLANT
PACK HIP COMPR (MISCELLANEOUS) ×2 IMPLANT
SCALPEL PROTECTED #10 DISP (BLADE) ×4 IMPLANT
SHELL ACETABULAR SZ0 50 DME (Shell) ×2 IMPLANT
SOL PREP PVP 2OZ (MISCELLANEOUS) ×2
SOLUTION PREP PVP 2OZ (MISCELLANEOUS) ×1 IMPLANT
SPONGE DRAIN TRACH 4X4 STRL 2S (GAUZE/BANDAGES/DRESSINGS) ×2 IMPLANT
STAPLER SKIN PROX 35W (STAPLE) ×2 IMPLANT
STRAP SAFETY 5IN WIDE (MISCELLANEOUS) ×2 IMPLANT
SUT DVC 2 QUILL PDO  T11 36X36 (SUTURE) ×1
SUT DVC 2 QUILL PDO T11 36X36 (SUTURE) ×1 IMPLANT
SUT SILK 0 (SUTURE) ×1
SUT SILK 0 30XBRD TIE 6 (SUTURE) ×1 IMPLANT
SUT V-LOC 90 ABS DVC 3-0 CL (SUTURE) ×2 IMPLANT
SUT VIC AB 1 CT1 36 (SUTURE) ×2 IMPLANT
SYR 20ML LL LF (SYRINGE) ×2 IMPLANT
SYR 30ML LL (SYRINGE) ×2 IMPLANT
SYR 50ML LL SCALE MARK (SYRINGE) ×4 IMPLANT
SYR BULB IRRIG 60ML STRL (SYRINGE) ×2 IMPLANT
TAPE MICROFOAM 4IN (TAPE) ×2 IMPLANT
TOWEL OR 17X26 4PK STRL BLUE (TOWEL DISPOSABLE) ×2 IMPLANT
TRAY FOLEY MTR SLVR 16FR STAT (SET/KITS/TRAYS/PACK) ×2 IMPLANT

## 2020-06-16 NOTE — Progress Notes (Signed)
PT Cancellation Note  Patient Details Name: [name] MRN: [id number] DOB: [birth year]   Cancelled Treatment:    Reason Eval/Treat Not Completed: Fatigue/lethargy limiting ability to participate. Pt found sleeping soundly in room.  Per son sitting at bedside patient anesthesia plan changed from epidural to general and pt has been lethargic/sleeping since end of surgery. Pt's son requested no PT services this date. Will attempt to see pt at a future date/time as medically appropriate.     Linus Salmons PT, DPT 06/16/20, 4:47 PM

## 2020-06-16 NOTE — Anesthesia Procedure Notes (Signed)
Procedure Name: Intubation Date/Time: 06/16/2020 12:51 PM Performed by: Jonna Clark, CRNA Pre-anesthesia Checklist: Patient identified, Patient being monitored, Timeout performed, Emergency Drugs available and Suction available Patient Re-evaluated:Patient Re-evaluated prior to induction Oxygen Delivery Method: Circle system utilized Preoxygenation: Pre-oxygenation with 100% oxygen Induction Type: IV induction Ventilation: Mask ventilation without difficulty Laryngoscope Size: 3 and McGraph Grade View: Grade I Tube type: Oral Tube size: 7.0 mm Number of attempts: 1 Airway Equipment and Method: Stylet Placement Confirmation: ETT inserted through vocal cords under direct vision,  positive ETCO2 and breath sounds checked- equal and bilateral Secured at: 21 cm Tube secured with: Tape Dental Injury: Teeth and Oropharynx as per pre-operative assessment

## 2020-06-16 NOTE — Transfer of Care (Signed)
Immediate Anesthesia Transfer of Care Note  Patient: [name]  Procedure(s) Performed: TOTAL HIP ARTHROPLASTY ANTERIOR APPROACH (Left Hip)  Patient Location: PACU  Anesthesia Type:General  Level of Consciousness: drowsy and patient cooperative  Airway & Oxygen Therapy: Patient Spontanous Breathing and Patient connected to face mask oxygen  Post-op Assessment: Report given to RN and Post -op Vital signs reviewed and stable  Post vital signs: Reviewed and stable  Last Vitals:  Vitals Value Taken Time  BP 121/95 06/16/20 1430  Temp 36.8 C 06/16/20 1420  Pulse 74 06/16/20 1439  Resp 16 06/16/20 1439  SpO2 97 % 06/16/20 1439  Vitals shown include unvalidated device data.  Last Pain:  Vitals:   06/16/20 1430  TempSrc:   PainSc: Asleep         Complications: No complications documented.

## 2020-06-16 NOTE — Op Note (Signed)
06/16/2020  2:22 PM  PATIENT:  [name]  80 y.o. 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  PROCEDURE:  Procedure(s): TOTAL HIP ARTHROPLASTY ANTERIOR APPROACH (Left)  SURGEON: Laurene Footman, MD  ASSISTANTS: none  ANESTHESIA:   general  EBL:  Total I/O In: 200 [IV Piggyback:200] Out: -   BLOOD ADMINISTERED:none  DRAINS: wound vac   LOCAL MEDICATIONS USED:  MARCAINE   And Exparel  SPECIMEN:  Source of Specimen:  left femoral head  DISPOSITION OF SPECIMEN:  PATHOLOGY  COUNTS:  YES  TOURNIQUET:  * No tourniquets in log *  IMPLANTS: Medacta Masterloc 4 std, 50 mm Mpact DM cup and linger, L 28 mm head  DICTATION: .Dragon Dictation   The patient was brought to the operating room and after generall anesthesia was obtained pateint was placed on the operative table with the ipsilateral foot into the Medacta attachment, contralateral leg on a well-padded table. C-arm was brought in and preop template x-ray taken. After prepping and draping in usual sterile fashion appropriate patient identification and timeout procedures were completed. Anterior approach to the hip was obtained and centered over the greater trochanter and TFL muscle. The subcutaneous tissue was incised hemostasis being achieved by electrocautery. TFL fascia was incised and the muscle retracted laterally deep retractor placed. The lateral femoral circumflex vessels were identified and ligated. The anterior capsule was exposed and a capsulotomy performed. The neck was identified and a femoral neck cut carried out with a saw. The head was removed without difficulty and showed sclerotic femoral head and acetabulum. Reaming was carried out to 50 mm and a 50 mm cup trial gave appropriate tightness to the acetabular component a 50 mm DM cup was impacted into position. The leg was then externally rotated and ischiofemoral  and pubofemoral releases carried out. The femur was sequentially broached to a size 4, size 4 masterloc sdt with S then L heads trials were placed and the final components chosen. The 4 standard stem was inserted along with a L metal  28 mm head and 50 mm liner. The hip was reduced and was stable the wound was thoroughly irrigated with fibrillar placed along the posterior capsule and medial neck. The deep fascia ws closed using a heavy Quill after infiltration of 30 cc of quarter percent Sensorcaine with epinephrine.3-0 V-loc to close the skin with skin staples. Xeroform 4 x 4's ABDs and tape patient was sent to recovery in stable condition.   PLAN OF CARE: Admit to inpatient

## 2020-06-16 NOTE — Plan of Care (Signed)
Pt admitted successfully still drowsy from anesthesia

## 2020-06-16 NOTE — Anesthesia Preprocedure Evaluation (Signed)
Anesthesia Evaluation  Patient identified by MRN, date of birth, ID band Patient awake    Reviewed: Allergy & Precautions, NPO status , Patient's Chart, lab work & pertinent test results  History of Anesthesia Complications Negative for: history of anesthetic complications  Airway Mallampati: III  TM Distance: <3 FB Neck ROM: full    Dental  (+) Chipped   Pulmonary neg pulmonary ROS, neg shortness of breath,    Pulmonary exam normal        Cardiovascular Exercise Tolerance: Good hypertension, (-) angina(-) Past MI and (-) DOE Normal cardiovascular exam+ dysrhythmias Atrial Fibrillation      Neuro/Psych PSYCHIATRIC DISORDERS negative neurological ROS     GI/Hepatic Neg liver ROS, GERD  Medicated and Controlled,  Endo/Other  negative endocrine ROS  Renal/GU      Musculoskeletal   Abdominal   Peds  Hematology negative hematology ROS (+)   Anesthesia Other Findings Past Medical History: No date: Anxiety No date: Aortic atherosclerosis (HCC) No date: Current use of long term anticoagulation     Comment:  APIXABAN No date: Dysrhythmia     Comment:  A-fib No date: GERD (gastroesophageal reflux disease) 07/2019: History of 2019 novel coronavirus disease (COVID-19) No date: Hypercholesteremia No date: Hypertension No date: IDA (iron deficiency anemia) No date: Paroxysmal atrial fibrillation (HCC) No date: Skin cancer     Comment:  Skin - squamous  Past Surgical History: No date: ABDOMINAL HYSTERECTOMY 11/21/2015: BREAST CYST ASPIRATION; Left     Comment:  resolved 1973: BREAST EXCISIONAL BIOPSY; Left     Comment:  benign 07/13/2014: CATARACT EXTRACTION W/PHACO; Right     Comment:  Procedure: CATARACT EXTRACTION PHACO AND INTRAOCULAR               LENS PLACEMENT ([place]);  Surgeon: Leandrew Koyanagi, MD;               Location: [place] [facility];  Service: Ophthalmology;                Laterality:  Right; 10/05/2014: CATARACT EXTRACTION W/PHACO; Left     Comment:  Procedure: CATARACT EXTRACTION PHACO AND INTRAOCULAR               LENS PLACEMENT ([facility]);  Surgeon: Leandrew Koyanagi, MD;               Location: [place];  Service: Ophthalmology;                Laterality: Left; No date: JOINT REPLACEMENT     Comment:  knee replacement right 05/10/2020: LUMBAR LAMINECTOMY/DECOMPRESSION MICRODISCECTOMY; N/A     Comment:  Procedure: L3-5 DECOMPRESSION;  Surgeon: Meade Maw, MD;  Location: [facility];  Service: Neurosurgery;              Laterality: N/A; No date: SEPTOPLASTY  BMI    Body Mass Index: 32.30 kg/m      Reproductive/Obstetrics negative OB ROS                             Anesthesia Physical Anesthesia Plan  ASA: III  Anesthesia Plan: General ETT   Post-op Pain Management:    Induction: Intravenous  PONV Risk Score and Plan: Ondansetron, Dexamethasone, Midazolam and Treatment may vary due to age or medical condition  Airway Management Planned: Oral ETT  Additional Equipment:   Intra-op Plan:  Post-operative Plan: Extubation in OR  Informed Consent: I have reviewed the patients History and Physical, chart, labs and discussed the procedure including the risks, benefits and alternatives for the proposed anesthesia with the patient or authorized representative who has indicated his/her understanding and acceptance.     Dental Advisory Given  Plan Discussed with: Anesthesiologist, CRNA and Surgeon  Anesthesia Plan Comments: (Patient was not 72 hours our from her last Eliquis dose so she was consented for risks of anesthesia including but not limited to:  - adverse reactions to medications - damage to eyes, teeth, lips or other oral mucosa - nerve damage due to positioning  - sore throat or hoarseness - Damage to heart, brain, nerves, lungs, other parts of body or loss of life  Patient voiced  understanding.)        Anesthesia Quick Evaluation

## 2020-06-16 NOTE — H&P (Signed)
Back to top of Progress Notes [name], [place] - 06/09/2020 1:45 PM EDT Formatting of this note is different from the original. Images from the original note were not included. Chief Complaint: Chief Complaint  Patient presents with  . Osteoarthritis of the left hip  . Right wrist pain  [place] about a week ago having pain in wrist   [name] is a 80 y.o. female who presents today for evaluation of severe left groin and hip pain. Patient has x-ray showing advanced osteoarthritis of the left hip with complete loss of joint space and subchondral cyst formation in the femoral head and superior acetabulum. Patient states for 4 months she has had progressive groin, lateral hip buttocks and thigh pain on the left side. 4 weeks ago she underwent L3 L5 decompression, and is doing very well and states her back pain is much improved. She is currently taking gabapentin, hydrocodone, Robaxin, mostly for her left hip pain. She is able to walk a few steps with a walker but her mobility is severely limited. Patient is requiring a lot of assistance at home with family due to limitation in ADLs. Pain is moderate to severe.  No history of blood clots. She is not diabetic. She has history of A. fib and is on Eliquis.  Patient also complains of some left wrist pain from a fall 4 weeks ago, complains of some swelling and tightness throughout the wrist. Mild soreness. Symptoms are improving. No numbness or tingling.  Past Medical History: Past Medical History:  Diagnosis Date  . Accelerated hypertension  . Anemia, unspecified  . Atrial fibrillation (CMS-HCC) onset 11/2012  . Carpal tunnel syndrome  . Chickenpox  . COVID-19 07/2019  . Degenerative arthritis  . Diverticulosis  . Enthesopathy of hip region  . Essential hypertension, benign  . Fibrocystic breast disease  . Internal hemorrhoids  . 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  . Irritable bowel syndrome  .  Other and unspecified hyperlipidemia  . Postmenopausal  on HRT  . Situational anxiety  . Situational stress  . Vaginal yeast infection  . Valvular heart disease   Past Surgical History: Past Surgical History:  Procedure Laterality Date  . nasal surgery by Dr. Caryn Section several years ago for a deviated septum  . CATARACT EXTRACTION Right 07/13/2014  Dr. Wallace Going  . CATARACT EXTRACTION Left 10/05/2014  Dr. Wallace Going  . COLONOSCOPY 10/16/1993  . EGD 08/06/2005  . HYSTERECTOMY age 80  partial  . JOINT REPLACEMENT Right 01/17/2011  Right total knee replacement.  . L3-5 DECOMPRESSION 05/10/2020  Dr. Meade Maw at [facility]  . Multiple breast biopsies   Past Family History: Family History  Problem Relation Age of Onset  . Myocardial Infarction (Heart attack) Father  . Coronary Artery Disease (Blocked arteries around heart) Father  . Dementia Mother   Medications: Current Outpatient Medications Ordered in Epic  Medication Sig Dispense Refill  . amLODIPine (NORVASC) 5 MG tablet TAKE ONE TABLET EVERY DAY 90 tablet 1  . cholecalciferol (VITAMIN D3) 2,000 unit tablet Take 2,000 Units by mouth once daily.  Marland Kitchen ELIQUIS 5 mg tablet TAKE ONE TABLET TWICE DAILY 180 tablet 1  . escitalopram oxalate (LEXAPRO) 10 MG tablet TAKE ONE TABLET EVERY DAY 90 tablet 0  . estradioL (ESTRACE) 0.5 MG tablet TAKE ONE TABLET BY MOUTH EVERY DAY 90 tablet 1  . gabapentin (NEURONTIN) 300 MG capsule Take 1 capsule (300 mg total) by mouth 3 (three) times daily 90 capsule 11  .  HYDROcodone-acetaminophen (NORCO) 5-325 mg tablet Take 1-2 tablets by mouth every 4 (four) hours as needed for Pain for up to 15 days 60 tablet 0  . meclizine (ANTIVERT) 25 mg tablet TAKE ONE TABLET BY MOUTH 3 TIMES DAILY AS NEEDED FOR DIZZINESS FOR UP TO 10 DAYS 30 tablet 1  . methocarbamoL (ROBAXIN) 500 MG tablet Take 1 tablet (500 mg total) by mouth 4 (four) times daily as needed (spasms) for up to 30 days 120 tablet 0  . MULTAQ 400  mg tablet TAKE ONE TABLET BY MOUTH TWICE DAILY WITH MEALS 180 tablet 3  . multivitamin tablet Take 1 tablet by mouth once daily.  Marland Kitchen omeprazole (PRILOSEC) 20 MG DR capsule TAKE 1 CAPSULE TWICE DAILY 180 capsule 0  . pravastatin (PRAVACHOL) 40 MG tablet TAKE ONE TABLET AT BEDTIME 90 tablet 3   No current Epic-ordered facility-administered medications on file.   Allergies: Allergies  Allergen Reactions  . Morphine Other (See Comments)  Disoriented  . Oxycontin [Oxycodone] Other (See Comments)  Disoriented    Review of Systems:  A comprehensive 14 point ROS was performed, reviewed by me today, and the pertinent orthopaedic findings are documented in the HPI.  Exam: Wt 79.8 kg (176 lb)  BMI 32.19 kg/m  General:  Well developed, well nourished, no apparent distress, normal affect, patient presents in a wheelchair.  HEENT: Head normocephalic, atraumatic, PERRL.   Abdomen: Soft, non tender, non distended, Bowel sounds present.  Heart: Examination of the heart reveals regular, rate, and rhythm. There is no murmur noted on ascultation. There is a normal apical pulse.  Lungs: Lungs are clear to auscultation. There is no wheeze, rhonchi, or crackles. There is normal expansion of bilateral chest walls.   Left hip: Examination of the left hip shows tenderness over the trochanteric bursa with 15 degrees of left hip internal rotation with stiffness. Left hip internal rotation causes severe groin, buttocks and anterior thigh pain. She has normal right hip range of motion with no discomfort. She is neurovascular intact in left lower extremity with no neurological deficits with no swelling warmth erythema or edema throughout the left leg.  Left wrist: Normal active and passive range of motion of the left wrist with no catching or clicking or popping with pronation or supination. She is nontender to palpation with no noticeable swelling. Nontender along the [facility] joint with a negative CMC grind  test. No tenderness along the radial styloid negative Finkelstein's test. No swelling or tenderness throughout the carpals. No navicular tenderness  AP pelvis and lateral view of the left hip ordered interpreted by me in the office today. Impression: Patient has severe degenerative changes of the left hip joint in the superior acetabulum with complete loss of joint space and moderate sclerotic changes with subchondral cyst formation throughout the acetabulum with sclerotic changes and subchondral cyst formation in the femoral head. Slight flattening of the femoral head with large superior acetabular spur. Mild central joint space narrowing.  AP lateral oblique views of the left wrist are ordered interpreted by me in the office today. Impression: No evidence of acute bony abnormality. Mild sclerotic changes along the radiocarpal joint. Adequate space throughout the carpals. Severe degenerative changes of the first [facility] joint of the thumb. No evidence of acute bony abnormality.  Impression: Primary osteoarthritis of left hip [M16.12] Primary osteoarthritis of left hip (primary encounter diagnosis) Right wrist pain  Plan:  23. 80 year old female with severe left hip osteoarthritis that has been progressively getting worse over  the last 4 months to the point where she is minimally ambulating with a walker and needing a lot of assistance from family members to care for herself and activities of daily living. She is 4 weeks out from L3-L5 decompression by Dr. Cari Caraway. Her back is doing well. Dr. Cari Caraway has cleared her for left total hip arthroplasty. Risks, benefits, complications of a left total hip arthroplasty have been discussed with the patient. Patient has agreed and consented the procedure with Dr. Rudene Christians. 2. Left wrist pain. No evidence of fracture. Does have some arthritic changes along the [facility] joint but no pain along the [facility] joint on exam. Patient with mild left wrist sprain. Recommend she  progress activity as tolerated with no restrictions to the left wrist.  This note was generated in part with voice recognition software and I apologize for any typographical errors that were not detected and corrected.  [name] MPA-C   Electronically signed by [name], PA at 06/12/2020 10:40 AM EDT   Reviewed  H+P. No changes noted.

## 2020-06-17 LAB — BASIC METABOLIC PANEL
Anion gap: 7 (ref 5–15)
BUN: 17 mg/dL (ref 8–23)
CO2: 23 mmol/L (ref 22–32)
Calcium: 8.2 mg/dL — ABNORMAL LOW (ref 8.9–10.3)
Chloride: 108 mmol/L (ref 98–111)
Creatinine, Ser: 1.07 mg/dL — ABNORMAL HIGH (ref 0.44–1.00)
GFR, Estimated: 52 mL/min — ABNORMAL LOW (ref >60)
Glucose, Bld: 154 mg/dL — ABNORMAL HIGH (ref 70–99)
Potassium: 4.4 mmol/L (ref 3.5–5.1)
Sodium: 138 mmol/L (ref 135–145)

## 2020-06-17 LAB — CBC
HCT: 29.2 % — ABNORMAL LOW (ref 36.0–46.0)
Hemoglobin: 9.7 g/dL — ABNORMAL LOW (ref 12.0–15.0)
MCH: 31.4 pg (ref 26.0–34.0)
MCHC: 33.2 g/dL (ref 30.0–36.0)
MCV: 94.5 fL (ref 80.0–100.0)
Platelets: 298 10*3/uL (ref 150–400)
RBC: 3.09 MIL/uL — ABNORMAL LOW (ref 3.87–5.11)
RDW: 13.1 % (ref 11.5–15.5)
WBC: 13.1 10*3/uL — ABNORMAL HIGH (ref 4.0–10.5)
nRBC: 0 % (ref 0.0–0.2)

## 2020-06-17 NOTE — Evaluation (Signed)
Physical Therapy Evaluation Patient Details Name: [name] MRN: [id number] DOB: September 12, 1940 Today's Date: 06/17/2020   History of Present Illness  Cece is 80yo female that presents for L total hip arthroplasty (anterior approach). She has PMHx significant for osteoarthritis of the L hip, atrial fibrillation, and R wrist pain. She is s/p L3-L5 decompression x 4 weeks with improved back pain. Mobility has been severly limited d/t L progressive groin pain with ability to only walk a few steps with RW.  Clinical Impression  The pt presents this session in great spirits. She reports burning sensation in the L hip with standing and weight shifting that limits her progression with PT this session. Pain at it's worse is 6/10 but returns to 3/10 with rest. The pt is requiring SBA for transfers this session. It is expected that with continued pain control and progressive mobility the pt will be able to progress to d/c home with HHPT. PT will continue to follow in order to optimize functional mobility.     Follow Up Recommendations Home health PT    Equipment Recommendations  3in1 (PT)    Recommendations for Other Services       Precautions / Restrictions Precautions Precautions: Fall Restrictions Weight Bearing Restrictions: Yes LLE Weight Bearing: Weight bearing as tolerated      Mobility  Bed Mobility Overal bed mobility: Needs Assistance Bed Mobility: Supine to Sit     Supine to sit: Min assist;HOB elevated     General bed mobility comments: use of bed railing    Transfers Overall transfer level: Needs assistance Equipment used: Rolling walker (2 wheeled) Transfers: Sit to/from [facility] Sit to Stand: Min guard Stand pivot transfers: Min guard          Ambulation/Gait             General Gait Details: unable to ambulate d/t pain.  Stairs            Wheelchair Mobility    Modified Rankin (Stroke Patients Only)       Balance  Overall balance assessment: History of Falls;Needs assistance Sitting-balance support: No upper extremity supported;Feet supported Sitting balance-Leahy Scale: Good     Standing balance support: During functional activity;Bilateral upper extremity supported Standing balance-Leahy Scale: Fair                               Pertinent Vitals/Pain Pain Assessment: 0-10 Pain Score: 3  Pain Location: L groin Pain Descriptors / Indicators: Burning;Grimacing Pain Intervention(s): Limited activity within patient's tolerance;Repositioned;Monitored during session;Premedicated before session    Home Living Family/patient expects to be discharged to:: Private residence Living Arrangements: Alone Available Help at Discharge: Family;Friend(s) Type of Home: House Home Access: Stairs to enter Entrance Stairs-Rails: None Entrance Stairs-Number of Steps: 1 Home Layout: One level Home Equipment: Environmental consultant - 2 wheels;Walker - 4 wheels;Grab bars - toilet;Grab bars - tub/shower;Cane - single point      Prior Function Level of Independence: Needs assistance   Gait / Transfers Assistance Needed: Pt minimally ambulating prior to admission 2/2 pain.  ADL's / Homemaking Assistance Needed: Pt reporting sitting for most ADLs d/t hip/groin pain.        Hand Dominance        Extremity/Trunk Assessment   Upper Extremity Assessment Upper Extremity Assessment: Overall WFL for tasks assessed    Lower Extremity Assessment Lower Extremity Assessment: Overall WFL for tasks assessed  Communication   Communication: No difficulties  Cognition Arousal/Alertness: Awake/alert Behavior During Therapy: WFL for tasks assessed/performed Overall Cognitive Status: Within Functional Limits for tasks assessed                                        General Comments      Exercises     Assessment/Plan    PT Assessment Patient needs continued PT services  PT Problem List  Decreased strength;Decreased mobility;Decreased range of motion;Decreased activity tolerance       PT Treatment Interventions      PT Goals (Current goals can be found in the Care Plan section)  Acute Rehab PT Goals Patient Stated Goal: to return home and walk more PT Goal Formulation: With patient Time For Goal Achievement: 07/01/20 Potential to Achieve Goals: Fair    Frequency BID   Barriers to discharge        Co-evaluation               AM-PAC PT "6 Clicks" Mobility  Outcome Measure Help needed turning from your back to your side while in a flat bed without using bedrails?: A Little Help needed moving from lying on your back to sitting on the side of a flat bed without using bedrails?: A Little Help needed moving to and from a bed to a chair (including a wheelchair)?: A Little Help needed standing up from a chair using your arms (e.g., wheelchair or bedside chair)?: A Little Help needed to walk in hospital room?: A Lot Help needed climbing 3-5 steps with a railing? : A Lot 6 Click Score: 16    End of Session   Activity Tolerance: Patient limited by pain Patient left: in chair (OT present) Nurse Communication: Mobility status PT Visit Diagnosis: Difficulty in walking, not elsewhere classified (R26.2);History of falling (Z91.81);Pain Pain - Right/Left: Left Pain - part of body: Hip    Time: 1610-9604 PT Time Calculation (min) (ACUTE ONLY): 39 min   Charges:   PT Evaluation $PT Eval Moderate Complexity: 1 Mod PT Treatments $Gait Training: 23-37 mins        10:25 AM, 06/17/20  A. Saverio Danker PT, DPT Physical Therapist - [facility]  06/17/2020, 10:23 AM

## 2020-06-17 NOTE — Progress Notes (Signed)
  Subjective: 1 Day Post-Op Procedure(s) (LRB): TOTAL HIP ARTHROPLASTY ANTERIOR APPROACH (Left) Patient reports pain as well-controlled.   Patient is well, and has had no acute complaints or problems Plan is to go Home after hospital stay. Negative for chest pain and shortness of breath Fever: no Gastrointestinal: negative for nausea and vomiting.  Patient has not had a bowel movement.  Objective: Vital signs in last 24 hours: Temp:  [97.8 F (36.6 C)-98.7 F (37.1 C)] 98 F (36.7 C) (05/28 0808) Pulse Rate:  [66-77] 69 (05/28 0808) Resp:  [10-20] 20 (05/28 0808) BP: (101-140)/(26-95) 114/77 (05/28 0808) SpO2:  [95 %-100 %] 96 % (05/28 1016)  Intake/Output from previous day:  Intake/Output Summary (Last 24 hours) at 06/17/2020 1149 Last data filed at 06/17/2020 0449 Gross per 24 hour  Intake 2540.84 ml  Output --  Net 2540.84 ml    Intake/Output this shift: No intake/output data recorded.  Labs: Recent Labs    06/14/20 1302 06/17/20 0448  HGB 11.3* 9.7*   Recent Labs    06/14/20 1302 06/17/20 0448  WBC 8.7 13.1*  RBC 3.65* 3.09*  HCT 35.3* 29.2*  PLT 350 298   Recent Labs    06/14/20 1302 06/17/20 0448  NA 140 138  K 3.9 4.4  CL 105 108  CO2 25 23  BUN 19 17  CREATININE 1.25* 1.07*  GLUCOSE 84 154*  CALCIUM 8.8* 8.2*   No results for input(s): LABPT, INR in the last 72 hours.   EXAM General - Patient is Alert, Appropriate and Oriented Extremity - Neurovascular intact Dorsiflexion/Plantar flexion intact Compartment soft Dressing/Incision -Postoperative dressing remains in place. Motor Function - intact, moving foot and toes well on exam.     Assessment/Plan: 1 Day Post-Op Procedure(s) (LRB): TOTAL HIP ARTHROPLASTY ANTERIOR APPROACH (Left) Active Problems:   S/P hip replacement  Estimated body mass index is 32.3 kg/m as calculated from the following:   Height as of this encounter: 5\' 2"  (1.575 m).   Weight as of this encounter: 80.1  kg. Advance diet Up with therapy       DVT Prophylaxis - Lovenox and SCDs Weight-Bearing as tolerated to left leg  Cassell Smiles, PA-C [facility] 06/17/2020, 11:49 AM

## 2020-06-17 NOTE — Evaluation (Signed)
Occupational Therapy Evaluation Patient Details Name: [name] MRN: [id number] DOB: [birth year] Today's Date: 06/17/2020    History of Present Illness [name] is 80yo female that presents for L total hip arthroplasty (anterior approach). She has PMHx significant for osteoarthritis of the L hip, atrial fibrillation, and R wrist pain. She is s/p L3-L5 decompression x 4 weeks with improved back pain. Mobility has been severly limited d/t L progressive groin pain with ability to only walk a few steps with RW.   Clinical Impression   Patient presenting with decreased I in self care, balance, functional mobility/transfers, endurance, and safety awareness.  Patient reports living at home alone PTA. She uses microwave to heat meals or family picks up food for her. She has a neighbor she pays to clean her home. Pt endorses large family support at discharge.  Patient currently functioning at supervision/set up - min A ( LB dressing). OT discussed self care tasks and how to increase LE dressing independence at home. OT recommends 3 in 1 commode chair for safety at home.  Patient will benefit from acute OT to increase overall independence in the areas of ADLs, functional mobility, and safety awareness in order to safely discharge home.    Follow Up Recommendations  Home health OT    Equipment Recommendations  3 in 1 bedside commode       Precautions / Restrictions Precautions Precautions: Fall Restrictions Weight Bearing Restrictions: Yes LLE Weight Bearing: Weight bearing as tolerated      Mobility Bed Mobility Overal bed mobility: Needs Assistance Bed Mobility: Supine to Sit     Supine to sit: Min assist;HOB elevated     General bed mobility comments: seated in recliner chair    Transfers Overall transfer level: Needs assistance Equipment used: Rolling walker (2 wheeled) Transfers: Sit to/from [facility] Sit to Stand: Min guard Stand pivot transfers: Min guard             Balance Overall balance assessment: History of Falls;Needs assistance Sitting-balance support: No upper extremity supported;Feet supported Sitting balance-Leahy Scale: Good     Standing balance support: During functional activity;Bilateral upper extremity supported Standing balance-Leahy Scale: Fair                             ADL either performed or assessed with clinical judgement   ADL Overall ADL's : Needs assistance/impaired Eating/Feeding: Independent   Grooming: Wash/dry hands;Wash/dry face;Oral care;Set up;Sitting   Upper Body Bathing: Sitting;Modified independent   Lower Body Bathing: Sit to/from stand;Minimal assistance       Lower Body Dressing: Minimal assistance;Sit to/from stand               Functional mobility during ADLs: Min guard;Rolling walker       Vision Patient Visual Report: No change from baseline              Pertinent Vitals/Pain Pain Assessment: 0-10 Pain Score: 3  Pain Location: L groin Pain Descriptors / Indicators: Burning;Grimacing Pain Intervention(s): Limited activity within patient's tolerance;Repositioned;Premedicated before session     Hand Dominance Right   Extremity/Trunk Assessment Upper Extremity Assessment Upper Extremity Assessment: Overall WFL for tasks assessed   Lower Extremity Assessment Lower Extremity Assessment: Overall WFL for tasks assessed       Communication Communication Communication: No difficulties   Cognition Arousal/Alertness: Awake/alert Behavior During Therapy: WFL for tasks assessed/performed Overall Cognitive Status: Within Functional Limits for tasks assessed  Home Living Family/patient expects to be discharged to:: Private residence Living Arrangements: Alone Available Help at Discharge: Family;Friend(s) Type of Home: House Home Access: Stairs to enter [employer] of Steps:  1 Entrance Stairs-Rails: None Home Layout: One level     Bathroom Shower/Tub: Occupational psychologist: [place]: Environmental consultant - 2 wheels;Walker - 4 wheels;Grab bars - toilet;Grab bars - tub/shower;Cane - single point;Adaptive equipment Adaptive Equipment: Reacher        Prior Functioning/Environment Level of Independence: Needs assistance  Gait / Transfers Assistance Needed: Pt minimally ambulating prior to admission 2/2 pain. ADL's / Homemaking Assistance Needed: Pt reporting sitting for most ADLs d/t hip/groin pain. Communication / Swallowing Assistance Needed: no assistance required          OT Problem List: Decreased strength;Decreased activity tolerance;Impaired balance (sitting and/or standing);Decreased safety awareness;Decreased knowledge of use of DME or AE;Cardiopulmonary status limiting activity      OT Treatment/Interventions: Self-care/ADL training;Manual therapy;Therapeutic exercise;Patient/family education;Balance training;Energy conservation;Therapeutic activities;DME and/or AE instruction    OT Goals(Current goals can be found in the care plan section) Acute Rehab OT Goals Patient Stated Goal: to return home OT Goal Formulation: With patient Time For Goal Achievement: 07/01/20 Potential to Achieve Goals: Fair ADL Goals Pt Will Perform Grooming: with modified independence;standing Pt Will Perform Lower Body Dressing: with modified independence;sit to/from stand Pt Will Transfer to Toilet: with modified independence;ambulating Pt Will Perform Toileting - Clothing Manipulation and hygiene: with modified independence;sit to/from stand Pt Will Perform Tub/Shower Transfer: Shower transfer;with supervision;shower seat  OT Frequency: Min 2X/week   Barriers to D/C:    none known at this time          AM-PAC OT "6 Clicks" Daily Activity     Outcome Measure Help from another person eating meals?: None Help from another person taking care  of personal grooming?: None Help from another person toileting, which includes using toliet, bedpan, or urinal?: A Little Help from another person bathing (including washing, rinsing, drying)?: A Little Help from another person to put on and taking off regular upper body clothing?: None Help from another person to put on and taking off regular lower body clothing?: A Little 6 Click Score: 21   End of Session Equipment Utilized During Treatment: Rolling walker Nurse Communication: Mobility status  Activity Tolerance: Patient tolerated treatment well Patient left: in bed;with call bell/phone within reach  OT Visit Diagnosis: Unsteadiness on feet (R26.81);Repeated falls (R29.6);Muscle weakness (generalized) (M62.81)                Time: 0932-6712 OT Time Calculation (min): 29 min Charges:  OT General Charges $OT Visit: 1 Visit OT Evaluation $OT Eval Low Complexity: 1 Low OT Treatments $Self Care/Home Management : 8-22 mins   Darleen Crocker, MS, OTR/L , CBIS ascom [phone]  06/17/20, 12:02 PM  06/17/2020, 11:59 AM

## 2020-06-18 ENCOUNTER — Encounter: Payer: Self-pay | Admitting: Orthopedic Surgery

## 2020-06-18 LAB — CBC
HCT: 25.7 % — ABNORMAL LOW (ref 36.0–46.0)
Hemoglobin: 8.3 g/dL — ABNORMAL LOW (ref 12.0–15.0)
MCH: 31.3 pg (ref 26.0–34.0)
MCHC: 32.3 g/dL (ref 30.0–36.0)
MCV: 97 fL (ref 80.0–100.0)
Platelets: 291 10*3/uL (ref 150–400)
RBC: 2.65 MIL/uL — ABNORMAL LOW (ref 3.87–5.11)
RDW: 13.4 % (ref 11.5–15.5)
WBC: 12.1 10*3/uL — ABNORMAL HIGH (ref 4.0–10.5)
nRBC: 0 % (ref 0.0–0.2)

## 2020-06-18 MED ORDER — FE FUMARATE-B12-VIT C-FA-IFC PO CAPS
1.0000 | ORAL_CAPSULE | Freq: Two times a day (BID) | ORAL | Status: DC
  Administered 2020-06-18 – 2020-06-21 (×7): 1 via ORAL
  Filled 2020-06-18 (×9): qty 1

## 2020-06-18 NOTE — Progress Notes (Signed)
Physical Therapy Treatment Patient Details Name: [name] MRN: [id number] DOB: 1940-11-13 Today's Date: 06/18/2020    History of Present Illness [name] is 80yo female that presents for L total hip arthroplasty (anterior approach). She has PMHx significant for osteoarthritis of the L hip, atrial fibrillation, and R wrist pain. She is s/p L3-L5 decompression x 4 weeks with improved back pain. Mobility has been severly limited d/t L progressive groin pain with ability to only walk a few steps with RW.    PT Comments    Pt in chair ready for session.  Participated in exercises as described below.  Stood with min guard/assist and is able to very slowly with heavy dependence on RW to door of room and back with several self initiated rest breaks.  C/o burning pain with movement.    Overall progressing well.  She does state she hopes to discharge rehab.  She lives alone and with recent back surgery she feels she needs extra help.  Family is supportive but has been given increased assist the past 4 weeks and she does not feel like they can take on the extra care needed post hip surgery.  Given limited gait distances, heavy reliance on RW, pain and unable to care for herself and ADL/hosuehold mobility tasks at this time, will change DC recommendations at this time.   Follow Up Recommendations  SNF     Equipment Recommendations  Rolling walker with 5" wheels;3in1 (PT)    Recommendations for Other Services       Precautions / Restrictions Precautions Precautions: Fall Restrictions Weight Bearing Restrictions: Yes LLE Weight Bearing: Weight bearing as tolerated    Mobility  Bed Mobility Overal bed mobility: Needs Assistance             General bed mobility comments: seated in recliner chair    Transfers Overall transfer level: Needs assistance Equipment used: Rolling walker (2 wheeled) Transfers: Sit to/from Stand Sit to Stand: Min guard             Ambulation/Gait Ambulation/Gait assistance: Min Web designer (Feet): 20 Feet Assistive device: Rolling walker (2 wheeled) Gait Pattern/deviations: Step-to pattern;Decreased step length - right;Decreased step length - left Gait velocity: decreased       Stairs             Wheelchair Mobility    Modified Rankin (Stroke Patients Only)       Balance Overall balance assessment: History of Falls;Needs assistance Sitting-balance support: No upper extremity supported;Feet supported Sitting balance-Leahy Scale: Good     Standing balance support: During functional activity;Bilateral upper extremity supported Standing balance-Leahy Scale: Fair                              Cognition Arousal/Alertness: Awake/alert Behavior During Therapy: WFL for tasks assessed/performed Overall Cognitive Status: Within Functional Limits for tasks assessed                                        Exercises Other Exercises Other Exercises: seated and standing AROM.  Pt did supine HEP on her own prior to arrival.  Pleased with her ability to move LE more today    General Comments        Pertinent Vitals/Pain Pain Assessment: 0-10 Pain Score: 8  Pain Location: L groin Pain Descriptors / Indicators: Burning;Grimacing Pain Intervention(s):  Limited activity within patient's tolerance;Repositioned;Premedicated before session    Home Living                      Prior Function            PT Goals (current goals can now be found in the care plan section) Progress towards PT goals: Progressing toward goals    Frequency    BID      PT Plan Discharge plan needs to be updated    Co-evaluation              AM-PAC PT "6 Clicks" Mobility   Outcome Measure  Help needed turning from your back to your side while in a flat bed without using bedrails?: A Little Help needed moving from lying on your back to sitting on the side of a  flat bed without using bedrails?: A Little Help needed moving to and from a bed to a chair (including a wheelchair)?: A Little Help needed standing up from a chair using your arms (e.g., wheelchair or bedside chair)?: A Little Help needed to walk in hospital room?: A Lot Help needed climbing 3-5 steps with a railing? : A Lot 6 Click Score: 16    End of Session Equipment Utilized During Treatment: Gait belt Activity Tolerance: Patient limited by pain Patient left: in chair;with call bell/phone within reach Nurse Communication: Mobility status PT Visit Diagnosis: Difficulty in walking, not elsewhere classified (R26.2);History of falling (Z91.81);Pain Pain - Right/Left: Left Pain - part of body: Hip     Time: 3646-8032 PT Time Calculation (min) (ACUTE ONLY): 16 min  Charges:  $Gait Training: 8-22 mins                    Chesley Noon, PTA 06/18/20, 10:40 AM

## 2020-06-18 NOTE — Progress Notes (Signed)
  Subjective: 2 Days Post-Op Procedure(s) (LRB): TOTAL HIP ARTHROPLASTY ANTERIOR APPROACH (Left) Son at bedside. Patient reports pain as moderate.  Burning pain felt over hip intermittently with ambulation, no radicular symptoms down leg. Plan is to go Home after hospital stay. Negative for chest pain and shortness of breath Fever: no Gastrointestinal: negative for nausea and vomiting.   Patient has had a bowel movement. She reports 2x in the last 24 hr  Objective: Vital signs in last 24 hours: Temp:  [97.9 F (36.6 C)-98.5 F (36.9 C)] 97.9 F (36.6 C) (05/29 0753) Pulse Rate:  [63-69] 65 (05/29 0753) Resp:  [16-17] 16 (05/29 0753) BP: (102-139)/(50-80) 102/80 (05/29 0753) SpO2:  [92 %-95 %] 94 % (05/29 0753)  Intake/Output from previous day:  Intake/Output Summary (Last 24 hours) at 06/18/2020 1017 Last data filed at 06/17/2020 1605 Gross per 24 hour  Intake 650 ml  Output --  Net 650 ml    Intake/Output this shift: No intake/output data recorded.  Labs: Recent Labs    06/17/20 0448 06/18/20 0517  HGB 9.7* 8.3*   Recent Labs    06/17/20 0448 06/18/20 0517  WBC 13.1* 12.1*  RBC 3.09* 2.65*  HCT 29.2* 25.7*  PLT 298 291   Recent Labs    06/17/20 0448  NA 138  K 4.4  CL 108  CO2 23  BUN 17  CREATININE 1.07*  GLUCOSE 154*  CALCIUM 8.2*   No results for input(s): LABPT, INR in the last 72 hours.   EXAM General - Patient is Alert, Appropriate and Oriented Extremity - Neurovascular intact Dorsiflexion/Plantar flexion intact Compartment soft Dressing/Incision -Prevena in place and working, no drainage in Emergency planning/management officer Function - intact, moving foot and toes well on exam.    Cardiovascular- Regular rate and rhythm, no murmurs/rubs/gallops Respiratory- Lungs clear to auscultation bilaterally Gastrointestinal- soft, slightly tender in the RLQ,  and active bowel sounds   Assessment/Plan: 2 Days Post-Op Procedure(s) (LRB): TOTAL HIP ARTHROPLASTY  ANTERIOR APPROACH (Left) Active Problems:   S/P hip replacement  Estimated body mass index is 32.3 kg/m as calculated from the following:   Height as of this encounter: 5\' 2"  (1.575 m).   Weight as of this encounter: 80.1 kg. Advance diet Up with therapy  Current plan is for d/c home, but may change if patient is unable to make adequate progress with PT.     DVT Prophylaxis - Eliquis, Ted hose and SCDs Weight-Bearing as tolerated to left leg  Cassell Smiles, PA-C [facility] 06/18/2020, 10:17 AM

## 2020-06-18 NOTE — Anesthesia Postprocedure Evaluation (Signed)
Anesthesia Post Note  Patient: TONJA JEZEWSKI  Procedure(s) Performed: TOTAL HIP ARTHROPLASTY ANTERIOR APPROACH (Left Hip)  Patient location during evaluation: PACU Anesthesia Type: General Level of consciousness: awake and alert Pain management: pain level controlled Vital Signs Assessment: post-procedure vital signs reviewed and stable Respiratory status: spontaneous breathing, nonlabored ventilation, respiratory function stable and patient connected to nasal cannula oxygen Cardiovascular status: blood pressure returned to baseline and stable Postop Assessment: no apparent nausea or vomiting Anesthetic complications: no   No complications documented.   Last Vitals:  Vitals:   06/17/20 1952 06/18/20 0432  BP: (!) 128/50 139/60  Pulse: 63 66  Resp: 17 16  Temp: 36.8 C 36.9 C  SpO2: 92% 95%    Last Pain:  Vitals:   06/17/20 2116  TempSrc:   PainSc: 6                  Precious Haws 

## 2020-06-19 LAB — CBC
HCT: 25.5 % — ABNORMAL LOW (ref 36.0–46.0)
Hemoglobin: 8.3 g/dL — ABNORMAL LOW (ref 12.0–15.0)
MCH: 31 pg (ref 26.0–34.0)
MCHC: 32.5 g/dL (ref 30.0–36.0)
MCV: 95.1 fL (ref 80.0–100.0)
Platelets: 291 10*3/uL (ref 150–400)
RBC: 2.68 MIL/uL — ABNORMAL LOW (ref 3.87–5.11)
RDW: 13.3 % (ref 11.5–15.5)
WBC: 12.5 10*3/uL — ABNORMAL HIGH (ref 4.0–10.5)
nRBC: 0 % (ref 0.0–0.2)

## 2020-06-19 NOTE — NC FL2 (Signed)
[place] LEVEL OF CARE SCREENING TOOL     IDENTIFICATION  Patient Name: [name] Birthdate: [birth year] Sex: female Admission Date (Current Location): 06/16/2020  [place] and [place] Number:  Engineering geologist and Address:  [facility], [address], [place], [place] [address]      Provider Number: [id number]  Attending Physician Name and Address:  Hessie Knows, MD  Relative Name and Phone Number:  [name] son [phone]    Current Level of Care: Hospital Recommended Level of Care: [place] Prior Approval Number:    Date Approved/Denied:   PASRR Number: [id number]  Discharge Plan: SNF    Current Diagnoses: Patient Active Problem List   Diagnosis Date Noted  . S/P hip replacement 06/16/2020    Orientation RESPIRATION BLADDER Height & Weight     Self,Time,Situation,Place  Normal Continent Weight: 80.1 kg Height:  5\' 2"  (157.5 cm)  BEHAVIORAL SYMPTOMS/MOOD NEUROLOGICAL BOWEL NUTRITION STATUS      Continent Diet (regular)  AMBULATORY STATUS COMMUNICATION OF NEEDS Skin   Extensive Assist Verbally Surgical wounds                       Personal Care Assistance Level of Assistance  Bathing,Dressing Bathing Assistance: Limited assistance   Dressing Assistance: Limited assistance     Functional Limitations Info             SPECIAL CARE FACTORS FREQUENCY  PT (By licensed PT)     PT Frequency: 5 times per week              Contractures Contractures Info: Not present    Additional Factors Info  Code Status,Allergies Code Status Info: Full code Allergies Info: Aspirin, Nsaids, Methocarbamol, Morphine And Related, Oxycodone           Current Medications (06/19/2020):  This is the current hospital active medication list Current Facility-Administered Medications  Medication Dose Route Frequency Provider Last Rate Last Admin  . 0.9 %  sodium chloride infusion   Intravenous  Continuous Hessie Knows, MD   Stopped at 06/17/20 [phone]  . acetaminophen (TYLENOL) tablet 325-650 mg  325-650 mg Oral Q6H PRN Hessie Knows, MD      . alum & mag hydroxide-simeth (MAALOX/MYLANTA) 200-200-20 MG/5ML suspension 30 mL  30 mL Oral Q4H PRN Hessie Knows, MD      . amLODipine (NORVASC) tablet 5 mg  5 mg Oral Daily Hessie Knows, MD   5 mg at 06/19/20 5188  . apixaban (ELIQUIS) tablet 5 mg  5 mg Oral BID Hessie Knows, MD   5 mg at 06/19/20 4166  . bisacodyl (DULCOLAX) suppository 10 mg  10 mg Rectal Daily PRN Hessie Knows, MD      . cholecalciferol (VITAMIN D3) tablet 2,000 Units  2,000 Units Oral Daily Hessie Knows, MD   2,000 Units at 06/19/20 [phone]  . diphenhydrAMINE (BENADRYL) 12.5 MG/5ML elixir 12.5-25 mg  12.5-25 mg Oral Q4H PRN Hessie Knows, MD      . docusate sodium (COLACE) capsule 100 mg  100 mg Oral BID Hessie Knows, MD   100 mg at 06/19/20 1601  . dronedarone (MULTAQ) tablet 400 mg  400 mg Oral BID WC Hessie Knows, MD   400 mg at 06/19/20 1032  . escitalopram (LEXAPRO) tablet 10 mg  10 mg Oral QHS Hessie Knows, MD   10 mg at 06/18/20 2138  . estradiol (ESTRACE) tablet 0.5 mg  0.5 mg Oral q morning Rudene Christians,  Legrand Como, MD   0.5 mg at 06/19/20 1032  . ferrous [id number] C-folic acid (TRINSICON / FOLTRIN) capsule 1 capsule  1 capsule Oral BID PC Hessie Knows, MD   1 capsule at 06/19/20 1032  . gabapentin (NEURONTIN) capsule 300 mg  300 mg Oral TID Hessie Knows, MD   300 mg at 06/19/20 9021  . HYDROcodone-acetaminophen (NORCO) 7.5-325 MG per tablet 1-2 tablet  1-2 tablet Oral Q4H PRN Hessie Knows, MD   1 tablet at 06/18/20 [phone]  . HYDROcodone-acetaminophen (NORCO/VICODIN) 5-325 MG per tablet 1-2 tablet  1-2 tablet Oral Q4H PRN Hessie Knows, MD   2 tablet at 06/19/20 0933  . magnesium citrate solution 1 Bottle  1 Bottle Oral Once PRN Hessie Knows, MD      . meclizine (ANTIVERT) tablet 25 mg  25 mg Oral QHS Hessie Knows, MD   25 mg at 06/18/20 2137  .  menthol-cetylpyridinium (CEPACOL) lozenge 3 mg  1 lozenge Oral PRN Hessie Knows, MD       Or  . phenol (CHLORASEPTIC) mouth spray 1 spray  1 spray Mouth/Throat PRN Hessie Knows, MD      . metoCLOPramide (REGLAN) tablet 5-10 mg  5-10 mg Oral Q8H PRN Hessie Knows, MD       Or  . metoCLOPramide (REGLAN) injection 5-10 mg  5-10 mg Intravenous Q8H PRN Hessie Knows, MD      . morphine 2 MG/ML injection 0.5-1 mg  0.5-1 mg Intravenous Q2H PRN Hessie Knows, MD      . ondansetron [facility]) tablet 4 mg  4 mg Oral Q6H PRN Hessie Knows, MD   4 mg at 06/17/20 0846   Or  . ondansetron (ZOFRAN) injection 4 mg  4 mg Intravenous Q6H PRN Hessie Knows, MD      . pantoprazole (PROTONIX) EC tablet 80 mg  80 mg Oral Daily Hessie Knows, MD   80 mg at 06/19/20 2080  . polyethylene glycol (MIRALAX / GLYCOLAX) packet 17 g  17 g Oral Daily PRN Hessie Knows, MD   17 g at 06/17/20 0847  . polyvinyl alcohol (LIQUIFILM TEARS) 1.4 % ophthalmic solution 1 drop  1 drop Both Eyes TID PRN Hessie Knows, MD      . pravastatin (PRAVACHOL) tablet 40 mg  40 mg Oral QHS Hessie Knows, MD   40 mg at 06/18/20 2137  . traMADol (ULTRAM) tablet 50 mg  50 mg Oral Q6H Hessie Knows, MD   50 mg at 06/19/20 2233  . zolpidem (AMBIEN) tablet 5 mg  5 mg Oral QHS PRN Hessie Knows, MD         Discharge Medications: Please see discharge summary for a list of discharge medications.  Relevant Imaging Results:  Relevant Lab Results:   Additional Information SS# [id number]  Su Hilt, RN

## 2020-06-19 NOTE — Progress Notes (Signed)
  Subjective: 3 Days Post-Op Procedure(s) (LRB): TOTAL HIP ARTHROPLASTY ANTERIOR APPROACH (Left) Patient reports pain as somewhat improved from yesterday.   Patient is well, and has had no acute complaints or problems Plan is to go Skilled nursing facility after hospital stay. Negative for chest pain and shortness of breath Fever: no Gastrointestinal: negative for nausea and vomiting.  Patient has had a bowel movement.  Objective: Vital signs in last 24 hours: Temp:  [97.6 F (36.4 C)-98.6 F (37 C)] 98.1 F (36.7 C) (05/30 0747) Pulse Rate:  [66-75] 75 (05/30 0747) Resp:  [16-20] 16 (05/30 0747) BP: (124-151)/(47-58) 127/53 (05/30 0747) SpO2:  [92 %-98 %] 96 % (05/30 0747)  Intake/Output from previous day:  Intake/Output Summary (Last 24 hours) at 06/19/2020 1031 Last data filed at 06/18/2020 1351 Gross per 24 hour  Intake 120 ml  Output --  Net 120 ml    Intake/Output this shift: No intake/output data recorded.  Labs: Recent Labs    06/17/20 0448 06/18/20 0517 06/19/20 0554  HGB 9.7* 8.3* 8.3*   Recent Labs    06/18/20 0517 06/19/20 0554  WBC 12.1* 12.5*  RBC 2.65* 2.68*  HCT 25.7* 25.5*  PLT 291 291   Recent Labs    06/17/20 0448  NA 138  K 4.4  CL 108  CO2 23  BUN 17  CREATININE 1.07*  GLUCOSE 154*  CALCIUM 8.2*   No results for input(s): LABPT, INR in the last 72 hours.   EXAM General - Patient is Alert, Appropriate and Oriented Extremity - Neurovascular intact Dorsiflexion/Plantar flexion intact Compartment soft Dressing/Incision -Prevena in place and working, no significant drainage noted in Emergency planning/management officer Function - intact, moving foot and toes well on exam.  Cardiovascular- Regular rate and rhythm, no murmurs/rubs/gallops Respiratory- Lungs clear to auscultation bilaterally Gastrointestinal- soft and nontender   Assessment/Plan: 3 Days Post-Op Procedure(s) (LRB): TOTAL HIP ARTHROPLASTY ANTERIOR APPROACH (Left) Active Problems:    S/P hip replacement  Estimated body mass index is 32.3 kg/m as calculated from the following:   Height as of this encounter: 5\' 2"  (1.575 m).   Weight as of this encounter: 80.1 kg. Advance diet Up with therapy  Pl for d/c has been updated to SNF at this time.      DVT Prophylaxis - Eliquis, Ted hose and SCDs Weight-Bearing as tolerated to left leg  Cassell Smiles, PA-C [facility] 06/19/2020, 10:31 AM

## 2020-06-19 NOTE — Progress Notes (Signed)
Physical Therapy Treatment Patient Details Name: [name] MRN: [id number] DOB: 05-21-1940 Today's Date: 06/19/2020    History of Present Illness [name] is 80yo female that presents for L total hip arthroplasty (anterior approach). She has PMHx significant for osteoarthritis of the L hip, atrial fibrillation, and R wrist pain. She is s/p L3-L5 decompression x 4 weeks with improved back pain. Mobility has been severly limited d/t L progressive groin pain with ability to only walk a few steps with RW.    PT Comments    Patient is progressing well. She was able to tolerate LE exercise well this session with better mobility and reporting less discomfort. She continues to be slow with bed mobility and transfers. During gait, patient does exhibit antalgic gait with slower gait speed. However she reports she was not experiencing the severe burning pain this session that she did yesterday. She continues to be limited to short distances and as a result would have better outcomes with SNF rehab upon discharge. Patient agreeable for short term rehab upon discharge to improve mobility and overall activity level.     Follow Up Recommendations  SNF     Equipment Recommendations  Rolling walker with 5" wheels;3in1 (PT)    Recommendations for Other Services       Precautions / Restrictions Precautions Precautions: Fall Restrictions Weight Bearing Restrictions: Yes LLE Weight Bearing: Weight bearing as tolerated    Mobility  Bed Mobility Overal bed mobility: Needs Assistance Bed Mobility: Supine to Sit     Supine to sit: Min assist;HOB elevated     General bed mobility comments: required increased time and effort; slow to slide LLE off bed due to pain/weakness;    Transfers Overall transfer level: Needs assistance Equipment used: Rolling walker (2 wheeled) Transfers: Sit to/from Stand Sit to Stand: Min assist;From elevated surface         General transfer comment: with cues for LE  positioning and to increase forward lean; Patient also required elevated bed for better transfer ability;  Ambulation/Gait Ambulation/Gait assistance: Min assist Gait Distance (Feet): 30 Feet Assistive device: Rolling walker (2 wheeled) Gait Pattern/deviations: Step-to pattern;Decreased step length - right;Decreased step length - left Gait velocity: decreased   General Gait Details: slower gait speed, antalgic gait pattern left with decreased step length on RLE and short stance on LLE. Pt also required cues to stay inside RW especially during turns; Exhibits increased BUE weight bearing on RW for safety;   Stairs             Wheelchair Mobility    Modified Rankin (Stroke Patients Only)       Balance Overall balance assessment: History of Falls;Needs assistance Sitting-balance support: Feet supported;Bilateral upper extremity supported Sitting balance-Leahy Scale: Fair Sitting balance - Comments: leans on RW   Standing balance support: During functional activity;Bilateral upper extremity supported Standing balance-Leahy Scale: Fair                              Cognition Arousal/Alertness: Awake/alert Behavior During Therapy: WFL for tasks assessed/performed Overall Cognitive Status: Within Functional Limits for tasks assessed                                        Exercises Other Exercises Other Exercises: Instructed patient in supine exercise: ankle pump x15 reps, heel slides x15 reps, hip abduction/adduction  x15 reps; patient did require AAROM on LLE to increase ROM due to hip discomfort; Instructed patient in LLE SAQ x10 reps with increased difficulty reported Other Exercises: Patient required min VCS to increase ROM to tolerance to increase flexibility and strength;    General Comments        Pertinent Vitals/Pain Pain Assessment: 0-10 Pain Score: 8  Pain Location: left groin Pain Descriptors / Indicators: Aching;Sore Pain  Intervention(s): Limited activity within patient's tolerance;Monitored during session;Repositioned    Home Living                      Prior Function            PT Goals (current goals can now be found in the care plan section) Acute Rehab PT Goals Patient Stated Goal: to return home and walk more PT Goal Formulation: With patient Time For Goal Achievement: 07/01/20 Potential to Achieve Goals: Fair Progress towards PT goals: Progressing toward goals    Frequency    BID      PT Plan Current plan remains appropriate    Co-evaluation              AM-PAC PT "6 Clicks" Mobility   Outcome Measure  Help needed turning from your back to your side while in a flat bed without using bedrails?: A Little Help needed moving from lying on your back to sitting on the side of a flat bed without using bedrails?: A Little Help needed moving to and from a bed to a chair (including a wheelchair)?: A Little Help needed standing up from a chair using your arms (e.g., wheelchair or bedside chair)?: A Little Help needed to walk in hospital room?: A Lot Help needed climbing 3-5 steps with a railing? : A Lot 6 Click Score: 16    End of Session Equipment Utilized During Treatment: Gait belt Activity Tolerance: Patient limited by pain Patient left: in chair;with call bell/phone within reach;with chair alarm set Nurse Communication: Mobility status PT Visit Diagnosis: Difficulty in walking, not elsewhere classified (R26.2);History of falling (Z91.81);Pain Pain - Right/Left: Left Pain - part of body: Hip     Time: 1209-1242 PT Time Calculation (min) (ACUTE ONLY): 33 min  Charges:  $Gait Training: 8-22 mins $Therapeutic Exercise: 8-22 mins                        ,  PT, DPT 06/19/2020, 2:26 PM

## 2020-06-19 NOTE — Progress Notes (Signed)
PT Cancellation Note  Patient Details Name: [name] MRN: [id number] DOB: 14-Jun-1940   Cancelled Treatment:    Reason Eval/Treat Not Completed: Fatigue/lethargy limiting ability to participate   Went in at 10:15, patient resting and requested PT to come back later  Checked in on patient at 11:40 and she was eating her lunch tray.   Will re-attempt PT treatment this afternoon.   , PT, DPT 06/19/2020, 11:41 AM

## 2020-06-19 NOTE — Progress Notes (Signed)
Met with the patient at the bedside to discuss DC plan and needs She lives alone but has help at home and people checking on her, her son provides transportation, she can afford her meds She has a rw but needs a 3 in 1, I contacted [employer], one will be brought to the room prior to DC, she is set up with [facility] PT

## 2020-06-19 NOTE — TOC Progression Note (Signed)
Transition of Care [facility]) - Progression Note    Patient Details  Name: [name] MRN: [id number] Date of Birth: Jun 18, 1940  Transition of Care [facility]) CM/SW [place], RN Phone Number: 06/19/2020, 11:08 AM  Clinical Narrative:     Met with the patient and her son in the room, they agree for her to got to SNF for STR, PASSR obtained, Fl2 completed, bedsearch sent, she has had 2 Covid vaccines, Had Covid in July 2021 and had infusion, She agrees to get a booster prior to DC, she has been to [facility] in the past wnd would like to return there is possible  Expected Discharge Plan: [place] Barriers to Discharge: Barriers Resolved  Expected Discharge Plan and Services Expected Discharge Plan: [place]   Discharge Planning Services: CM Consult   Living arrangements for the past 2 months: Single Family Home                 DME Arranged: 3-N-1 DME Agency: [employer] Date DME Agency Contacted: 06/19/20 Time DME Agency Contacted: 1017 Representative spoke with at DME Agency: Suanne Marker [place]: PT [place]: [facility]) Date [place]: 06/19/20 Time [place]: 1018 Representative spoke with at [place]: [place]   Social Determinants of Health ([place]) Interventions    Readmission Risk Interventions No flowsheet data found.

## 2020-06-20 MED ORDER — COVID-19 MRNA VAC-TRIS(PFIZER) 30 MCG/0.3ML IM SUSP
0.3000 mL | Freq: Once | INTRAMUSCULAR | Status: AC
  Administered 2020-06-20: 0.3 mL via INTRAMUSCULAR
  Filled 2020-06-20: qty 0.3

## 2020-06-20 NOTE — TOC Progression Note (Signed)
Transition of Care [facility]) - Progression Note    Patient Details  Name: [name] MRN: [id number] Date of Birth: [birth year]  Transition of Care [facility]) CM/SW [place], RN Phone Number: 06/20/2020, 8:46 AM  Clinical Narrative:   Jeralene Huff out to Magda Paganini at [facility] where the patient has been before requesting to offer a bed for the patient for STR, awaiting a responce    Expected Discharge Plan: [place] Barriers to Discharge: Barriers Resolved  Expected Discharge Plan and Services Expected Discharge Plan: [place]   Discharge Planning Services: CM Consult   Living arrangements for the past 2 months: Single Family Home                 DME Arranged: 3-N-1 DME Agency: [employer] Date DME Agency Contacted: 06/19/20 Time DME Agency Contacted: 30 Representative spoke with at DME Agency: Suanne Marker [place]: PT [place]: [facility] Date [place]: 06/19/20 Time [place]: 1018 Representative spoke with at [place]: [place]   Social Determinants of Health ([place]) Interventions    Readmission Risk Interventions No flowsheet data found.

## 2020-06-20 NOTE — Progress Notes (Signed)
Physical Therapy Treatment Patient Details Name: [name] MRN: [id number] DOB: 15-Mar-1940 Today's Date: 06/20/2020    History of Present Illness [name] is 80yo female that presents for L total hip arthroplasty (anterior approach). She has PMHx significant for osteoarthritis of the L hip, atrial fibrillation, and R wrist pain. She is s/p L3-L5 decompression x 4 weeks with improved back pain. Mobility has been severly limited d/t L progressive groin pain with ability to only walk a few steps with RW.    PT Comments    Pt was long sitting in bed upon arriving. She agrees to PT session and was cooperative and pleasant throughout. Does endorse pain but did not rate. Required mod assist to exit bed. Sat EOB x several minutes prior to standing and ambulating to doorway of room and return. Very antalgic step to pattern. Limited by pain more so than  Fatigue. She was repositioned in recliner after gait training and was able to perform and tolerate there ex (total joint) exercise handout.    Follow Up Recommendations  SNF     Equipment Recommendations  Rolling walker with 5" wheels;3in1 (PT)       Precautions / Restrictions Precautions Precautions: Fall Restrictions Weight Bearing Restrictions: Yes LLE Weight Bearing: Weight bearing as tolerated    Mobility  Bed Mobility Overal bed mobility: Needs Assistance Bed Mobility: Supine to Sit     Supine to sit: Mod assist;HOB elevated     General bed mobility comments: pt required increased assistance 2/2 to pain to exit R side of bed. INcreased time and vcs for improved technique throughout.    Transfers Overall transfer level: Needs assistance Equipment used: Rolling walker (2 wheeled) Transfers: Sit to/from Stand Sit to Stand: Min assist;From elevated surface            Ambulation/Gait Ambulation/Gait assistance: Min assist;Min guard Gait Distance (Feet): 25 Feet Assistive device: Rolling walker (2 wheeled)   Gait  velocity: decreased   General Gait Details: pt was able to ambulate to doorway of room and back with slow steady gait kinematics required Min assist at first but after several steps was able to advance to CGA only       Balance Overall balance assessment: History of Falls;Needs assistance Sitting-balance support: Feet supported;Bilateral upper extremity supported Sitting balance-Leahy Scale: Good Sitting balance - Comments: no LOB in sititng   Standing balance support: During functional activity;Bilateral upper extremity supported Standing balance-Leahy Scale: Fair Standing balance comment: reliant on RW to decrease LE wt bearing. no LOB in static standing without UE support         Cognition Arousal/Alertness: Awake/alert Behavior During Therapy: WFL for tasks assessed/performed Overall Cognitive Status: Within Functional Limits for tasks assessed      General Comments: Pt is A and O x 4. Very pleasant and cooperative      Exercises Total Joint Exercises Ankle Circles/Pumps: AROM;10 reps Quad Sets: AROM;10 reps Gluteal Sets: AROM;10 reps Towel Squeeze: AROM;10 reps Heel Slides: AAROM;5 reps Straight Leg Raises: AAROM;5 reps     Pertinent Vitals/Pain Pain Assessment: 0-10 Pain Score: 6  Pain Location: left groin Pain Descriptors / Indicators: Aching;Sore Pain Intervention(s): Limited activity within patient's tolerance;Monitored during session;Premedicated before session;Repositioned           PT Goals (current goals can now be found in the care plan section) Acute Rehab PT Goals Patient Stated Goal: rehab then home Progress towards PT goals: Progressing toward goals    Frequency    BID  PT Plan Current plan remains appropriate       AM-PAC PT "6 Clicks" Mobility   Outcome Measure  Help needed turning from your back to your side while in a flat bed without using bedrails?: A Little Help needed moving from lying on your back to sitting on the  side of a flat bed without using bedrails?: A Lot Help needed moving to and from a bed to a chair (including a wheelchair)?: A Little Help needed standing up from a chair using your arms (e.g., wheelchair or bedside chair)?: A Little Help needed to walk in hospital room?: A Lot Help needed climbing 3-5 steps with a railing? : A Lot 6 Click Score: 15    End of Session Equipment Utilized During Treatment: Gait belt Activity Tolerance: Patient limited by pain Patient left: in chair;with call bell/phone within reach;with chair alarm set Nurse Communication: Mobility status PT Visit Diagnosis: Difficulty in walking, not elsewhere classified (R26.2);History of falling (Z91.81);Pain Pain - Right/Left: Left Pain - part of body: Hip     Time: 1406-1430 PT Time Calculation (min) (ACUTE ONLY): 24 min  Charges:  $Gait Training: 8-22 mins $Therapeutic Exercise: 8-22 mins                     Julaine Fusi PTA 06/20/20, 3:13 PM

## 2020-06-20 NOTE — TOC Progression Note (Signed)
Transition of Care [facility]) - Progression Note    Patient Details  Name: SUEKO DIMICHELE MRN: [id number] Date of Birth: [birth year]  Transition of Care [facility]) CM/SW [place], RN Phone Number: 06/20/2020, 9:32 AM  Clinical Narrative:   Spoke with the Hulan Fess and the patient and reviewed bed offers, she is agreeable to go to [address] with [facility] stated they will not have a bed open until Wednesday, The patient is agreeable to get the Covid Booster Prior to DC, Ins auth started and is pending Ref number [id number]   Expected Discharge Plan: [place] Barriers to Discharge: Barriers Resolved  Expected Discharge Plan and Services Expected Discharge Plan: [place]   Discharge Planning Services: CM Consult   Living arrangements for the past 2 months: Single Family Home                 DME Arranged: 3-N-1 DME Agency: [employer] Date DME Agency Contacted: 06/19/20 Time DME Agency Contacted: 1017 Representative spoke with at DME Agency: Suanne Marker [place]: PT [place]: [facility] Date [place]: 06/19/20 Time [place]: 1018 Representative spoke with at [place]: [place]   Social Determinants of Health ([place]) Interventions    Readmission Risk Interventions No flowsheet data found.

## 2020-06-20 NOTE — Care Management Important Message (Signed)
Important Message  Patient Details  Name: [name] MRN: [id number] Date of Birth: 1940/06/01   Medicare Important Message Given:  Yes     Juliann Pulse A  06/20/2020, 11:16 AM

## 2020-06-20 NOTE — Progress Notes (Signed)
PT Cancellation Note  Patient Details Name: [name] MRN: [id number] DOB: 1940-04-09   Cancelled Treatment:     PT attempt. 2 x this morning however pt unwilling. On first attempt pt requesting pain meds. 2nd attempt pt was asleep but requested therapist return later this date. Will follow up after lunch.    Willette Pa 06/20/2020, 12:09 PM

## 2020-06-20 NOTE — Progress Notes (Signed)
   Subjective: 4 Days Post-Op Procedure(s) (LRB): TOTAL HIP ARTHROPLASTY ANTERIOR APPROACH (Left) Patient reports pain as mild.   Patient is well, and has had no acute complaints or problems Denies any CP, SOB, ABD pain. We will continue therapy today.  Plan is to go Skilled nursing facility after hospital stay.  Objective: Vital signs in last 24 hours: Temp:  [98 F (36.7 C)-99.1 F (37.3 C)] 98.7 F (37.1 C) (05/31 0806) Pulse Rate:  [65-78] 69 (05/31 0806) Resp:  [16-18] 16 (05/31 0806) BP: (121-153)/(44-59) 151/59 (05/31 0806) SpO2:  [93 %-98 %] 98 % (05/31 0806)  Intake/Output from previous day: 05/30 0701 - 05/31 0700 In: 360 [P.O.:360] Out: -  Intake/Output this shift: No intake/output data recorded.  Recent Labs    06/18/20 0517 06/19/20 0554  HGB 8.3* 8.3*   Recent Labs    06/18/20 0517 06/19/20 0554  WBC 12.1* 12.5*  RBC 2.65* 2.68*  HCT 25.7* 25.5*  PLT 291 291   No results for input(s): NA, K, CL, CO2, BUN, CREATININE, GLUCOSE, CALCIUM in the last 72 hours. No results for input(s): LABPT, INR in the last 72 hours.  EXAM General - Patient is Alert, Appropriate and Oriented Extremity - Neurovascular intact Sensation intact distally Intact pulses distally Dorsiflexion/Plantar flexion intact No cellulitis present Compartment soft Dressing - dressing C/D/I and no drainage Motor Function - intact, moving foot and toes well on exam.   Past Medical History:  Diagnosis Date  . Anxiety   . Aortic atherosclerosis ([place])   . Current use of long term anticoagulation    APIXABAN  . Dysrhythmia    A-fib  . GERD (gastroesophageal reflux disease)   . History of 2019 novel coronavirus disease (COVID-19) 07/2019  . Hypercholesteremia   . Hypertension   . IDA (iron deficiency anemia)   . Paroxysmal atrial fibrillation (HCC)   . Skin cancer    Skin - squamous    Assessment/Plan:   4 Days Post-Op Procedure(s) (LRB): TOTAL HIP ARTHROPLASTY ANTERIOR  APPROACH (Left) Active Problems:   S/P hip replacement  Estimated body mass index is 32.3 kg/m as calculated from the following:   Height as of this encounter: 5\' 2"  (1.575 m).   Weight as of this encounter: 80.1 kg. Advance diet Up with therapy  Pain controlled Labs and VSS  CM to assist with [facility]  DVT Prophylaxis - TED hose and SCDs Eliquis Weight-Bearing as tolerated to left leg   T. Rachelle Hora, PA-C [place] 06/20/2020, 9:44 AM

## 2020-06-21 LAB — SURGICAL PATHOLOGY

## 2020-06-21 MED ORDER — HYDROCODONE-ACETAMINOPHEN 5-325 MG PO TABS: 1.0000 | ORAL_TABLET | ORAL | 0 refills | Status: DC | PRN

## 2020-06-21 NOTE — Discharge Instructions (Signed)

## 2020-06-21 NOTE — TOC Progression Note (Signed)
Transition of Care [facility]) - Progression Note    Patient Details  Name: [name] MRN: [id number] Date of Birth: [birth year]  Transition of Care [facility]) CM/SW [place], RN Phone Number: 06/21/2020, 10:13 AM  Clinical Narrative:   The patient is to go to room 502 at [facility] today, She stated that her son will transport her.  I sent the [facility] to [facility] showing the booster vaccine from yesterday and the DC summary thru the Hub, Notified the Nurse to call report to [phone]   Expected Discharge Plan: [place] Barriers to Discharge: Barriers Resolved  Expected Discharge Plan and Services Expected Discharge Plan: [place]   Discharge Planning Services: CM Consult   Living arrangements for the past 2 months: Single Family Home Expected Discharge Date: 06/21/20               DME Arranged: 3-N-1 DME Agency: [employer] Date DME Agency Contacted: 06/19/20 Time DME Agency Contacted: 51 Representative spoke with at DME Agency: Suanne Marker [place]: PT [place]: [facility] Date [place]: 06/19/20 Time [place]: 1018 Representative spoke with at [place]: [place]   Social Determinants of Health ([place]) Interventions    Readmission Risk Interventions No flowsheet data found.

## 2020-06-21 NOTE — Discharge Summary (Signed)
Physician Discharge Summary  Patient ID: [name] MRN: [id number] DOB/AGE: [birth year] 80 y.o.  Admit date: 06/16/2020 Discharge date: 06/21/2020  Admission Diagnoses:  S/P hip replacement [Z96.649]   Discharge Diagnoses: Patient Active Problem List   Diagnosis Date Noted  . S/P hip replacement 06/16/2020    Past Medical History:  Diagnosis Date  . Anxiety   . Aortic atherosclerosis ([place])   . Current use of long term anticoagulation    APIXABAN  . Dysrhythmia    A-fib  . GERD (gastroesophageal reflux disease)   . History of 2019 novel coronavirus disease (COVID-19) 07/2019  . Hypercholesteremia   . Hypertension   . IDA (iron deficiency anemia)   . Paroxysmal atrial fibrillation (HCC)   . Skin cancer    Skin - squamous     Transfusion: NOne   Consultants (if any):   Discharged Condition: Improved  Hospital Course: [name] is an 80 y.o. female who was admitted 06/16/2020 with a diagnosis of left hip osteoathritis and went to the operating room on 06/16/2020 and underwent the above named procedures.    Surgeries: Procedure(s): TOTAL HIP ARTHROPLASTY ANTERIOR APPROACH on 06/16/2020 Patient tolerated the surgery well. Taken to PACU where she was stabilized and then transferred to the orthopedic floor.  Started on Lovenox Eliquis, Foot pumps applied bilaterally at 80 mm. Heels elevated on bed with rolled towels. No evidence of DVT. Negative [place]. Physical therapy started on day #1 for gait training and transfer. OT started day #1 for ADL and assisted devices.  Patient's foley was d/c on day #1. Patient's IV and hemovac was d/c on day #2. Patient with slow progress with PT. PT recommending SNF   On post op day #4 patient was stable and ready for discharge to SNF.  Implants: Medacta Masterloc 4 std, 50 mm Mpact DM cup and linger, L 28 mm head  She was given perioperative antibiotics:  Anti-infectives (From admission, onward)   Start     Dose/Rate Route  Frequency Ordered Stop   06/16/20 2000  ceFAZolin (ANCEF) IVPB 2g/100 mL premix        2 g 200 mL/hr over 30 Minutes Intravenous Every 6 hours 06/16/20 1524 06/17/20 0255   06/16/20 1130  ceFAZolin (ANCEF) IVPB 2g/100 mL premix        2 g 200 mL/hr over 30 Minutes Intravenous On call to O.R. 06/16/20 1124 06/16/20 1338   06/16/20 1125  ceFAZolin (ANCEF) 2-4 GM/100ML-% IVPB       Note to Pharmacy: Sylvester Harder   : cabinet override      06/16/20 1125 06/16/20 2140    .  She was given sequential compression devices, early ambulation, and ELiquis for DVT prophylaxis.  She benefited maximally from the hospital stay and there were no complications.    Recent vital signs:  Vitals:   06/20/20 2357 06/21/20 0347  BP: (!) 142/57 (!) 144/45  Pulse: 65 65  Resp: 18 16  Temp: 97.9 F (36.6 C) 97.8 F (36.6 C)  SpO2: 96% 94%    Recent laboratory studies:  Lab Results  Component Value Date   HGB 8.3 (L) 06/19/2020   HGB 8.3 (L) 06/18/2020   HGB 9.7 (L) 06/17/2020   Lab Results  Component Value Date   WBC 12.5 (H) 06/19/2020   PLT 291 06/19/2020   Lab Results  Component Value Date   INR 1.7 (H) 05/03/2020   Lab Results  Component Value Date   NA 138 06/17/2020  K 4.4 06/17/2020   CL 108 06/17/2020   CO2 23 06/17/2020   BUN 17 06/17/2020   CREATININE 1.07 (H) 06/17/2020   GLUCOSE 154 (H) 06/17/2020    Discharge Medications:   Allergies as of 06/21/2020      Reactions   Aspirin    Causes bleeding in stomach   Nsaids Other (See Comments)   Bleeding in stomach   Methocarbamol Other (See Comments)   Hallucinations   Morphine And Related Other (See Comments)   Disoriented hallucinations   Oxycodone Other (See Comments)   Disoriented Hallucinations      Medication List    TAKE these medications   amLODipine 5 MG tablet Commonly known as: NORVASC Take 5 mg by mouth in the morning.   carboxymethylcellulose 0.5 % Soln Commonly known as: REFRESH PLUS Place 1 drop  into both eyes 3 (three) times daily as needed (dry eyes).   cholecalciferol 1000 units tablet Commonly known as: VITAMIN D Take 2,000 Units by mouth in the morning.   dronedarone 400 MG tablet Commonly known as: MULTAQ Take 400 mg by mouth 2 (two) times daily with a meal.   Eliquis 5 MG Tabs tablet Generic drug: apixaban Take 1 tablet by mouth 2 times daily. Call clinic - overdue for appt with Dr Rayann Heman. What changed:   how much to take  how to take this  when to take this  additional instructions   escitalopram 10 MG tablet Commonly known as: LEXAPRO Take 10 mg by mouth at bedtime.   estradiol 0.5 MG tablet Commonly known as: ESTRACE Take 0.5 mg by mouth every morning.   gabapentin 300 MG capsule Commonly known as: NEURONTIN Take 300 mg by mouth 3 (three) times daily.   HYDROcodone-acetaminophen 5-325 MG tablet Commonly known as: NORCO/VICODIN Take 1-2 tablets by mouth every 4 (four) hours as needed for moderate pain (pain score 4-6). What changed:   how much to take  when to take this  reasons to take this  additional instructions   Lidocaine HCl 4 % Crea Apply 1 application topically daily as needed (hip pain).   meclizine 25 MG tablet Commonly known as: ANTIVERT Take 25 mg by mouth at bedtime.   methocarbamol 500 MG tablet Commonly known as: ROBAXIN Take 500 mg by mouth 4 (four) times daily as needed for muscle spasms.   multivitamin tablet Take 1 tablet by mouth every morning.   omeprazole 20 MG capsule Commonly known as: PRILOSEC Take 20 mg by mouth 2 (two) times daily.   pravastatin 40 MG tablet Commonly known as: PRAVACHOL Take 40 mg by mouth at bedtime.            Durable Medical Equipment  (From admission, onward)         Start     Ordered   06/16/20 1525  DME Walker rolling  Once       Question Answer Comment  Walker: With 5 Inch Wheels   Patient needs a walker to treat with the following condition S/P hip replacement       06/16/20 1524   06/16/20 1525  DME 3 n 1  Once        06/16/20 1524   06/16/20 1525  DME Bedside commode  Once       Question:  Patient needs a bedside commode to treat with the following condition  Answer:  S/P hip replacement   06/16/20 1524          Diagnostic Studies:  DG HIP OPERATIVE UNILAT W OR W/O PELVIS LEFT  Result Date: 06/16/2020 CLINICAL DATA:  Left hip replacement. EXAM: OPERATIVE LEFT HIP (WITH PELVIS IF PERFORMED) TECHNIQUE: Fluoroscopic spot image(s) were submitted for interpretation post-operatively. COMPARISON:  None. FINDINGS: Single frontal fluoroscopic spot view of the left hip obtained in the operating room after left hip arthroplasty. Fluoroscopy time and radiation dose not provided. IMPRESSION: Single fluoroscopic spot view during left hip arthroplasty. Electronically Signed   By: Keith Rake M.D.   On: 06/16/2020 15:16   DG HIP UNILAT W OR W/O PELVIS 2-3 VIEWS LEFT  Result Date: 06/16/2020 CLINICAL DATA:  Postop hip replacement. EXAM: DG HIP (WITH OR WITHOUT PELVIS) 2-3V LEFT COMPARISON:  None. FINDINGS: Left hip arthroplasty in expected alignment. There are no periprosthetic lucencies or fracture. Recent postsurgical change includes air and edema in the joint space and soft tissues. Skin staples and wound VAC in place. IMPRESSION: Left hip arthroplasty without immediate postoperative complication. Electronically Signed   By: Keith Rake M.D.   On: 06/16/2020 15:32    Disposition:      Contact information for follow-up providers    Duanne Guess, PA-C Follow up in 2 week(s).   Specialties: Orthopedic Surgery, Emergency Medicine Contact information: [place] [address] [phone]            Contact information for after-discharge care    [facility] .   Service: Skilled Nursing Contact information: [address] [place] [phone]                   Signed: [facility] 06/21/2020, 8:09 AM

## 2020-06-21 NOTE — Progress Notes (Signed)
Pt d/c to [facility] via [place] with son.  D/c packet printed and scripts given to son to give to [facility] upon arrival.  NAD noted VSS no IV present at time of d/c.  All belongings taken.

## 2020-06-21 NOTE — Progress Notes (Signed)
   Subjective: 5 Days Post-Op Procedure(s) (LRB): TOTAL HIP ARTHROPLASTY ANTERIOR APPROACH (Left) Patient reports pain as mild.   Patient is well, and has had no acute complaints or problems Denies any CP, SOB, ABD pain. We will continue therapy today.  Plan is to go Skilled nursing facility after hospital stay.  Objective: Vital signs in last 24 hours: Temp:  [97.8 F (36.6 C)-99.1 F (37.3 C)] 97.8 F (36.6 C) (06/01 0347) Pulse Rate:  [65-72] 65 (06/01 0347) Resp:  [16-20] 16 (06/01 0347) BP: (121-159)/(43-57) 144/45 (06/01 0347) SpO2:  [93 %-96 %] 94 % (06/01 0347)  Intake/Output from previous day: 05/31 0701 - 06/01 0700 In: 240 [P.O.:240] Out: -  Intake/Output this shift: No intake/output data recorded.  Recent Labs    06/19/20 0554  HGB 8.3*   Recent Labs    06/19/20 0554  WBC 12.5*  RBC 2.68*  HCT 25.5*  PLT 291   No results for input(s): NA, K, CL, CO2, BUN, CREATININE, GLUCOSE, CALCIUM in the last 72 hours. No results for input(s): LABPT, INR in the last 72 hours.  EXAM General - Patient is Alert, Appropriate and Oriented Extremity - Neurovascular intact Sensation intact distally Intact pulses distally Dorsiflexion/Plantar flexion intact No cellulitis present Compartment soft Dressing - dressing C/D/I and no drainage Motor Function - intact, moving foot and toes well on exam.   Past Medical History:  Diagnosis Date  . Anxiety   . Aortic atherosclerosis ([place])   . Current use of long term anticoagulation    APIXABAN  . Dysrhythmia    A-fib  . GERD (gastroesophageal reflux disease)   . History of 2019 novel coronavirus disease (COVID-19) 07/2019  . Hypercholesteremia   . Hypertension   . IDA (iron deficiency anemia)   . Paroxysmal atrial fibrillation (HCC)   . Skin cancer    Skin - squamous    Assessment/Plan:   5 Days Post-Op Procedure(s) (LRB): TOTAL HIP ARTHROPLASTY ANTERIOR APPROACH (Left) Active Problems:   S/P hip  replacement  Estimated body mass index is 32.3 kg/m as calculated from the following:   Height as of this encounter: 5\' 2"  (1.575 m).   Weight as of this encounter: 80.1 kg. Advance diet Up with therapy  Pain controlled Labs and VSS  CM to assist [facility]  DVT Prophylaxis - TED hose and SCDs Eliquis Weight-Bearing as tolerated to left leg   T. Rachelle Hora, PA-C [place] 06/21/2020, 8:11 AM

## 2020-09-12 ENCOUNTER — Other Ambulatory Visit: Payer: Self-pay | Admitting: Physician Assistant

## 2020-09-12 DIAGNOSIS — Z1231 Encounter for screening mammogram for malignant neoplasm of breast: Secondary | ICD-10-CM

## 2020-10-09 ENCOUNTER — Other Ambulatory Visit: Payer: Self-pay | Admitting: Orthopedic Surgery

## 2020-10-09 ENCOUNTER — Ambulatory Visit
Admission: RE | Admit: 2020-10-09 | Discharge: 2020-10-09 | Disposition: A | Discharge status: Home / Self Care | Payer: Medicare Other | Source: Ambulatory Visit | Attending: Physician Assistant | Admitting: Physician Assistant

## 2020-10-09 ENCOUNTER — Other Ambulatory Visit: Payer: Self-pay

## 2020-10-09 DIAGNOSIS — M25551 Pain in right hip: Secondary | ICD-10-CM

## 2020-10-09 DIAGNOSIS — M1611 Unilateral primary osteoarthritis, right hip: Secondary | ICD-10-CM

## 2020-10-09 DIAGNOSIS — Z1231 Encounter for screening mammogram for malignant neoplasm of breast: Secondary | ICD-10-CM | POA: Insufficient documentation

## 2020-10-26 ENCOUNTER — Other Ambulatory Visit (HOSPITAL_BASED_OUTPATIENT_CLINIC_OR_DEPARTMENT_OTHER): Payer: Self-pay | Admitting: Orthopedic Surgery

## 2020-10-26 ENCOUNTER — Other Ambulatory Visit (HOSPITAL_COMMUNITY): Payer: Self-pay | Admitting: Orthopedic Surgery

## 2020-10-26 DIAGNOSIS — M1611 Unilateral primary osteoarthritis, right hip: Secondary | ICD-10-CM

## 2020-10-26 DIAGNOSIS — T8484XA Pain due to internal orthopedic prosthetic devices, implants and grafts, initial encounter: Secondary | ICD-10-CM

## 2020-11-07 ENCOUNTER — Encounter
Admission: RE | Admit: 2020-11-07 | Discharge: 2020-11-07 | Disposition: A | Discharge status: Home / Self Care | Payer: Medicare Other | Source: Ambulatory Visit | Attending: Orthopedic Surgery | Admitting: Orthopedic Surgery

## 2020-11-07 ENCOUNTER — Other Ambulatory Visit: Payer: Self-pay

## 2020-11-07 DIAGNOSIS — M1611 Unilateral primary osteoarthritis, right hip: Secondary | ICD-10-CM | POA: Insufficient documentation

## 2020-11-07 DIAGNOSIS — Z96651 Presence of right artificial knee joint: Secondary | ICD-10-CM | POA: Diagnosis present

## 2020-11-07 DIAGNOSIS — T8484XA Pain due to internal orthopedic prosthetic devices, implants and grafts, initial encounter: Secondary | ICD-10-CM | POA: Insufficient documentation

## 2020-11-07 MED ORDER — TECHNETIUM TC 99M MEDRONATE IV KIT
20.0000 | PACK | Freq: Once | INTRAVENOUS | Status: AC | PRN
  Administered 2020-11-07: 21.22 via INTRAVENOUS

## 2021-01-24 ENCOUNTER — Other Ambulatory Visit: Payer: Self-pay | Admitting: Orthopedic Surgery

## 2021-02-01 ENCOUNTER — Inpatient Hospital Stay: Admission: RE | Admit: 2021-02-01 | Payer: Medicare Other | Source: Ambulatory Visit

## 2021-02-02 ENCOUNTER — Encounter
Admission: RE | Admit: 2021-02-02 | Discharge: 2021-02-02 | Disposition: A | Discharge status: Home / Self Care | Payer: Medicare Other | Source: Ambulatory Visit | Attending: Orthopedic Surgery | Admitting: Orthopedic Surgery

## 2021-02-02 ENCOUNTER — Other Ambulatory Visit: Payer: Self-pay

## 2021-02-02 VITALS — BP 129/52 | HR 57 | Resp 16

## 2021-02-02 DIAGNOSIS — Z01810 Encounter for preprocedural cardiovascular examination: Secondary | ICD-10-CM | POA: Diagnosis not present

## 2021-02-02 DIAGNOSIS — Z01812 Encounter for preprocedural laboratory examination: Secondary | ICD-10-CM

## 2021-02-02 DIAGNOSIS — Z01818 Encounter for other preprocedural examination: Secondary | ICD-10-CM | POA: Diagnosis present

## 2021-02-02 HISTORY — DX: Personal history of urinary calculi: Z87.442

## 2021-02-02 HISTORY — DX: Unspecified osteoarthritis, unspecified site: M19.90

## 2021-02-02 LAB — URINALYSIS, ROUTINE W REFLEX MICROSCOPIC
Bilirubin Urine: NEGATIVE
Glucose, UA: NEGATIVE mg/dL
Ketones, ur: NEGATIVE mg/dL
Nitrite: POSITIVE — AB
Protein, ur: NEGATIVE mg/dL
Specific Gravity, Urine: 1.011 (ref 1.005–1.030)
pH: 5 (ref 5.0–8.0)

## 2021-02-02 LAB — TYPE AND SCREEN
ABO/RH(D): A POS
Antibody Screen: NEGATIVE

## 2021-02-02 LAB — CBC WITH DIFFERENTIAL/PLATELET
Abs Immature Granulocytes: 0.01 10*3/uL (ref 0.00–0.07)
Basophils Absolute: 0.1 10*3/uL (ref 0.0–0.1)
Basophils Relative: 1 %
Eosinophils Absolute: 0.4 10*3/uL (ref 0.0–0.5)
Eosinophils Relative: 5 %
HCT: 35.1 % — ABNORMAL LOW (ref 36.0–46.0)
Hemoglobin: 11.3 g/dL — ABNORMAL LOW (ref 12.0–15.0)
Immature Granulocytes: 0 %
Lymphocytes Relative: 37 %
Lymphs Abs: 3.1 10*3/uL (ref 0.7–4.0)
MCH: 31.3 pg (ref 26.0–34.0)
MCHC: 32.2 g/dL (ref 30.0–36.0)
MCV: 97.2 fL (ref 80.0–100.0)
Monocytes Absolute: 0.9 10*3/uL (ref 0.1–1.0)
Monocytes Relative: 11 %
Neutro Abs: 3.8 10*3/uL (ref 1.7–7.7)
Neutrophils Relative %: 46 %
Platelets: 273 10*3/uL (ref 150–400)
RBC: 3.61 MIL/uL — ABNORMAL LOW (ref 3.87–5.11)
RDW: 13.2 % (ref 11.5–15.5)
WBC: 8.2 10*3/uL (ref 4.0–10.5)
nRBC: 0 % (ref 0.0–0.2)

## 2021-02-02 LAB — COMPREHENSIVE METABOLIC PANEL
ALT: 13 U/L (ref 0–44)
AST: 15 U/L (ref 15–41)
Albumin: 3.9 g/dL (ref 3.5–5.0)
Alkaline Phosphatase: 47 U/L (ref 38–126)
Anion gap: 9 (ref 5–15)
BUN: 21 mg/dL (ref 8–23)
CO2: 24 mmol/L (ref 22–32)
Calcium: 9 mg/dL (ref 8.9–10.3)
Chloride: 105 mmol/L (ref 98–111)
Creatinine, Ser: 1.11 mg/dL — ABNORMAL HIGH (ref 0.44–1.00)
GFR, Estimated: 50 mL/min — ABNORMAL LOW (ref >60)
Glucose, Bld: 89 mg/dL (ref 70–99)
Potassium: 3.9 mmol/L (ref 3.5–5.1)
Sodium: 138 mmol/L (ref 135–145)
Total Bilirubin: 0.6 mg/dL (ref 0.3–1.2)
Total Protein: 7.2 g/dL (ref 6.5–8.1)

## 2021-02-02 LAB — SURGICAL PCR SCREEN
MRSA, PCR: NEGATIVE
Staphylococcus aureus: NEGATIVE

## 2021-02-02 NOTE — Patient Instructions (Addendum)
Your procedure is scheduled on: 02/15/21 - Thursday Report to the Registration Desk on the 1st floor of the [facility]. To find out your arrival time, please call [phone] between 1PM - 3PM on: 02/14/21 - Wednesday Report to [place] for Covid Test on 02/13/21 between 8 am - 12 noon.  REMEMBER: Instructions that are not followed completely may result in serious medical risk, up to and including death; or upon the discretion of your surgeon and anesthesiologist your surgery may need to be rescheduled.  Do not eat food after midnight the night before surgery.  No gum chewing, lozengers or hard candies.  You may however, drink CLEAR liquids up to 2 hours before you are scheduled to arrive for your surgery. Do not drink anything within 2 hours of your scheduled arrival time.  Clear liquids include: - water  - apple juice without pulp - gatorade (not RED, PURPLE, OR BLUE) - black coffee or tea (Do NOT add milk or creamers to the coffee or tea) Do NOT drink anything that is not on this list.  In addition, your doctor has ordered for you to drink the provided  Ensure Pre-Surgery Clear Carbohydrate Drink  Drinking this carbohydrate drink up to two hours before surgery helps to reduce insulin resistance and improve patient outcomes. Please complete drinking 2 hours prior to scheduled arrival time.  TAKE THESE MEDICATIONS THE MORNING OF SURGERY WITH A SIP OF WATER:  - amLODipine (NORVASC) 5 MG tablet - estradiol (ESTRACE) 0.5 MG tablet - gabapentin (NEURONTIN) 300 MG capsule - omeprazole (PRILOSEC) 20 MG capsule - dronedarone (MULTAQ) 400 MG tablet   Follow recommendations from Cardiologist, Pulmonologist or PCP regarding stopping Aspirin, Coumadin, Plavix, Eliquis, Pradaxa, or Pletal.  One week prior to surgery: Stop Anti-inflammatories (NSAIDS) such as Advil, Aleve, Ibuprofen, Motrin, Naproxen, Naprosyn and Aspirin based products such as Excedrin, Goodys Powder, BC  Powder.  Stop ANY OVER THE COUNTER supplements until after surgery.  You may however, continue to take Tylenol if needed for pain up until the day of surgery.  No Alcohol for 24 hours before or after surgery.  No Smoking including e-cigarettes for 24 hours prior to surgery.  No chewable tobacco products for at least 6 hours prior to surgery.  No nicotine patches on the day of surgery.  Do not use any "recreational" drugs for at least a week prior to your surgery.  Please be advised that the combination of cocaine and anesthesia may have negative outcomes, up to and including death. If you test positive for cocaine, your surgery will be cancelled.  On the morning of surgery brush your teeth with toothpaste and water, you may rinse your mouth with mouthwash if you wish. Do not swallow any toothpaste or mouthwash.  Use CHG Soap or wipes as directed on instruction sheet.  Do not wear jewelry, make-up, hairpins, clips or nail polish.  Do not wear lotions, powders, or perfumes.   Do not shave body from the neck down 48 hours prior to surgery just in case you cut yourself which could leave a site for infection.  Also, freshly shaved skin may become irritated if using the CHG soap.  Contact lenses, hearing aids and dentures may not be worn into surgery.  Do not bring valuables to the hospital. [facility] is not responsible for any missing/lost belongings or valuables.   Notify your doctor if there is any change in your medical condition (cold, fever, infection).  Wear comfortable clothing (specific to  your surgery type) to the hospital.  After surgery, you can help prevent lung complications by doing breathing exercises.  Take deep breaths and cough every 1-2 hours. Your doctor may order a device called an Incentive Spirometer to help you take deep breaths. When coughing or sneezing, hold a pillow firmly against your incision with both hands. This is called splinting. Doing this  helps protect your incision. It also decreases belly discomfort.  If you are being admitted to the hospital overnight, leave your suitcase in the car. After surgery it may be brought to your room.  If you are being discharged the day of surgery, you will not be allowed to drive home. You will need a responsible adult (18 years or older) to drive you home and stay with you that night.   If you are taking public transportation, you will need to have a responsible adult (18 years or older) with you. Please confirm with your physician that it is acceptable to use public transportation.   Please call the [facility] at [phone] if you have any questions about these instructions.  Surgery Visitation Policy:  Patients undergoing a surgery or procedure may have one family member or support person with them as long as that person is not COVID-19 positive or experiencing its symptoms.  That person may remain in the waiting area during the procedure and may rotate out with other people.  Inpatient Visitation:    Visiting hours are 7 a.m. to 8 p.m. Up to two visitors ages 16+ are allowed at one time in a patient room. The visitors may rotate out with other people during the day. Visitors must check out when they leave, or other visitors will not be allowed. One designated support person may remain overnight. The visitor must pass COVID-19 screenings, use hand sanitizer when entering and exiting the patients room and wear a mask at all times, including in the patients room. Patients must also wear a mask when staff or their visitor are in the room. Masking is required regardless of vaccination status.

## 2021-02-13 ENCOUNTER — Other Ambulatory Visit
Admission: RE | Admit: 2021-02-13 | Discharge: 2021-02-13 | Disposition: A | Discharge status: Home / Self Care | Payer: Medicare Other | Source: Ambulatory Visit | Attending: Orthopedic Surgery | Admitting: Orthopedic Surgery

## 2021-02-13 ENCOUNTER — Other Ambulatory Visit: Payer: Self-pay

## 2021-02-13 DIAGNOSIS — Z01812 Encounter for preprocedural laboratory examination: Secondary | ICD-10-CM | POA: Insufficient documentation

## 2021-02-13 DIAGNOSIS — Z20822 Contact with and (suspected) exposure to covid-19: Secondary | ICD-10-CM

## 2021-02-13 LAB — SARS CORONAVIRUS 2 (TAT 6-24 HRS): SARS Coronavirus 2: NEGATIVE

## 2021-02-15 ENCOUNTER — Encounter: Payer: Self-pay | Admitting: Orthopedic Surgery

## 2021-02-15 ENCOUNTER — Inpatient Hospital Stay: Payer: Medicare Other

## 2021-02-15 ENCOUNTER — Other Ambulatory Visit: Payer: Self-pay

## 2021-02-15 ENCOUNTER — Inpatient Hospital Stay: Payer: Medicare Other | Admitting: Certified Registered Nurse Anesthetist

## 2021-02-15 ENCOUNTER — Encounter
Admission: RE | Disposition: A | Discharge status: Skilled Nursing Facility | Payer: Self-pay | Source: Home / Self Care | Attending: Orthopedic Surgery

## 2021-02-15 ENCOUNTER — Inpatient Hospital Stay
Admission: RE | Admit: 2021-02-15 | Discharge: 2021-02-22 | DRG: 467 | Disposition: A | Discharge status: Skilled Nursing Facility | Payer: Medicare Other | Attending: Orthopedic Surgery | Admitting: Orthopedic Surgery

## 2021-02-15 DIAGNOSIS — Z01812 Encounter for preprocedural laboratory examination: Secondary | ICD-10-CM | POA: Diagnosis not present

## 2021-02-15 DIAGNOSIS — Z20822 Contact with and (suspected) exposure to covid-19: Secondary | ICD-10-CM | POA: Diagnosis present

## 2021-02-15 DIAGNOSIS — Z90710 Acquired absence of both cervix and uterus: Secondary | ICD-10-CM | POA: Diagnosis not present

## 2021-02-15 DIAGNOSIS — Z85828 Personal history of other malignant neoplasm of skin: Secondary | ICD-10-CM

## 2021-02-15 DIAGNOSIS — E876 Hypokalemia: Secondary | ICD-10-CM | POA: Diagnosis not present

## 2021-02-15 DIAGNOSIS — R4182 Altered mental status, unspecified: Secondary | ICD-10-CM | POA: Diagnosis not present

## 2021-02-15 DIAGNOSIS — Z96642 Presence of left artificial hip joint: Secondary | ICD-10-CM | POA: Diagnosis present

## 2021-02-15 DIAGNOSIS — Z87442 Personal history of urinary calculi: Secondary | ICD-10-CM

## 2021-02-15 DIAGNOSIS — F419 Anxiety disorder, unspecified: Secondary | ICD-10-CM | POA: Diagnosis present

## 2021-02-15 DIAGNOSIS — D62 Acute posthemorrhagic anemia: Secondary | ICD-10-CM | POA: Diagnosis not present

## 2021-02-15 DIAGNOSIS — Y831 Surgical operation with implant of artificial internal device as the cause of abnormal reaction of the patient, or of later complication, without mention of misadventure at the time of the procedure: Secondary | ICD-10-CM | POA: Diagnosis present

## 2021-02-15 DIAGNOSIS — Z888 Allergy status to other drugs, medicaments and biological substances status: Secondary | ICD-10-CM

## 2021-02-15 DIAGNOSIS — Z7901 Long term (current) use of anticoagulants: Secondary | ICD-10-CM | POA: Diagnosis not present

## 2021-02-15 DIAGNOSIS — T8484XA Pain due to internal orthopedic prosthetic devices, implants and grafts, initial encounter: Secondary | ICD-10-CM | POA: Diagnosis present

## 2021-02-15 DIAGNOSIS — K219 Gastro-esophageal reflux disease without esophagitis: Secondary | ICD-10-CM | POA: Diagnosis present

## 2021-02-15 DIAGNOSIS — Z961 Presence of intraocular lens: Secondary | ICD-10-CM | POA: Diagnosis present

## 2021-02-15 DIAGNOSIS — Z886 Allergy status to analgesic agent status: Secondary | ICD-10-CM | POA: Diagnosis not present

## 2021-02-15 DIAGNOSIS — R410 Disorientation, unspecified: Secondary | ICD-10-CM | POA: Diagnosis not present

## 2021-02-15 DIAGNOSIS — E7800 Pure hypercholesterolemia, unspecified: Secondary | ICD-10-CM | POA: Diagnosis present

## 2021-02-15 DIAGNOSIS — I480 Paroxysmal atrial fibrillation: Secondary | ICD-10-CM | POA: Diagnosis present

## 2021-02-15 DIAGNOSIS — Z9842 Cataract extraction status, left eye: Secondary | ICD-10-CM | POA: Diagnosis not present

## 2021-02-15 DIAGNOSIS — T84032A Mechanical loosening of internal right knee prosthetic joint, initial encounter: Principal | ICD-10-CM | POA: Diagnosis present

## 2021-02-15 DIAGNOSIS — Z96651 Presence of right artificial knee joint: Secondary | ICD-10-CM

## 2021-02-15 DIAGNOSIS — G9340 Encephalopathy, unspecified: Secondary | ICD-10-CM | POA: Diagnosis not present

## 2021-02-15 DIAGNOSIS — Z885 Allergy status to narcotic agent status: Secondary | ICD-10-CM

## 2021-02-15 DIAGNOSIS — Z79899 Other long term (current) drug therapy: Secondary | ICD-10-CM | POA: Diagnosis not present

## 2021-02-15 DIAGNOSIS — Z9841 Cataract extraction status, right eye: Secondary | ICD-10-CM | POA: Diagnosis not present

## 2021-02-15 DIAGNOSIS — I10 Essential (primary) hypertension: Secondary | ICD-10-CM | POA: Diagnosis present

## 2021-02-15 DIAGNOSIS — Z8616 Personal history of COVID-19: Secondary | ICD-10-CM

## 2021-02-15 DIAGNOSIS — R509 Fever, unspecified: Secondary | ICD-10-CM | POA: Diagnosis present

## 2021-02-15 DIAGNOSIS — Z8249 Family history of ischemic heart disease and other diseases of the circulatory system: Secondary | ICD-10-CM | POA: Diagnosis not present

## 2021-02-15 DIAGNOSIS — G8918 Other acute postprocedural pain: Secondary | ICD-10-CM

## 2021-02-15 HISTORY — PX: TOTAL KNEE REVISION: SHX996

## 2021-02-15 SURGERY — TOTAL KNEE REVISION
Anesthesia: Spinal | Site: Knee | Laterality: Right

## 2021-02-15 MED ORDER — GLYCOPYRROLATE 0.2 MG/ML IJ SOLN
INTRAMUSCULAR | Status: DC | PRN
Start: 2021-02-15 — End: 2021-02-15
  Administered 2021-02-15: .2 mg via INTRAVENOUS

## 2021-02-15 MED ORDER — HYDROCODONE-ACETAMINOPHEN 7.5-325 MG PO TABS
ORAL_TABLET | ORAL | Status: AC
  Administered 2021-02-15: 1 via ORAL
  Filled 2021-02-15: qty 1

## 2021-02-15 MED ORDER — MEPERIDINE HCL 25 MG/ML IJ SOLN
25.0000 mg | INTRAMUSCULAR | Status: DC | PRN
  Administered 2021-02-15: 25 mg via INTRAVENOUS
  Filled 2021-02-15 (×2): qty 1

## 2021-02-15 MED ORDER — VANCOMYCIN HCL 1000 MG IV SOLR
INTRAVENOUS | Status: AC
  Filled 2021-02-15: qty 20

## 2021-02-15 MED ORDER — VITAMIN D 25 MCG (1000 UNIT) PO TABS
2000.0000 [IU] | ORAL_TABLET | Freq: Every morning | ORAL | Status: DC
  Administered 2021-02-16 – 2021-02-21 (×5): 2000 [IU] via ORAL
  Filled 2021-02-15 (×4): qty 2

## 2021-02-15 MED ORDER — PHENYLEPHRINE HCL-NACL 20-0.9 MG/250ML-% IV SOLN
INTRAVENOUS | Status: AC
  Filled 2021-02-15: qty 250

## 2021-02-15 MED ORDER — PRAVASTATIN SODIUM 40 MG PO TABS
40.0000 mg | ORAL_TABLET | Freq: Every day | ORAL | Status: DC
  Administered 2021-02-15 – 2021-02-21 (×6): 40 mg via ORAL
  Filled 2021-02-15 (×9): qty 1

## 2021-02-15 MED ORDER — CEFAZOLIN SODIUM-DEXTROSE 2-4 GM/100ML-% IV SOLN
2.0000 g | INTRAVENOUS | Status: AC
  Administered 2021-02-15: 2 g via INTRAVENOUS

## 2021-02-15 MED ORDER — SENNOSIDES-DOCUSATE SODIUM 8.6-50 MG PO TABS
1.0000 | ORAL_TABLET | Freq: Every evening | ORAL | Status: DC | PRN
  Administered 2021-02-17 – 2021-02-18 (×2): 1 via ORAL
  Filled 2021-02-15 (×3): qty 1

## 2021-02-15 MED ORDER — ACETAMINOPHEN 10 MG/ML IV SOLN
INTRAVENOUS | Status: DC | PRN
  Administered 2021-02-15: 1000 mg via INTRAVENOUS

## 2021-02-15 MED ORDER — PROPOFOL 500 MG/50ML IV EMUL
INTRAVENOUS | Status: DC | PRN
  Administered 2021-02-15: 30 ug/kg/min via INTRAVENOUS

## 2021-02-15 MED ORDER — DRONEDARONE HCL 400 MG PO TABS
400.0000 mg | ORAL_TABLET | Freq: Two times a day (BID) | ORAL | Status: DC
  Administered 2021-02-16 – 2021-02-22 (×13): 400 mg via ORAL
  Filled 2021-02-15 (×14): qty 1

## 2021-02-15 MED ORDER — BUPIVACAINE HCL (PF) 0.5 % IJ SOLN
INTRAMUSCULAR | Status: AC
  Filled 2021-02-15: qty 10

## 2021-02-15 MED ORDER — LIDOCAINE HCL (PF) 2 % IJ SOLN
INTRAMUSCULAR | Status: AC
  Filled 2021-02-15: qty 5

## 2021-02-15 MED ORDER — SODIUM CHLORIDE 0.9 % IV SOLN: INTRAVENOUS | Status: DC

## 2021-02-15 MED ORDER — CEFAZOLIN SODIUM-DEXTROSE 2-4 GM/100ML-% IV SOLN
INTRAVENOUS | Status: AC
  Administered 2021-02-15: 2 g via INTRAVENOUS
  Filled 2021-02-15: qty 100

## 2021-02-15 MED ORDER — ZOLPIDEM TARTRATE 5 MG PO TABS
5.0000 mg | ORAL_TABLET | Freq: Every evening | ORAL | Status: DC | PRN
  Administered 2021-02-15 – 2021-02-19 (×2): 5 mg via ORAL
  Filled 2021-02-15 (×2): qty 1

## 2021-02-15 MED ORDER — ONDANSETRON HCL 4 MG PO TABS
4.0000 mg | ORAL_TABLET | Freq: Four times a day (QID) | ORAL | Status: DC | PRN
  Filled 2021-02-15: qty 1

## 2021-02-15 MED ORDER — POLYVINYL ALCOHOL 1.4 % OP SOLN
1.0000 [drp] | Freq: Every day | OPHTHALMIC | Status: DC | PRN
  Filled 2021-02-15: qty 15

## 2021-02-15 MED ORDER — AMLODIPINE BESYLATE 5 MG PO TABS
5.0000 mg | ORAL_TABLET | Freq: Every morning | ORAL | Status: DC
  Administered 2021-02-16 – 2021-02-22 (×7): 5 mg via ORAL
  Filled 2021-02-15 (×7): qty 1

## 2021-02-15 MED ORDER — PROPOFOL 10 MG/ML IV BOLUS
INTRAVENOUS | Status: DC | PRN
  Administered 2021-02-15: 10 mg via INTRAVENOUS
  Administered 2021-02-15 (×2): 20 mg via INTRAVENOUS

## 2021-02-15 MED ORDER — ACETAMINOPHEN 10 MG/ML IV SOLN
INTRAVENOUS | Status: AC
  Filled 2021-02-15: qty 100

## 2021-02-15 MED ORDER — MEPERIDINE HCL 25 MG/ML IJ SOLN
INTRAMUSCULAR | Status: AC
  Filled 2021-02-15: qty 1

## 2021-02-15 MED ORDER — SODIUM CHLORIDE 0.9 % IR SOLN
Status: DC | PRN
  Administered 2021-02-15: 600 mL

## 2021-02-15 MED ORDER — TRIAMCINOLONE ACETONIDE 0.1 % EX CREA
1.0000 "application " | TOPICAL_CREAM | Freq: Two times a day (BID) | CUTANEOUS | Status: DC | PRN
  Filled 2021-02-15: qty 15

## 2021-02-15 MED ORDER — SODIUM CHLORIDE (PF) 0.9 % IJ SOLN
INTRAMUSCULAR | Status: DC | PRN
  Administered 2021-02-15: 90 mL via INTRAMUSCULAR

## 2021-02-15 MED ORDER — EPHEDRINE 5 MG/ML INJ
INTRAVENOUS | Status: AC
  Filled 2021-02-15: qty 5

## 2021-02-15 MED ORDER — FENTANYL CITRATE (PF) 100 MCG/2ML IJ SOLN
INTRAMUSCULAR | Status: AC
  Filled 2021-02-15: qty 2

## 2021-02-15 MED ORDER — ACETAMINOPHEN 325 MG PO TABS
325.0000 mg | ORAL_TABLET | Freq: Four times a day (QID) | ORAL | Status: DC | PRN
  Administered 2021-02-17 – 2021-02-19 (×7): 650 mg via ORAL
  Filled 2021-02-15 (×9): qty 2

## 2021-02-15 MED ORDER — ESTRADIOL 1 MG PO TABS
0.5000 mg | ORAL_TABLET | Freq: Every morning | ORAL | Status: DC
  Administered 2021-02-16 – 2021-02-22 (×7): 0.5 mg via ORAL
  Filled 2021-02-15 (×7): qty 0.5

## 2021-02-15 MED ORDER — LIDOCAINE HCL (PF) 1 % IJ SOLN
INTRAMUSCULAR | Status: DC | PRN
Start: 2021-02-15 — End: 2021-02-15
  Administered 2021-02-15: 3 mL via INTRADERMAL

## 2021-02-15 MED ORDER — CHLORHEXIDINE GLUCONATE 0.12 % MT SOLN
OROMUCOSAL | Status: AC
  Administered 2021-02-15: 15 mL via OROMUCOSAL
  Filled 2021-02-15: qty 15

## 2021-02-15 MED ORDER — TRAMADOL HCL 50 MG PO TABS
ORAL_TABLET | ORAL | Status: AC
  Administered 2021-02-15: 50 mg via ORAL
  Filled 2021-02-15: qty 1

## 2021-02-15 MED ORDER — DIPHENHYDRAMINE HCL 12.5 MG/5ML PO ELIX
12.5000 mg | ORAL_SOLUTION | ORAL | Status: DC | PRN
  Administered 2021-02-17: 12.5 mg via ORAL
  Filled 2021-02-15: qty 5
  Filled 2021-02-15: qty 10

## 2021-02-15 MED ORDER — METOCLOPRAMIDE HCL 5 MG/ML IJ SOLN: 5.0000 mg | Freq: Three times a day (TID) | INTRAMUSCULAR | Status: DC | PRN

## 2021-02-15 MED ORDER — CEFAZOLIN SODIUM-DEXTROSE 2-4 GM/100ML-% IV SOLN
INTRAVENOUS | Status: AC
  Filled 2021-02-15: qty 100

## 2021-02-15 MED ORDER — APIXABAN 5 MG PO TABS
5.0000 mg | ORAL_TABLET | Freq: Two times a day (BID) | ORAL | Status: DC
  Administered 2021-02-16 – 2021-02-18 (×5): 5 mg via ORAL
  Filled 2021-02-15 (×5): qty 1

## 2021-02-15 MED ORDER — EPHEDRINE SULFATE (PRESSORS) 50 MG/ML IJ SOLN
INTRAMUSCULAR | Status: DC | PRN
  Administered 2021-02-15 (×2): 10 mg via INTRAVENOUS
  Administered 2021-02-15: 5 mg via INTRAVENOUS

## 2021-02-15 MED ORDER — METOCLOPRAMIDE HCL 5 MG PO TABS
5.0000 mg | ORAL_TABLET | Freq: Three times a day (TID) | ORAL | Status: DC | PRN
  Filled 2021-02-15: qty 2

## 2021-02-15 MED ORDER — MORPHINE SULFATE (PF) 10 MG/ML IV SOLN
INTRAVENOUS | Status: AC
  Filled 2021-02-15: qty 1

## 2021-02-15 MED ORDER — ACETAMINOPHEN 10 MG/ML IV SOLN: 1000.0000 mg | Freq: Once | INTRAVENOUS | Status: DC | PRN

## 2021-02-15 MED ORDER — CEFAZOLIN SODIUM-DEXTROSE 2-4 GM/100ML-% IV SOLN
2.0000 g | Freq: Four times a day (QID) | INTRAVENOUS | Status: AC
  Administered 2021-02-15: 2 g via INTRAVENOUS
  Filled 2021-02-15 (×3): qty 100

## 2021-02-15 MED ORDER — CHLORHEXIDINE GLUCONATE 0.12 % MT SOLN: 15.0000 mL | Freq: Once | OROMUCOSAL | Status: AC

## 2021-02-15 MED ORDER — HYDROCODONE-ACETAMINOPHEN 5-325 MG PO TABS
1.0000 | ORAL_TABLET | ORAL | Status: DC | PRN
  Administered 2021-02-15: 2 via ORAL
  Administered 2021-02-16: 1 via ORAL
  Administered 2021-02-16: 2 via ORAL
  Administered 2021-02-17 – 2021-02-19 (×2): 1 via ORAL
  Administered 2021-02-20: 2 via ORAL
  Filled 2021-02-15: qty 2
  Filled 2021-02-15 (×2): qty 1
  Filled 2021-02-15: qty 2
  Filled 2021-02-15 (×2): qty 1
  Filled 2021-02-15: qty 2

## 2021-02-15 MED ORDER — ONDANSETRON HCL 4 MG/2ML IJ SOLN: 4.0000 mg | Freq: Four times a day (QID) | INTRAMUSCULAR | Status: DC | PRN

## 2021-02-15 MED ORDER — TRAMADOL HCL 50 MG PO TABS
50.0000 mg | ORAL_TABLET | Freq: Four times a day (QID) | ORAL | Status: DC
  Administered 2021-02-15 – 2021-02-16 (×3): 50 mg via ORAL
  Filled 2021-02-15 (×8): qty 1

## 2021-02-15 MED ORDER — ESCITALOPRAM OXALATE 10 MG PO TABS
10.0000 mg | ORAL_TABLET | Freq: Every day | ORAL | Status: DC
  Administered 2021-02-15 – 2021-02-21 (×6): 10 mg via ORAL
  Filled 2021-02-15 (×7): qty 1

## 2021-02-15 MED ORDER — DOCUSATE SODIUM 100 MG PO CAPS
100.0000 mg | ORAL_CAPSULE | Freq: Two times a day (BID) | ORAL | Status: DC
  Administered 2021-02-15 – 2021-02-22 (×14): 100 mg via ORAL
  Filled 2021-02-15 (×14): qty 1

## 2021-02-15 MED ORDER — PHENOL 1.4 % MT LIQD
1.0000 | OROMUCOSAL | Status: DC | PRN
  Filled 2021-02-15: qty 177

## 2021-02-15 MED ORDER — MENTHOL 3 MG MT LOZG
1.0000 | LOZENGE | OROMUCOSAL | Status: DC | PRN
  Filled 2021-02-15: qty 9

## 2021-02-15 MED ORDER — ORAL CARE MOUTH RINSE: 15.0000 mL | Freq: Once | OROMUCOSAL | Status: AC

## 2021-02-15 MED ORDER — NEOMYCIN-POLYMYXIN B GU 40-200000 IR SOLN
Status: AC
  Filled 2021-02-15: qty 20

## 2021-02-15 MED ORDER — ONDANSETRON HCL 4 MG/2ML IJ SOLN
INTRAMUSCULAR | Status: AC
  Filled 2021-02-15: qty 2

## 2021-02-15 MED ORDER — SODIUM CHLORIDE FLUSH 0.9 % IV SOLN
INTRAVENOUS | Status: AC
  Filled 2021-02-15: qty 40

## 2021-02-15 MED ORDER — FENTANYL CITRATE (PF) 100 MCG/2ML IJ SOLN
INTRAMUSCULAR | Status: DC | PRN
  Administered 2021-02-15 (×2): 25 ug via INTRAVENOUS
  Administered 2021-02-15: 50 ug via INTRAVENOUS

## 2021-02-15 MED ORDER — ONDANSETRON HCL 4 MG/2ML IJ SOLN: 4.0000 mg | Freq: Once | INTRAMUSCULAR | Status: DC | PRN

## 2021-02-15 MED ORDER — HYDROCODONE-ACETAMINOPHEN 7.5-325 MG PO TABS
1.0000 | ORAL_TABLET | ORAL | Status: DC | PRN
  Filled 2021-02-15 (×2): qty 2

## 2021-02-15 MED ORDER — BUPIVACAINE HCL (PF) 0.5 % IJ SOLN
INTRAMUSCULAR | Status: DC | PRN
  Administered 2021-02-15: 2.8 mL

## 2021-02-15 MED ORDER — VANCOMYCIN HCL 1000 MG IV SOLR
INTRAVENOUS | Status: DC | PRN
  Administered 2021-02-15: 1000 mg

## 2021-02-15 MED ORDER — PROPOFOL 1000 MG/100ML IV EMUL
INTRAVENOUS | Status: AC
  Filled 2021-02-15: qty 100

## 2021-02-15 MED ORDER — GLYCOPYRROLATE 0.2 MG/ML IJ SOLN
INTRAMUSCULAR | Status: AC
  Filled 2021-02-15: qty 1

## 2021-02-15 MED ORDER — LACTATED RINGERS IV SOLN: INTRAVENOUS | Status: DC

## 2021-02-15 MED ORDER — FLEET ENEMA 7-19 GM/118ML RE ENEM: 1.0000 | ENEMA | Freq: Once | RECTAL | Status: DC | PRN

## 2021-02-15 MED ORDER — GABAPENTIN 300 MG PO CAPS
300.0000 mg | ORAL_CAPSULE | Freq: Three times a day (TID) | ORAL | Status: DC
  Administered 2021-02-15 – 2021-02-16 (×3): 300 mg via ORAL
  Filled 2021-02-15 (×3): qty 1

## 2021-02-15 MED ORDER — PHENYLEPHRINE HCL (PRESSORS) 10 MG/ML IV SOLN
INTRAVENOUS | Status: DC | PRN
  Administered 2021-02-15 (×4): 80 ug via INTRAVENOUS

## 2021-02-15 MED ORDER — SODIUM CHLORIDE 0.9 % IR SOLN
Status: DC | PRN
  Administered 2021-02-15: 3012 mL

## 2021-02-15 MED ORDER — BUPIVACAINE-EPINEPHRINE (PF) 0.25% -1:200000 IJ SOLN
INTRAMUSCULAR | Status: AC
  Filled 2021-02-15: qty 30

## 2021-02-15 MED ORDER — ADULT MULTIVITAMIN W/MINERALS CH
1.0000 | ORAL_TABLET | Freq: Every morning | ORAL | Status: DC
  Administered 2021-02-16 – 2021-02-21 (×3): 1 via ORAL

## 2021-02-15 MED ORDER — FENTANYL CITRATE (PF) 100 MCG/2ML IJ SOLN: 25.0000 ug | INTRAMUSCULAR | Status: DC | PRN

## 2021-02-15 MED ORDER — ALUM & MAG HYDROXIDE-SIMETH 200-200-20 MG/5ML PO SUSP
30.0000 mL | ORAL | Status: DC | PRN
  Administered 2021-02-21: 30 mL via ORAL
  Filled 2021-02-15: qty 30

## 2021-02-15 MED ORDER — PANTOPRAZOLE SODIUM 40 MG PO TBEC
80.0000 mg | DELAYED_RELEASE_TABLET | Freq: Every day | ORAL | Status: DC
  Administered 2021-02-16 – 2021-02-22 (×7): 80 mg via ORAL
  Filled 2021-02-15 (×7): qty 2

## 2021-02-15 MED ORDER — BUPIVACAINE LIPOSOME 1.3 % IJ SUSP
INTRAMUSCULAR | Status: AC
  Filled 2021-02-15: qty 20

## 2021-02-15 MED ORDER — BISACODYL 10 MG RE SUPP
10.0000 mg | Freq: Every day | RECTAL | Status: DC | PRN
  Filled 2021-02-15: qty 1

## 2021-02-15 SURGICAL SUPPLY — 69 items
BLADE SAW 90X13X1.19 OSCILLAT (BLADE) ×2 IMPLANT
BLADE SAW 90X25X1.19 OSCILLAT (BLADE) ×2 IMPLANT
BNDG ELASTIC 6X5.8 VLCR STR LF (GAUZE/BANDAGES/DRESSINGS) ×1 IMPLANT
CANISTER WOUND CARE 500ML ATS (WOUND CARE) ×2 IMPLANT
CEMENT HV SMART SET (Cement) ×2 IMPLANT
CHLORAPREP W/TINT 26 (MISCELLANEOUS) ×4 IMPLANT
COOLER POLAR GLACIER W/PUMP (MISCELLANEOUS) ×2 IMPLANT
COVER BACK TABLE REUSABLE LG (DRAPES) ×2 IMPLANT
CUFF TOURN SGL QUICK 24 (TOURNIQUET CUFF)
CUFF TOURN SGL QUICK 34 (TOURNIQUET CUFF)
CUFF TRNQT CYL 24X4X16.5-23 (TOURNIQUET CUFF) IMPLANT
CUFF TRNQT CYL 34X4.125X (TOURNIQUET CUFF) IMPLANT
DRAPE 3/4 80X56 (DRAPES) ×8 IMPLANT
ELECT CAUTERY BLADE 6.4 (BLADE) ×2 IMPLANT
ELECT REM PT RETURN 9FT ADLT (ELECTROSURGICAL) ×2
ELECTRODE REM PT RTRN 9FT ADLT (ELECTROSURGICAL) ×1 IMPLANT
FEMORAL REV COMP SZ3 R PS (Femur) ×1 IMPLANT
FEMORAL WEDGE DISTAL SZ3 4MM (Miscellaneous) ×2 IMPLANT
GAUZE SPONGE 4X4 12PLY STRL (GAUZE/BANDAGES/DRESSINGS) ×1 IMPLANT
GAUZE XEROFORM 1X8 LF (GAUZE/BANDAGES/DRESSINGS) ×1 IMPLANT
GLOVE SURG ORTHO LTX SZ8 (GLOVE) ×2 IMPLANT
GLOVE SURG SYN 9.0  PF PI (GLOVE) ×1
GLOVE SURG SYN 9.0 PF PI (GLOVE) ×1 IMPLANT
GLOVE SURG UNDER LTX SZ8 (GLOVE) ×2 IMPLANT
GLOVE SURG UNDER POLY LF SZ9 (GLOVE) ×2 IMPLANT
GOWN SRG 2XL LVL 4 RGLN SLV (GOWNS) ×1 IMPLANT
GOWN STRL NON-REIN 2XL LVL4 (GOWNS) ×1
GOWN STRL REUS W/ TWL LRG LVL3 (GOWN DISPOSABLE) ×1 IMPLANT
GOWN STRL REUS W/ TWL XL LVL3 (GOWN DISPOSABLE) ×1 IMPLANT
GOWN STRL REUS W/TWL LRG LVL3 (GOWN DISPOSABLE) ×1
GOWN STRL REUS W/TWL XL LVL3 (GOWN DISPOSABLE) ×1
HOLDER FOLEY CATH W/STRAP (MISCELLANEOUS) ×2 IMPLANT
INSERT TIBIAL FIX SZ2 14 (Insert) ×1 IMPLANT
IV NS IRRIG 3000ML ARTHROMATIC (IV SOLUTION) ×2 IMPLANT
KIT PREVENA INCISION MGT20CM45 (CANNISTER) ×2 IMPLANT
KIT STIMULAN RAPID CURE 5CC (Orthopedic Implant) ×1 IMPLANT
KIT TURNOVER KIT A (KITS) ×2 IMPLANT
KNIFE SCULPS 14X20 (INSTRUMENTS) ×2 IMPLANT
MANIFOLD NEPTUNE II (INSTRUMENTS) ×2 IMPLANT
NDL SPNL 18GX3.5 QUINCKE PK (NEEDLE) ×1 IMPLANT
NDL SPNL 20GX3.5 QUINCKE YW (NEEDLE) ×1 IMPLANT
NEEDLE SPNL 18GX3.5 QUINCKE PK (NEEDLE) ×2 IMPLANT
NEEDLE SPNL 20GX3.5 QUINCKE YW (NEEDLE) ×2 IMPLANT
NS IRRIG 1000ML POUR BTL (IV SOLUTION) ×2 IMPLANT
PACK TOTAL KNEE (MISCELLANEOUS) ×2 IMPLANT
PAD WRAPON POLAR KNEE (MISCELLANEOUS) ×2 IMPLANT
PATELLA RESURFACING MEDACTA 02 (Bone Implant) ×1 IMPLANT
PULSAVAC PLUS IRRIG FAN TIP (DISPOSABLE) ×2
SCALPEL PROTECTED #10 DISP (BLADE) ×4 IMPLANT
STAPLER SKIN PROX 35W (STAPLE) ×2 IMPLANT
STEM EXTENSION 14 65 (Stem) ×1 IMPLANT
STEM EXTENSION FLUTED 12X65 (Stem) ×1 IMPLANT
SUCTION FRAZIER HANDLE 10FR (MISCELLANEOUS) ×1
SUCTION TUBE FRAZIER 10FR DISP (MISCELLANEOUS) ×1 IMPLANT
SUT DVC 2 QUILL PDO  T11 36X36 (SUTURE) ×1
SUT DVC 2 QUILL PDO T11 36X36 (SUTURE) ×1 IMPLANT
SUT ETHIBOND 2 V 37 (SUTURE) ×1 IMPLANT
SUT TICRON 2-0 30IN 311381 (SUTURE) IMPLANT
SUT V-LOC 90 ABS DVC 3-0 CL (SUTURE) ×2 IMPLANT
SWAB CULTURE AMIES ANAERIB BLU (MISCELLANEOUS) IMPLANT
SYR 20ML LL LF (SYRINGE) ×2 IMPLANT
SYR 50ML LL SCALE MARK (SYRINGE) ×4 IMPLANT
TIP FAN IRRIG PULSAVAC PLUS (DISPOSABLE) ×1 IMPLANT
TOWEL OR 17X26 4PK STRL BLUE (TOWEL DISPOSABLE) ×2 IMPLANT
TOWER CARTRIDGE SMART MIX (DISPOSABLE) ×2 IMPLANT
TRAY FOLEY MTR SLVR 16FR STAT (SET/KITS/TRAYS/PACK) ×2 IMPLANT
TRAY TIBIAL REV SZ2 RT (Miscellaneous) ×1 IMPLANT
WATER STERILE IRR 1000ML POUR (IV SOLUTION) ×1 IMPLANT
WRAPON POLAR PAD KNEE (MISCELLANEOUS) ×4

## 2021-02-15 NOTE — Evaluation (Signed)
Physical Therapy Evaluation Patient Details Name: [name] MRN: [id number] DOB: October 31, 1940 Today's Date: 02/15/2021  History of Present Illness  Pt admitted for R TKR revision. History of initial TKR in 2012. Pt is POD 0 at time of evaluation  Clinical Impression  Pt is a pleasant 81 year old female who was admitted for R TKR revision. Pt performs bed mobility/transfers with min assist and ambulation with min assist and RW. Does become dizzy with ambulation, deferred further distance. Pt demonstrates ability to perform 10 SLRs with independence, therefore does not require KI for mobility. Pt demonstrates deficits with strength/ROM/mobility. Pt is currently not at baseline level at this time and is hopeful for dc to SNF. Would benefit from skilled PT to address above deficits and promote optimal return to PLOF; recommend transition to STR upon discharge from acute hospitalization.      Recommendations for follow up therapy are one component of a multi-disciplinary discharge planning process, led by the attending physician.  Recommendations may be updated based on patient status, additional functional criteria and insurance authorization.  Follow Up Recommendations Skilled nursing-short term rehab (<3 hours/day)    Assistance Recommended at Discharge Set up Supervision/Assistance  Patient can return home with the following  A little help with walking and/or transfers    Equipment Recommendations None recommended by PT  Recommendations for Other Services       Functional Status Assessment Patient has had a recent decline in their functional status and demonstrates the ability to make significant improvements in function in a reasonable and predictable amount of time.     Precautions / Restrictions Precautions Precautions: Fall;Knee Precaution Booklet Issued: No Restrictions Weight Bearing Restrictions: Yes RLE Weight Bearing: Weight bearing as tolerated      Mobility  Bed  Mobility Overal bed mobility: Needs Assistance Bed Mobility: Supine to Sit     Supine to sit: Min assist     General bed mobility comments: follows commands well and eager to get to EOB. No reports of pain    Transfers Overall transfer level: Needs assistance Equipment used: Rolling walker (2 wheels) Transfers: Sit to/from Stand Sit to Stand: Min assist           General transfer comment: heavy guarding of operative extremity. Cues for WBing and safe technique using RW    Ambulation/Gait Ambulation/Gait assistance: Min assist Gait Distance (Feet): 15 Feet Assistive device: Rolling walker (2 wheels) Gait Pattern/deviations: Step-to pattern       General Gait Details: ambulated in PACU and became dizzy, limiting further ambulation. Safe technique of RW. O2 sats decreased to 81% on RA, 2L of O2 reapplied  Stairs            Wheelchair Mobility    Modified Rankin (Stroke Patients Only)       Balance Overall balance assessment: Needs assistance Sitting-balance support: Feet supported Sitting balance-Leahy Scale: Good     Standing balance support: Bilateral upper extremity supported Standing balance-Leahy Scale: Fair                               Pertinent Vitals/Pain Pain Assessment Pain Assessment: No/denies pain    Home Living Family/patient expects to be discharged to:: Private residence Living Arrangements: Alone Available Help at Discharge: Family;Friend(s);Available PRN/intermittently (son lives close by) Type of Home: House Home Access: Level entry       Home Layout: One level Home Equipment: BSC/3in1;Grab bars - toilet;Shower seat;Rolling  Walker (2 wheels);Rollator (4 wheels);Adaptive equipment Additional Comments: if pt goes home, she is able to hire caregivers to assist when son is working    Prior Function Prior Level of Function : Independent/Modified Independent;Driving             Mobility Comments: using rollator  for all mobility, very indep       Hand Dominance        Extremity/Trunk Assessment   Upper Extremity Assessment Upper Extremity Assessment: Overall WFL for tasks assessed    Lower Extremity Assessment Lower Extremity Assessment: Generalized weakness (R LE grossly 3/5; L LE grossly 4/5)       Communication   Communication: No difficulties  Cognition Arousal/Alertness: Awake/alert Behavior During Therapy: WFL for tasks assessed/performed Overall Cognitive Status: Within Functional Limits for tasks assessed                                          General Comments      Exercises Total Joint Exercises Goniometric ROM: R knee AAROM: 5-50 degrees supine position Other Exercises Other Exercises: supine ther-ex performed on R LE including AP, quad sets, SLRs, and hip abd/add. 10 reps with min assist   Assessment/Plan    PT Assessment Patient needs continued PT services  PT Problem List Decreased strength;Decreased range of motion;Decreased balance;Decreased mobility;Decreased knowledge of use of DME;Pain       PT Treatment Interventions DME instruction;Gait training;Stair training;Therapeutic exercise;Balance training    PT Goals (Current goals can be found in the Care Plan section)  Acute Rehab PT Goals Patient Stated Goal: to get stronger PT Goal Formulation: With patient Time For Goal Achievement: 03/01/21 Potential to Achieve Goals: Good    Frequency BID     Co-evaluation               AM-PAC PT "6 Clicks" Mobility  Outcome Measure Help needed turning from your back to your side while in a flat bed without using bedrails?: A Little Help needed moving from lying on your back to sitting on the side of a flat bed without using bedrails?: A Little Help needed moving to and from a bed to a chair (including a wheelchair)?: A Little Help needed standing up from a chair using your arms (e.g., wheelchair or bedside chair)?: A Little Help  needed to walk in hospital room?: A Little Help needed climbing 3-5 steps with a railing? : A Lot 6 Click Score: 17    End of Session Equipment Utilized During Treatment: Gait belt Activity Tolerance: Patient tolerated treatment well Patient left: in bed;with bed alarm set Nurse Communication: Mobility status PT Visit Diagnosis: Muscle weakness (generalized) (M62.81);Difficulty in walking, not elsewhere classified (R26.2);Pain Pain - Right/Left: Right Pain - part of body: Knee    Time: 1410-1446 PT Time Calculation (min) (ACUTE ONLY): 36 min   Charges:   PT Evaluation $PT Eval Low Complexity: 1 Low PT Treatments $Gait Training: 8-22 mins $Therapeutic Exercise: 8-22 mins        Greggory Stallion, PT, DPT, GCS [phone]   , 02/15/2021, 4:58 PM

## 2021-02-15 NOTE — Transfer of Care (Signed)
Immediate Anesthesia Transfer of Care Note  Patient: [name]  Procedure(s) Performed: TOTAL KNEE REVISION (Right: Knee)  Patient Location: PACU  Anesthesia Type:Spinal  Level of Consciousness: awake and alert   Airway & Oxygen Therapy: Patient Spontanous Breathing and Patient connected to face mask oxygen  Post-op Assessment: Report given to RN and Post -op Vital signs reviewed and stable  Post vital signs: Reviewed and stable  Last Vitals:  Vitals Value Taken Time  BP 119/70 02/15/21 1000  Temp    Pulse 78 02/15/21 1003  Resp 16 02/15/21 1003  SpO2 99 % 02/15/21 1003  Vitals shown include unvalidated device data.  Last Pain:  Vitals:   02/15/21 0623  PainSc: 6          Complications: No notable events documented.

## 2021-02-15 NOTE — H&P (Signed)
Chief Complaint  Patient presents with   Pre-op Exam  Scheduled for Right total knee revision 02/15/21 with Dr. Rudene Christians    History of the Present Illness: [name] is a 81 y.o. female here today for history and physical for right total knee revision with Dr. Hessie Knows on 02/15/2021. Pain is moderate to severe located along the medial and lateral joint line of the right knee. Pain has been present for nearly 1 year but increasing. She describes throbbing aching pain worse with weightbearing activity. She underwent prior right total knee arthroplasty in 2012 with [employer] triathlon components. A recent bone scan showed loosening of the femoral and tibial components as well as possible patella. X-rays appeared well but bone scan did show loosening of tibial and femoral components. Her pain is been interfering with her ability to walk and activities daily living. She denies any warmth redness or drainage. No fevers. She is currently not take any medications for pain. She has limited her walking secondary to her right knee pain.  I have reviewed past medical, surgical, social and family history, and allergies as documented in the EMR.  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   Irritable bowel syndrome   Other and unspecified hyperlipidemia   Postmenopausal  on HRT   Situational anxiety   Situational stress   Vaginal yeast infection   Valvular heart disease   Past Surgical History: Past Surgical History:  Procedure Laterality Date   COLONOSCOPY 10/16/1993   EGD 08/06/2005   JOINT REPLACEMENT  Right 01/17/2011  Right total knee replacement.   CATARACT EXTRACTION Right 07/13/2014  Dr. Wallace Going   CATARACT EXTRACTION Left 10/05/2014  Dr. Wallace Going   L3-5 DECOMPRESSION 05/10/2020  Dr. Meade Maw at [facility]   nasal surgery by Dr. Caryn Section several years ago for a deviated septum   HYSTERECTOMY age 9  partial   Multiple breast biopsies   Past Family History: Family History  Problem Relation Age of Onset   Myocardial Infarction (Heart attack) Father   Coronary Artery Disease (Blocked arteries around heart) Father   Dementia Mother   Medications: Current Outpatient Medications Ordered in Epic  Medication Sig Dispense Refill   amLODIPine (NORVASC) 5 MG tablet TAKE 1 TABLET BY MOUTH DAILY 90 tablet 1   cephalexin (KEFLEX) 500 MG capsule Take 1 capsule (500 mg total) by mouth 4 (four) times daily for 7 days 28 capsule 0   cholecalciferol (VITAMIN D3) 2,000 unit tablet Take 2,000 Units by mouth once daily.   clindamycin (CLEOCIN) 300 MG capsule Take 2 capsules 30 minutes to 1 hour prior to dental procedure 2 capsule 0   diphenhydrAMINE-acetaminophen (TYLENOL PM) 25-500 mg per tablet Take 1 tablet by mouth at bedtime as needed   ELIQUIS 5 mg tablet TAKE ONE TABLET TWICE DAILY 180 tablet 1   escitalopram oxalate (LEXAPRO) 10 MG tablet TAKE ONE TABLET EVERY DAY 90 tablet 1   estradioL (ESTRACE) 0.5 MG tablet TAKE ONE TABLET BY MOUTH EVERY DAY 90 tablet 1   gabapentin (NEURONTIN) 300 MG capsule Take 1 capsule (300 mg total) by mouth 3 (three) times  daily 90 capsule 11   HYDROcodone-acetaminophen (NORCO) 5-325 mg tablet Take 1 tablet by mouth at bedtime (Patient not taking: Reported on 02/06/2021) 40 tablet 0   meclizine (ANTIVERT) 25 mg tablet Take 1 tablet (25 mg total) by mouth 3 (three) times daily as needed for Dizziness (Patient not taking: Reported on 02/06/2021) 45 tablet 2   MULTAQ 400 mg tablet TAKE ONE TABLET BY MOUTH TWICE DAILY WITH MEALS 180 tablet 1   multivitamin  tablet Take 1 tablet by mouth once daily.   omeprazole (PRILOSEC) 20 MG DR capsule TAKE 1 CAPSULE TWICE DAILY 180 capsule 1   pravastatin (PRAVACHOL) 40 MG tablet TAKE ONE TABLET AT BEDTIME 90 tablet 3   triamcinolone 0.1 % cream Apply topically 2 (two) times daily 30 g 0   No current Epic-ordered facility-administered medications on file.   Allergies: Allergies  Allergen Reactions   Aspirin Other (See Comments)  Causes bleeding in stomach   Methocarbamol Other (See Comments)  Hallucinations   Morphine Other (See Comments)  Disoriented   Oxycontin [Oxycodone] Other (See Comments)  Disoriented    Body mass index is 30.91 kg/m.  Review of Systems: A comprehensive 14 point ROS was performed, reviewed, and the pertinent orthopaedic findings are documented in the HPI.  Vitals:  02/06/21 1413  BP: 120/78    General Physical Examination:  General:  Well developed, well nourished, no apparent distress, normal affect, normal gait with no antalgic component.   HEENT: Head normocephalic, atraumatic, PERRL.   Abdomen: Soft, non tender, non distended, Bowel sounds present.  Heart: Examination of the heart reveals regular, rate, and rhythm. There is no murmur noted on ascultation. There is a normal apical pulse.  Lungs: Lungs are clear to auscultation. There is no wheeze, rhonchi, or crackles. There is normal expansion of bilateral chest walls.  On exam, left knee instability. Varus alignment. Good left knee motion. No pain with left hip range of motion.  Radiographs: CLINICAL DATA:  Pain in right knee. History of total knee  arthroplasty approximately 10-12 years ago.   EXAM:  NUCLEAR MEDICINE 3-PHASE BONE SCAN   TECHNIQUE:  Radionuclide angiographic images, immediate static blood pool  images, and 3-hour delayed static images were obtained of the  bilateral lower extremities after intravenous injection of  radiopharmaceutical.   RADIOPHARMACEUTICALS:  21.22 mCi Tc-87m  MDP IV   COMPARISON:  Prior study from 06/08/2015   FINDINGS:  Vascular phase: Normal. No asymmetric blood flow to either lower  extremity.   Blood pool phase: Normal.  No findings suspicious for synovitis.   Delayed phase: Asymmetric increased uptake noted around the medial  aspects of the femoral and tibial components. Findings are  suspicious for loosening.   IMPRESSION:  Abnormal/asymmetric increased uptake noted around the medial aspects  of the femoral and tibial prostheses components suspicious for  loosening.   Electronically Signed    By: Marijo Sanes M.D.    On: 11/08/2020 13:21  Assessment: ICD-10-CM  1. Loosening of knee joint prosthesis, subsequent encounter T84.038D  Z96.659  2. Pain due to total right knee replacement, initial encounter (CMS-HCC) T84.84XA  Z96.651   Plan: 18. 81 year old female with history of right total knee arthroplasty in 2012. She has had nearly 1 year of right knee pain. Bone scan of the right knee shows loosening of the tibial and femoral components. Patient's pain throughout the knee is interfering with her activities daily living and quality of life. Risks, benefits, complications of a right total knee  revision have been discussed with the patient. Patient has agreed and consented procedure with Dr. Hessie Knows on 02/15/2021.   Electronically signed by [name], PA at 02/06/2021 2:54 PM EST   Reviewed  H+P. No changes noted.

## 2021-02-15 NOTE — Anesthesia Preprocedure Evaluation (Signed)
Anesthesia Evaluation  Patient identified by MRN, date of birth, ID band Patient awake    Reviewed: Allergy & Precautions, NPO status , Patient's Chart, lab work & pertinent test results  History of Anesthesia Complications Negative for: history of anesthetic complications  Airway Mallampati: III  TM Distance: <3 FB Neck ROM: full    Dental no notable dental hx. (+) Chipped   Pulmonary neg pulmonary ROS, neg shortness of breath,    Pulmonary exam normal breath sounds clear to auscultation       Cardiovascular Exercise Tolerance: Good hypertension, (-) angina(-) Past MI and (-) DOE Normal cardiovascular exam+ dysrhythmias Atrial Fibrillation  Rhythm:Regular Rate:Normal - Systolic murmurs    Neuro/Psych PSYCHIATRIC DISORDERS Anxiety negative neurological ROS     GI/Hepatic Neg liver ROS, GERD  Medicated and Controlled,  Endo/Other  negative endocrine ROS  Renal/GU      Musculoskeletal   Abdominal   Peds  Hematology negative hematology ROS (+)   Anesthesia Other Findings Past Medical History: No date: Anxiety No date: Aortic atherosclerosis (HCC) No date: Current use of long term anticoagulation     Comment:  APIXABAN No date: Dysrhythmia     Comment:  A-fib No date: GERD (gastroesophageal reflux disease) 07/2019: History of 2019 novel coronavirus disease (COVID-19) No date: Hypercholesteremia No date: Hypertension No date: IDA (iron deficiency anemia) No date: Paroxysmal atrial fibrillation (HCC) No date: Skin cancer     Comment:  Skin - squamous  Past Surgical History: No date: ABDOMINAL HYSTERECTOMY 11/21/2015: BREAST CYST ASPIRATION; Left     Comment:  resolved 1973: BREAST EXCISIONAL BIOPSY; Left     Comment:  benign 07/13/2014: CATARACT EXTRACTION W/PHACO; Right     Comment:  Procedure: CATARACT EXTRACTION PHACO AND INTRAOCULAR               LENS PLACEMENT ([place]);  Surgeon: Leandrew Koyanagi, MD;                Location: [facility];  Service: Ophthalmology;                Laterality: Right; 10/05/2014: CATARACT EXTRACTION W/PHACO; Left     Comment:  Procedure: CATARACT EXTRACTION PHACO AND INTRAOCULAR               LENS PLACEMENT ([facility]);  Surgeon: Leandrew Koyanagi, MD;               Location: [facility];  Service: Ophthalmology;                Laterality: Left; No date: JOINT REPLACEMENT     Comment:  knee replacement right 05/10/2020: LUMBAR LAMINECTOMY/DECOMPRESSION MICRODISCECTOMY; N/A     Comment:  Procedure: L3-5 DECOMPRESSION;  Surgeon: Meade Maw, MD;  Location: [facility];  Service: Neurosurgery;              Laterality: N/A; No date: SEPTOPLASTY  BMI    Body Mass Index: 32.30 kg/m      Reproductive/Obstetrics negative OB ROS                             Anesthesia Physical  Anesthesia Plan  ASA: 3  Anesthesia Plan: Spinal   Post-op Pain Management: Ofirmev IV (intra-op)   Induction: Intravenous  PONV Risk Score and Plan: 2 and Ondansetron, Dexamethasone, Propofol infusion, TIVA, Midazolam and Treatment may vary due to  age or medical condition  Airway Management Planned: Natural Airway  Additional Equipment: None  Intra-op Plan:   Post-operative Plan:   Informed Consent: I have reviewed the patients History and Physical, chart, labs and discussed the procedure including the risks, benefits and alternatives for the proposed anesthesia with the patient or authorized representative who has indicated his/her understanding and acceptance.       Plan Discussed with: CRNA and Surgeon  Anesthesia Plan Comments: (Patient > 72 hours out from last eliquis dose. Discussed R/B/A of neuraxial anesthesia technique with patient: - rare risks of spinal/epidural hematoma, nerve damage, infection - Risk of PDPH - Risk of nausea and vomiting - Risk of conversion to general anesthesia and its associated  risks, including sore throat, damage to lips/eyes/teeth/oropharynx, and rare risks such as cardiac and respiratory events. - Risk of allergic reactions  Discussed the role of CRNA in patient's perioperative care.  Patient voiced understanding.)       Anesthesia Quick Evaluation

## 2021-02-15 NOTE — Anesthesia Procedure Notes (Signed)
Spinal  Patient location during procedure: OR Start time: 02/15/2021 7:26 AM End time: 02/15/2021 7:32 AM Reason for block: surgical anesthesia Staffing Performed: resident/CRNA  Anesthesiologist: Arita Miss, MD Resident/CRNA: Tollie Eth, CRNA Preanesthetic Checklist Completed: patient identified, IV checked, site marked, risks and benefits discussed, surgical consent, monitors and equipment checked, pre-op evaluation and timeout performed Spinal Block Patient position: sitting Prep: Betadine Patient monitoring: heart rate, continuous pulse ox, blood pressure and cardiac monitor Approach: midline Location: L4-5 Injection technique: single-shot Needle Needle type: Whitacre and Introducer  Needle gauge: 24 G Needle length: 9 cm Assessment Events: CSF return Additional Notes Negative paresthesia. Negative blood return. Positive free-flowing CSF. Expiration date of kit checked and confirmed. Patient tolerated procedure well, without complications.

## 2021-02-15 NOTE — Op Note (Signed)
02/15/2021  10:08 AM  PATIENT:  [name]  81 y.o. 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  PROCEDURE:  Procedure(s): TOTAL KNEE REVISION (Right) All 3 components  SURGEON: Laurene Footman, MD  ASSISTANTS: Rachelle Hora, PA-C  ANESTHESIA:   spinal  EBL:  Total I/O In: 1200 [I.V.:1000; IV Piggyback:200] Out: 90 [Urine:40; Blood:50]  BLOOD ADMINISTERED:none  DRAINS:  Incisional wound VAC    LOCAL MEDICATIONS USED:  MARCAINE    and OTHER  He had vacations coming up with for  SPECIMEN:  Exparel and Marcaine  DISPOSITION OF SPECIMEN:  N/A  COUNTS:  YES  TOURNIQUET:   Total Tourniquet Time Documented: Thigh (Right) - 26 minutes Total: Thigh (Right) - 26 minutes   IMPLANTS: Medacta 14 mm stem on the femur 12 mm on the tibia with a size 3 revision femur 4 mm distal augments medial and lateral, size 2 tibial component with 14 mm stabilized tibial insert  DICTATION: .Dragon Dictation patient was brought to the operating room and after adequate spinal anesthesia was obtained the right leg was prepped and draped in usual sterile fashion.  After patient identification and timeout procedures were completed a midline skin incision was made utilizing the prior incision extending proximal and distal about a centimeter.  Medial parapatellar arthrotomy was performed there was slight instability to the knee to varus valgus stress.  The patella was mobilized excising scar posterior to the patellar tendon medial capsule elevated for adequate exposure.  Femur was approached first with a small saw being used at the femoral trochlea to loosen the implant followed by thin flexible osteotomes the tibial component tibial polyethylene was removed and the femoral femur was then removed without difficulty and almost no bone loss.  The tibial baseplate was  then removed using a thin flexible osteotome getting in the metal cement interface and remove the implant without any difficulty.  A freshen up cut was used on the proximal tibia to remove the cement and then cement extraction equipment used to remove all the cement from the tibial canal.  The tibia was then prepared using hand reamers up to 4012 mm repeating the proximal tibia cut to get to fresh bone sizing this to a size 2 and doing proximal tibial preparation with a size 2 right femur 12 mm stem.  Going to the femoral side distal femoral drill hole was made hand reaming up to 14 mm distal cut to freshen up the ends followed by application of the 3 right distal femur with anterior posterior and chamfer cuts followed by the box cut for the implant.  These components were removed and trial made with 4 mm augments on the femur with this a 14 mm insert gave good stability was chosen as the final components.  At this point the tourniquet was raised and the patella was prepared it was after excising scar around it there was ability to place the Bovie under the plastic consistent with loosening the component was removed without difficulty freshen up cut made the prior plastic pegs came out repeat drilling carried out and it sized to a size 2.  The knee was irrigated with pulsatile lavage and the bony surfaces dried.  Tibial component was cemented into place first with a 14 mm insert excess cement being removed followed by the size 3 right femur with stem again removing excess cement and  hold the knee in extension while the patellar button was cemented in place.  Betadine irrigation solution was left in place as the cement set.  After the cemented set and excess cement had been removed on tracking there was good tracking the patellofemoral joint and minimal lateral release and internally was carried out.  The tourniquet was let down at this point there is minimal bleeding.  Stimulant beads with vancomycin were placed in  the gutters and the knee was closed using multiple #2 Ethibond followed by a heavy Quill 3 OV lock subcutaneously and skin staples.  Above medication was injected after bone cuts and prior to implantation.  PLAN OF CARE: Admit to inpatient   PATIENT DISPOSITION:  PACU - hemodynamically stable.

## 2021-02-15 NOTE — Progress Notes (Signed)
PT at bedside.  Patient tolerated crackers and gingerale/water. Medicated for discomfort per orders. Son updated

## 2021-02-15 NOTE — Progress Notes (Signed)
Patient waiting on bed available. Lunch tray provided:  100% intake. Awake/alert x4. Family updated

## 2021-02-16 ENCOUNTER — Encounter: Payer: Self-pay | Admitting: Orthopedic Surgery

## 2021-02-16 LAB — BASIC METABOLIC PANEL
Anion gap: 7 (ref 5–15)
BUN: 26 mg/dL — ABNORMAL HIGH (ref 8–23)
CO2: 23 mmol/L (ref 22–32)
Calcium: 8.3 mg/dL — ABNORMAL LOW (ref 8.9–10.3)
Chloride: 103 mmol/L (ref 98–111)
Creatinine, Ser: 1.54 mg/dL — ABNORMAL HIGH (ref 0.44–1.00)
GFR, Estimated: 34 mL/min — ABNORMAL LOW (ref >60)
Glucose, Bld: 121 mg/dL — ABNORMAL HIGH (ref 70–99)
Potassium: 4.2 mmol/L (ref 3.5–5.1)
Sodium: 133 mmol/L — ABNORMAL LOW (ref 135–145)

## 2021-02-16 LAB — CBC
HCT: 25.3 % — ABNORMAL LOW (ref 36.0–46.0)
Hemoglobin: 7.9 g/dL — ABNORMAL LOW (ref 12.0–15.0)
MCH: 30.9 pg (ref 26.0–34.0)
MCHC: 31.2 g/dL (ref 30.0–36.0)
MCV: 98.8 fL (ref 80.0–100.0)
Platelets: 212 10*3/uL (ref 150–400)
RBC: 2.56 MIL/uL — ABNORMAL LOW (ref 3.87–5.11)
RDW: 13 % (ref 11.5–15.5)
WBC: 9.5 10*3/uL (ref 4.0–10.5)
nRBC: 0 % (ref 0.0–0.2)

## 2021-02-16 MED ORDER — FE FUMARATE-B12-VIT C-FA-IFC PO CAPS
1.0000 | ORAL_CAPSULE | Freq: Two times a day (BID) | ORAL | Status: DC
  Administered 2021-02-16 – 2021-02-22 (×13): 1 via ORAL
  Filled 2021-02-16 (×15): qty 1

## 2021-02-16 MED ORDER — HYDROCODONE-ACETAMINOPHEN 5-325 MG PO TABS: 1.0000 | ORAL_TABLET | ORAL | 0 refills | Status: DC | PRN

## 2021-02-16 MED ORDER — TRAMADOL HCL 50 MG PO TABS: 50.0000 mg | ORAL_TABLET | Freq: Four times a day (QID) | ORAL | 0 refills | Status: DC

## 2021-02-16 MED ORDER — SENNOSIDES-DOCUSATE SODIUM 8.6-50 MG PO TABS: 1.0000 | ORAL_TABLET | Freq: Every evening | ORAL | Status: DC | PRN

## 2021-02-16 NOTE — Progress Notes (Signed)
PT Cancellation Note  Patient Details Name: [name] MRN: [id number] DOB: 04-Dec-1940   Cancelled Treatment:     Therapist returned again in pm, pt resting very soundly, unable to stay awake or keep eyes open. Will plan to continue PT tomorrow am.   [name] 02/16/2021, 3:20 PM

## 2021-02-16 NOTE — Anesthesia Postprocedure Evaluation (Signed)
Anesthesia Post Note  Patient: [name]  Procedure(s) Performed: TOTAL KNEE REVISION (Right: Knee)  Patient location during evaluation: Nursing Unit Anesthesia Type: Spinal Level of consciousness: awake Respiratory status: spontaneous breathing Cardiovascular status: stable Anesthetic complications: no   No notable events documented.   Last Vitals:  Vitals:   02/15/21 2300 02/16/21 0430  BP: (!) 119/54 (!) 108/49  Pulse: 78 77  Resp: 18 18  Temp: 36.8 C 36.4 C  SpO2: 90% 93%    Last Pain:  Vitals:   02/15/21 2027  PainSc: Asleep                 Lerry Liner

## 2021-02-16 NOTE — TOC Progression Note (Signed)
Transition of Care [facility]) - Progression Note    Patient Details  Name: [name] MRN: [id number] Date of Birth: [birth year]  Transition [facility]) CM/SW [place], RN Phone Number: 02/16/2021, 12:33 PM  Clinical Narrative:   The patient prefers [facility], [facility] has made an offer, the patient agrees to the bed offer, anticipate DC on Monday, that is when the facility will be able to accept her, TOC will start ins process on Sunday         Expected Discharge Plan and Services                                                 Social Determinants of Health (SDOH) Interventions    Readmission Risk Interventions No flowsheet data found.

## 2021-02-16 NOTE — Discharge Summary (Addendum)
Physician Discharge Summary  Patient ID: [name] MRN: [id number] DOB/AGE: 1940-11-30 81 y.o.  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 female who was admitted 02/15/2021 with a diagnosis of S/P revision of total knee, right and went to the operating room on 02/15/2021 and underwent the above named procedures.    Surgeries: Procedure(s): TOTAL KNEE REVISION on 02/15/2021 Patient tolerated the surgery well. Taken to PACU where she was stabilized and then transferred to the orthopedic floor.  Started on Eliquis, TEDs, SCDs. No evidence of DVT. Negative [place]. Physical therapy started on day #1 for gait training and transfer. OT started day #1 for ADL and assisted devices.  Patient's foley was d/c on day #1. Patient's IV  was d/c on day #1. Acute post op blood loss anemia on post op day 1, Hgb 7.9. Patient started on Iron supplement.  On post op day 2 Hgb down to 6.8, 1 unit of PRBC given. On post op day 3 and 4 Hgb stable at 8.6.    On post op day 4-5 patient with Altered mental status. Neuro consult ordered and sedating medications held. CT head -, EEG obtained.  On  post op day 6 patient mental status at baseline. Pain controlled with tylenol. Neuro work up negative. Patient stable and ready for discharge to SNF.   Patient discharge held until post op day 7 due to SNF. On post op day 7 patient and stable for discharge to SNF.                  TOTAL KNEE REPLACEMENT POSTOPERATIVE DIRECTIONS  Knee Rehabilitation, Guidelines Following Surgery  Results after knee surgery are often greatly improved when you follow the exercise, range of motion and muscle strengthening exercises prescribed by your doctor. Safety measures are also important to protect the knee from further injury. Any time any of these exercises cause you to have increased pain or swelling in your knee joint, decrease the amount until you are comfortable again and slowly increase them. If you have problems or questions, call your caregiver or physical therapist for advice.   HOME CARE INSTRUCTIONS  Remove items at home which could result in a fall. This includes throw rugs or furniture in walking pathways.  Continue to use polar care unit on the knee for pain and swelling from surgery. You may notice swelling that will progress down to the foot and ankle.  This is normal after surgery.  Elevate the leg when you are not up walking on it.   Continue to use the  breathing machine which will help keep your temperature down.  It is common for your temperature to cycle up and down following surgery, especially at night when you are not up moving around and exerting yourself.  The breathing machine keeps your lungs expanded and your temperature down. Do not place pillow under knee, focus on keeping the knee STRAIGHT while resting  DIET You may resume your previous home diet once your are discharged from the hospital.  DRESSING / WOUND CARE / SHOWERING Please remove provena negative pressure dressing on 02/28/2021 and apply honey comb dressing. Keep dressing clean and dry at all times.    ACTIVITY Walk with your walker as instructed. Use walker as long as suggested by your caregivers. Avoid periods of inactivity such as sitting longer than an hour when not asleep. This helps prevent blood clots.  You may resume a sexual relationship in one month or when given the OK by your doctor.  You may return to work once you are cleared by your doctor.  Do not drive a car for 6 weeks or until released by you surgeon.  Do not drive while taking narcotics.  WEIGHT BEARING Weight bearing as tolerated with assist device (walker, cane, etc) as directed, use it as long as suggested by your surgeon or therapist, typically at least 4-6 weeks.  POSTOPERATIVE CONSTIPATION PROTOCOL Constipation - defined medically as fewer than three stools per week and severe constipation as less than one stool per week.  One of the most common issues patients have following surgery is constipation.  Even if you have a regular bowel pattern at home, your normal regimen is likely to be disrupted due to multiple reasons following surgery.  Combination of anesthesia, postoperative narcotics, change in appetite and fluid intake all can affect your bowels.  In order to avoid complications following surgery, here are some recommendations in order to help you during your recovery period.  Colace (docusate) - Pick up an over-the-counter form of Colace or another stool softener and take twice a day as long as you are requiring postoperative pain medications.  Take with a full glass of water daily.  If you experience loose stools or diarrhea, hold the colace until you stool forms back up.  If your symptoms do not get better within 1 week or if they get worse, check with your doctor.  Dulcolax (bisacodyl) - Pick up over-the-counter and take as directed by the product packaging as needed to assist with the movement of your bowels.  Take with a full glass of water.  Use this product as needed if not relieved by Colace only.    MiraLax (polyethylene glycol) - Pick up over-the-counter to have on hand.  MiraLax is a solution that will increase the amount of water in your bowels to assist with bowel movements.  Take as directed and can mix with a glass of water, juice, soda, coffee, or tea.  Take if you go more than two days without a movement. Do not use MiraLax more than once per day. Call your doctor if you are still constipated or irregular after using this medication for 7 days in a row.  If you continue to have problems with postoperative constipation, please contact the office for further assistance and recommendations.  If you experience "the worst abdominal pain ever" or develop nausea or vomiting, please contact the office immediatly for further recommendations for treatment.  ITCHING  If you experience itching with your medications, try taking only a single pain  pill, or even half a pain pill at a time.  You can also use Benadryl over the counter for itching or also to help with sleep.   TED HOSE STOCKINGS Wear the elastic stockings on both legs for six weeks following surgery during the day but you may remove then at night for sleeping.  MEDICATIONS See your medication summary on the After Visit Summary that the nursing staff will review with you prior to discharge.  You may have some home medications which will be placed on hold until you complete the course of blood thinner medication.  It is important for you to complete the blood thinner medication as prescribed by your surgeon.  Continue your approved medications as instructed at time of discharge.  PRECAUTIONS If you experience chest pain or shortness of breath - call 911 immediately for transfer to the hospital emergency department.  If you develop a fever greater that 101 F, purulent drainage from wound, increased redness or drainage from wound, foul odor from the wound/dressing, or calf pain - CONTACT YOUR SURGEON.                                                    FOLLOW-UP APPOINTMENTS Make sure you keep all of your appointments after your operation with your surgeon and caregivers. You should call the office at the above phone number and make an appointment for approximately two weeks after the date of your surgery or on the date instructed by your surgeon outlined in the "After Visit Summary".   RANGE OF MOTION AND STRENGTHENING EXERCISES  Rehabilitation of the knee is important following a knee injury or an operation. After just a few days of immobilization, the muscles of the thigh which control the knee become weakened and shrink (atrophy). Knee exercises are designed to build up the tone and strength of the thigh muscles and to improve knee motion. Often times heat used for twenty to thirty minutes before working out will loosen up your tissues and help with improving the range of motion but do not use heat for the first two weeks following surgery. These exercises can be done on a training (exercise) mat, on the floor, on a table or on a bed. Use what ever works the best and is most comfortable for you Knee exercises include:  Leg Lifts - While your knee is still immobilized in a splint or cast, you can do straight leg raises. Lift the leg to 60 degrees, hold for 3 sec, and slowly lower the leg. Repeat 10-20 times 2-3 times daily. Perform this exercise against resistance later as your knee gets better.  Quad and Hamstring Sets - Tighten up the muscle on the front of the thigh (Quad) and hold for 5-10 sec. Repeat this 10-20 times hourly. Hamstring sets are done by pushing the foot backward against an object and holding for 5-10 sec. Repeat as with quad sets.  Leg Slides: Lying on your back, slowly slide your foot toward your buttocks, bending your knee up off the floor (only go as far as is comfortable). Then slowly slide your foot back down until your leg is flat on the floor again. Angel Wings: Lying on your back spread your legs to the side  as far apart as you can without causing discomfort.  A rehabilitation program following serious knee injuries  can speed recovery and prevent re-injury in the future due to weakened muscles. Contact your doctor or a physical therapist for more information on knee rehabilitation.   IF YOU ARE TRANSFERRED TO A SKILLED REHAB FACILITY If the patient is transferred to a skilled rehab facility following release from the hospital, a list of the current medications will be sent to the facility for the patient to continue.  When discharged from the skilled rehab facility, please have the facility set up the patient's [place] prior to being released. Also, the skilled facility will be responsible for providing the patient with their medications at time of release from the facility to include their pain medication, the muscle relaxants, and their blood thinner medication. If the patient is still at the rehab facility at time of the two week follow up appointment, the skilled rehab facility will also need to assist the patient in arranging follow up appointment in our office and any transportation needs.  MAKE SURE YOU:  Understand these instructions.  Get help right away if you are not doing well or get worse.                     She was given perioperative antibiotics:  Anti-infectives (From admission, onward)    Start     Dose/Rate Route Frequency Ordered Stop   02/15/21 1330  ceFAZolin (ANCEF) IVPB 2g/100 mL premix        2 g 200 mL/hr over 30 Minutes Intravenous Every 6 hours 02/15/21 1021 02/15/21 2015   02/15/21 1310  ceFAZolin (ANCEF) 2-4 GM/100ML-% IVPB       Note to Pharmacy: Marni Griffon K: cabinet override      02/15/21 1310 02/16/21 0114   02/15/21 0806  vancomycin (VANCOCIN) powder  Status:  Discontinued          As needed 02/15/21 0807 02/15/21 0957   02/15/21 0617  ceFAZolin (ANCEF) 2-4 GM/100ML-% IVPB       Note to Pharmacy: Lyman Bishop T:  cabinet override      02/15/21 0617 02/15/21 1342   02/15/21 0600  ceFAZolin (ANCEF) IVPB 2g/100 mL premix        2 g 200 mL/hr over 30 Minutes Intravenous On call to O.R. 02/15/21 0028 02/15/21 0810     .  She was given sequential compression devices, early ambulation, and Eliquis TEDs for DVT prophylaxis.  She benefited maximally from the hospital stay and there were no complications.    Recent vital signs:  Vitals:   02/21/21 0351 02/21/21 0833  BP: (!) 141/48 (!) 156/50  Pulse: 63 73  Resp: 18 15  Temp: 98 F (36.7 C) 98.4 F (36.9 C)  SpO2: 95% 94%    Recent laboratory studies:  Lab Results  Component Value Date   HGB 8.7 (L) 02/20/2021   HGB 8.6 (L) 02/19/2021   HGB 8.6 (L) 02/18/2021   Lab Results  Component Value Date   WBC 10.1 02/20/2021   PLT 272 02/20/2021   Lab Results  Component Value Date   INR 1.7 (H) 05/03/2020   Lab Results  Component Value Date   NA 137 02/20/2021   K 3.4 (L) 02/20/2021   CL 104 02/20/2021   CO2 24 02/20/2021   BUN 19 02/20/2021   CREATININE 1.00 02/20/2021   GLUCOSE 116 (H) 02/20/2021    Discharge Medications:   Allergies as of 02/21/2021       Reactions   Aspirin  Causes bleeding in stomach   Nsaids Other (See Comments)   Bleeding in stomach   Methocarbamol Other (See Comments)   Hallucinations   Morphine And Related Other (See Comments)   Disoriented hallucinations   Oxycodone Other (See Comments)   Disoriented Hallucinations        Medication List     TAKE these medications    acetaminophen 500 MG tablet Commonly known as: TYLENOL Take 2 tablets (1,000 mg total) by mouth every 6 (six) hours as needed for mild pain (pain score 1-3 or temp > 100.5).   amLODipine 5 MG tablet Commonly known as: NORVASC Take 5 mg by mouth in the morning.   carboxymethylcellulose 0.5 % Soln Commonly known as: REFRESH PLUS Place 1 drop into both eyes daily as needed (dry eyes).   diphenhydramine-acetaminophen  25-500 MG Tabs tablet Commonly known as: TYLENOL PM Take 1 tablet by mouth at bedtime.   dronedarone 400 MG tablet Commonly known as: MULTAQ Take 400 mg by mouth 2 (two) times daily with a meal.   Eliquis 5 MG Tabs tablet Generic drug: apixaban Take 1 tablet by mouth 2 times daily. Call clinic - overdue for appt with Dr Rayann Heman. What changed:  how much to take how to take this when to take this additional instructions   escitalopram 10 MG tablet Commonly known as: LEXAPRO Take 10 mg by mouth at bedtime.   estradiol 0.5 MG tablet Commonly known as: ESTRACE Take 0.5 mg by mouth every morning.   gabapentin 300 MG capsule Commonly known as: NEURONTIN Take 300 mg by mouth 3 (three) times daily.   multivitamin tablet Take 1 tablet by mouth every morning.   omeprazole 20 MG capsule Commonly known as: PRILOSEC Take 20 mg by mouth 2 (two) times daily.   pravastatin 40 MG tablet Commonly known as: PRAVACHOL Take 40 mg by mouth at bedtime.   senna-docusate 8.6-50 MG tablet Commonly known as: Senokot-S Take 1 tablet by mouth at bedtime as needed for mild constipation.   traZODone 50 MG tablet Commonly known as: DESYREL Take 0.5 tablets (25 mg total) by mouth at bedtime as needed for sleep.   triamcinolone cream 0.1 % Commonly known as: KENALOG Apply 1 application topically 2 (two) times daily as needed (rash).   Vitamin D 50 MCG (2000 UT) Caps Take 2,000 Units by mouth in the morning.               Durable Medical Equipment  (From admission, onward)           Start     Ordered   02/15/21 1022  DME Walker rolling  Once       Question Answer Comment  Walker: With Johnson Wheels   Patient needs a walker to treat with the following condition S/P revision of total knee, right      02/15/21 1021   02/15/21 1022  DME 3 n 1  Once        02/15/21 1021   02/15/21 1022  DME Bedside commode  Once       Question:  Patient needs a bedside commode to treat with the  following condition  Answer:  S/P revision of total knee, right   02/15/21 1021            Diagnostic Studies: DG Knee 1-2 Views Right  Result Date: 02/15/2021 CLINICAL DATA:  Right knee arthroplasty revision EXAM: RIGHT KNEE - 1-2 VIEW COMPARISON:  01/17/2011 FINDINGS: Revision arthroplasty without acute  hardware complication. Expected soft tissue edema and gas anteriorly. Antibiotic spacers surround the femoral component. IMPRESSION: Expected appearance after revision knee arthroplasty. Electronically Signed   By: Abigail Miyamoto M.D.   On: 02/15/2021 11:01   CT HEAD WO CONTRAST (5MM)  Result Date: 02/21/2021 CLINICAL DATA:  Mental status change, persistent or worsening EXAM: CT HEAD WITHOUT CONTRAST TECHNIQUE: Contiguous axial images were obtained from the base of the skull through the vertex without intravenous contrast. RADIATION DOSE REDUCTION: This exam was performed according to the departmental dose-optimization program which includes automated exposure control, adjustment of the mA and/or kV according to patient size and/or use of iterative reconstruction technique. COMPARISON:  None. FINDINGS: Brain: No evidence of acute intracranial hemorrhage or extra-axial collection.No evidence of mass lesion/concern mass effect.The ventricles are normal in size.Scattered subcortical and periventricular white matter hypodensities, nonspecific but likely sequela of chronic small vessel ischemic disease. Vascular: No hyperdense vessel or unexpected calcification. Skull: Normal. Negative for fracture or focal lesion. Sinuses/Orbits: No acute finding. Other: None. IMPRESSION: No acute intracranial abnormality. 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 Electronically Signed   By: Maurine Simmering M.D.   On: 02/21/2021 08:30    Disposition:      Contact information for follow-up providers     Duanne Guess, PA-C Follow up in 2 week(s).   Specialties: Orthopedic Surgery, Emergency  Medicine Contact information: [place] [phone]              Contact information for after-discharge care     [place] [facility] .   Service: Skilled Nursing Contact information: [place] [phone]                      Signed: [name] 02/21/2021, 2:33 PM

## 2021-02-16 NOTE — NC FL2 (Signed)
[place] LEVEL OF CARE SCREENING TOOL     IDENTIFICATION  Patient Name: [name] Birthdate: 1940-04-11 Sex: female Admission Date (Current Location): 02/15/2021  [facility] and [place] Number:  Engineering geologist and Address:  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    Orientation RESPIRATION BLADDER Height & Weight     Self, Time, Situation, Place  Normal Continent, External catheter Weight:   Height:     BEHAVIORAL SYMPTOMS/MOOD NEUROLOGICAL BOWEL NUTRITION STATUS      Continent Diet (regular)  AMBULATORY STATUS COMMUNICATION OF NEEDS Skin   Extensive Assist Verbally Normal, Surgical wounds                       Personal Care Assistance Level of Assistance  Bathing, Feeding, Dressing Bathing Assistance: Limited assistance Feeding assistance: Independent Dressing Assistance: Limited assistance     Functional Limitations Info             SPECIAL CARE FACTORS FREQUENCY  PT (By licensed PT), OT (By licensed OT)     PT Frequency: 5 times per week OT Frequency: 5 times per week            Contractures Contractures Info: Not present    Additional Factors Info  Code Status, Allergies Code Status Info: full code Allergies Info: Aspirin, Nsaids, Methocarbamol, Morphine And Related, Oxycodone           Current Medications (02/16/2021):  This is the current hospital active medication list Current  Facility-Administered Medications  Medication Dose Route Frequency Provider Last Rate Last Admin   0.9 %  sodium chloride infusion   Intravenous Continuous Hessie Knows, MD 75 mL/hr at 02/15/21 1312 New Bag at 02/15/21 1312   acetaminophen (TYLENOL) tablet 325-650 mg  325-650 mg Oral Q6H PRN Hessie Knows, MD       alum & mag hydroxide-simeth (MAALOX/MYLANTA) 200-200-20 MG/5ML suspension 30 mL  30 mL Oral Q4H PRN Hessie Knows, MD       amLODipine (NORVASC) tablet 5 mg  5 mg Oral q AM Dorise Hiss C, PA-C   5 mg at 02/16/21 0618   apixaban (ELIQUIS) tablet 5 mg  5 mg Oral BID Hessie Knows, MD       bisacodyl (DULCOLAX) suppository 10 mg  10 mg Rectal Daily PRN Hessie Knows, MD       cholecalciferol (VITAMIN D3) tablet 2,000 Units  2,000 Units Oral q AM Hessie Knows, MD       diphenhydrAMINE (BENADRYL) 12.5 MG/5ML elixir 12.5-25 mg  12.5-25 mg Oral Q4H PRN Hessie Knows, MD       docusate sodium (COLACE) capsule 100 mg  100 mg Oral BID Hessie Knows, MD   100 mg at 02/15/21 2215   dronedarone (MULTAQ) tablet 400 mg  400 mg Oral BID WC Hessie Knows, MD   400 mg at 02/16/21 0829   escitalopram (LEXAPRO) tablet 10 mg  10 mg  Oral QHS Hessie Knows, MD   10 mg at 02/15/21 2215   estradiol (ESTRACE) tablet 0.5 mg  0.5 mg Oral q morning Hessie Knows, MD       ferrous [id number] C-folic acid (TRINSICON / FOLTRIN) capsule 1 capsule  1 capsule Oral BID PC Hessie Knows, MD       gabapentin (NEURONTIN) capsule 300 mg  300 mg Oral TID Hessie Knows, MD   300 mg at 02/15/21 2215   HYDROcodone-acetaminophen (NORCO) 7.5-325 MG per tablet 1-2 tablet  1-2 tablet Oral Q4H PRN Hessie Knows, MD   1 tablet at 02/15/21 1544   HYDROcodone-acetaminophen (NORCO/VICODIN) 5-325 MG per tablet 1-2 tablet  1-2 tablet Oral Q4H PRN Hessie Knows, MD   2 tablet at 02/15/21 1942   menthol-cetylpyridinium (CEPACOL) lozenge 3 mg  1 lozenge Oral PRN Hessie Knows, MD       Or   phenol (CHLORASEPTIC) mouth spray 1  spray  1 spray Mouth/Throat PRN Hessie Knows, MD       meperidine (DEMEROL) injection 25 mg  25 mg Intravenous Q2H PRN Hessie Knows, MD   25 mg at 02/15/21 1118   metoCLOPramide (REGLAN) tablet 5-10 mg  5-10 mg Oral Q8H PRN Hessie Knows, MD       Or   metoCLOPramide (REGLAN) injection 5-10 mg  5-10 mg Intravenous Q8H PRN Hessie Knows, MD       multivitamin with minerals tablet 1 tablet  1 tablet Oral q morning Hessie Knows, MD       ondansetron [facility]) tablet 4 mg  4 mg Oral Q6H PRN Hessie Knows, MD       Or   ondansetron [facility]) injection 4 mg  4 mg Intravenous Q6H PRN Hessie Knows, MD       pantoprazole (PROTONIX) EC tablet 80 mg  80 mg Oral Daily Hessie Knows, MD       polyvinyl alcohol (LIQUIFILM TEARS) 1.4 % ophthalmic solution 1 drop  1 drop Both Eyes Daily PRN Hessie Knows, MD       pravastatin (PRAVACHOL) tablet 40 mg  40 mg Oral QHS Hessie Knows, MD   40 mg at 02/15/21 2215   senna-docusate (Senokot-S) tablet 1 tablet  1 tablet Oral QHS PRN Hessie Knows, MD       sodium phosphate (FLEET) 7-19 GM/118ML enema 1 enema  1 enema Rectal Once PRN Hessie Knows, MD       traMADol Veatrice Bourbon) tablet 50 mg  50 mg Oral Q6H Hessie Knows, MD   50 mg at 02/16/21 8786   triamcinolone cream (KENALOG) 0.1 % cream 1 application  1 application Topical BID PRN Hessie Knows, MD       zolpidem Lorrin Mais) tablet 5 mg  5 mg Oral QHS PRN Hessie Knows, MD   5 mg at 02/15/21 2215     Discharge Medications: Please see discharge summary for a list of discharge medications.  Relevant Imaging Results:  Relevant Lab Results:   Additional Information SS# [id number]  Conception Oms, RN

## 2021-02-16 NOTE — Progress Notes (Signed)
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    PT Comments    Upon arrival OT was assisting pt back to bed from sitting up in chair. Pt required extensive assist and repeated verbal/tactile cues to complete tasks due to lethargy, possible from pain meds. Once pt positioned in supine, she fell asleep immediately. PROM attempted for R knee, pt still resistant due to pain/grimacing. Pt properly positioned in bed with polar care and SCD's reapplied, towel roll to facilitate knee extension. Will attempt again this pm.   Recommendations for follow up therapy are one component of a multi-disciplinary discharge planning process, led by the attending physician.  Recommendations may be updated based on patient status, additional functional criteria and insurance authorization.  Follow Up Recommendations  Skilled nursing-short term rehab (<3 hours/day)     Assistance Recommended at Discharge Set up Supervision/Assistance  Patient can return home with the following A little help with walking and/or transfers   Equipment Recommendations  None recommended by PT    Recommendations for Other Services       Precautions / Restrictions Precautions Precautions: Fall;Knee Precaution Booklet Issued: No Restrictions Weight Bearing Restrictions: Yes RLE Weight Bearing: Weight bearing as tolerated     Mobility  Bed Mobility Overal bed mobility: Needs Assistance Bed Mobility: Sit to Supine     Supine to sit: Max assist     General bed mobility comments:  (Poor ability to follow commands this am)    Transfers                   General transfer comment: OT assisted with transfer upon arrival    Ambulation/Gait               General Gait Details: Unable to tolerate gait this am due to lethargy and high fall  risk   Stairs             Wheelchair Mobility    Modified Rankin (Stroke Patients Only)       Balance Overall balance assessment: Needs assistance Sitting-balance support: Feet supported Sitting balance-Leahy Scale: Fair     Standing balance support: Bilateral upper extremity supported Standing balance-Leahy Scale: Poor                              Cognition Arousal/Alertness: Lethargic, Suspect due to medications Behavior During Therapy:  (Unable to maintain alertness) Overall Cognitive Status: Difficult to assess                                 General Comments:  (Pt very lethargic unable to participate in active tasks due to falling asleep)        Exercises Total Joint Exercises Ankle Circles/Pumps: PROM, Right, 20 reps, Supine Heel Slides: Right, 10 reps, Supine, PROM Goniometric ROM: R knee AAROM 5-50 Other Exercises Other Exercises: Pt grimmacing while asleep with gentle R knee PROM, resistive to movement    General Comments General comments (skin integrity, edema, etc.): Pt's son stated she is very sensitive to pain meds      Pertinent Vitals/Pain Pain Assessment Pain Assessment: Faces Faces Pain Scale: Hurts even more Pain Descriptors / Indicators: Grimacing, Moaning, Operative site guarding Pain Intervention(s): Monitored during session,  Limited activity within patient's tolerance, Ice applied    Home Living Family/patient expects to be discharged to:: Private residence Living Arrangements: Alone Available Help at Discharge: Family;Friend(s);Available PRN/intermittently Type of Home: House Home Access: Level entry       Home Layout: One level Home Equipment: BSC/3in1;Grab bars - toilet;Shower Land (2 wheels);Rollator (4 wheels);Adaptive equipment Additional Comments: if pt goes home, she is able to hire caregivers to assist when son is working    Prior Function            PT Goals (current  goals can now be found in the care plan section) Acute Rehab PT Goals Patient Stated Goal: to get stronger    Frequency    BID      PT Plan      Co-evaluation              AM-PAC PT "6 Clicks" Mobility   Outcome Measure  Help needed turning from your back to your side while in a flat bed without using bedrails?: A Lot Help needed moving from lying on your back to sitting on the side of a flat bed without using bedrails?: A Lot Help needed moving to and from a bed to a chair (including a wheelchair)?: A Lot Help needed standing up from a chair using your arms (e.g., wheelchair or bedside chair)?: A Lot Help needed to walk in hospital room?: A Lot Help needed climbing 3-5 steps with a railing? : A Lot 6 Click Score: 12    End of Session Equipment Utilized During Treatment: Oxygen (2L O2, sats 93% at rest) Activity Tolerance: Patient limited by lethargy;Patient limited by pain Patient left: in bed;with call bell/phone within reach;with bed alarm set;with family/visitor present;with SCD's reapplied Nurse Communication: Mobility status PT Visit Diagnosis: Muscle weakness (generalized) (M62.81);Difficulty in walking, not elsewhere classified (R26.2);Pain Pain - Right/Left: Right Pain - part of body: Knee     Time: 8657-8469 PT Time Calculation (min) (ACUTE ONLY): 20 min  Charges:  $Therapeutic Activity: 8-22 mins                    Mikel Cella, PTA    [name] 02/16/2021, 12:29 PM

## 2021-02-16 NOTE — Progress Notes (Signed)
° °  Subjective: 1 Day Post-Op Procedure(s) (LRB): TOTAL KNEE REVISION (Right) Patient reports pain as mild.   Patient is well, and has had no acute complaints or problems Denies any CP, SOB, ABD pain. We will continue therapy today.  Plan is to go Skilled nursing facility after hospital stay.  Objective: Vital signs in last 24 hours: Temp:  [97.2 F (36.2 C)-98.4 F (36.9 C)] 97.6 F (36.4 C) (01/27 0430) Pulse Rate:  [61-105] 77 (01/27 0430) Resp:  [10-20] 18 (01/27 0430) BP: (94-124)/(40-90) 108/49 (01/27 0430) SpO2:  [90 %-100 %] 93 % (01/27 0430)  Intake/Output from previous day: 01/26 0701 - 01/27 0700 In: 3240 [P.O.:765; I.V.:2000; IV Piggyback:200] Out: 295 [Urine:245; Blood:50] Intake/Output this shift: No intake/output data recorded.  Recent Labs    02/16/21 0606  HGB 7.9*   Recent Labs    02/16/21 0606  WBC 9.5  RBC 2.56*  HCT 25.3*  PLT 212   Recent Labs    02/16/21 0606  NA 133*  K 4.2  CL 103  CO2 23  BUN 26*  CREATININE 1.54*  GLUCOSE 121*  CALCIUM 8.3*   No results for input(s): LABPT, INR in the last 72 hours.  EXAM General - Patient is Alert, Appropriate, and Oriented Extremity - Neurovascular intact Sensation intact distally Intact pulses distally Dorsiflexion/Plantar flexion intact Dressing - dressing C/D/I and no drainage, provena intact with out drainage Motor Function - intact, moving foot and toes well on exam.   Past Medical History:  Diagnosis Date   Anxiety    Aortic atherosclerosis (HCC)    Arthritis    Current use of long term anticoagulation    APIXABAN   Dysrhythmia    A-fib   GERD (gastroesophageal reflux disease)    History of 2019 novel coronavirus disease (COVID-19) 07/2019   History of kidney stones    Hypercholesteremia    Hypertension    IDA (iron deficiency anemia)    Paroxysmal atrial fibrillation (HCC)    Skin cancer    Skin - squamous    Assessment/Plan:   1 Day Post-Op Procedure(s)  (LRB): TOTAL KNEE REVISION (Right) Principal Problem:   S/P revision of total knee, right  Estimated body mass index is 32.3 kg/m as calculated from the following:   Height as of 06/16/20: 5\' 2"  (1.575 m).   Weight as of 06/16/20: 80.1 kg. Advance diet Up with therapy Work on BM VSS, BP soft. Hold Norvasc today. Patient asymptomatic Acute post op blood loss anemia - Hgb 7.9, start Iron supplement. Recheck labs in the am CM to assist with discharge to SNF   DVT Prophylaxis - TED hose and SCDs, Eliquis Weight-Bearing as tolerated to right leg   T. Rachelle Hora, PA-C [place] 02/16/2021, 8:21 AM

## 2021-02-16 NOTE — Evaluation (Signed)
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   Clinical Impression   [name] was seen for OT evaluation this date. Prior to hospital admission, pt was MOD I for mobility and ADLs. Pt lives alone with family/friends available as needed. Pt presents to acute OT demonstrating impaired ADL performance and functional mobility 2/2 decreased activity tolerance, poor insight in to deficits, and functional strength/ROM/balance deficits.   Pt currently requires MIN A exit R side of bed. MOD A don B socks seated EOB. SpO2 93% on 2L [place] seated at rest, trialed room air during seated dressing and desat 85%, resolved on 2L [place]. MOD A + RW sit<>stand and ~5 steps in room. Pt demonstrates poor insight into deficits, requesting to complete grooming standing however unable to tolerate. SETUP + SUPERVISION grooming sitting EOC. Improves to MIN A rising from chair however returned at RN request to assist back to bed as pt fatigued/suspect pain medication increasing lethargy requires MOD A for SPT. Pt would benefit from skilled OT to address noted impairments and functional limitations (see below for any additional details). Upon hospital discharge, recommend STR to maximize pt safety and return to PLOF.      Recommendations for follow up therapy are one component of a multi-disciplinary discharge planning process, led by the attending physician.  Recommendations may be updated based on patient status, additional functional criteria and insurance authorization.   Follow Up Recommendations  Skilled nursing-short term rehab (<3 hours/day)    Assistance Recommended at Discharge Frequent or constant Supervision/Assistance  Patient can return home with the following A lot of help with walking and/or transfers;A lot of help with  bathing/dressing/bathroom;Assistance with cooking/housework;Help with stairs or ramp for entrance    Functional Status Assessment  Patient has had a recent decline in their functional status and demonstrates the ability to make significant improvements in function in a reasonable and predictable amount of time.  Equipment Recommendations  BSC/3in1    Recommendations for Other Services       Precautions / Restrictions Precautions Precautions: Fall;Knee Precaution Booklet Issued: No Restrictions Weight Bearing Restrictions: Yes RLE Weight Bearing: Weight bearing as tolerated      Mobility Bed Mobility Overal bed mobility: Needs Assistance Bed Mobility: Supine to Sit     Supine to sit: Min assist     General bed mobility comments: increased time    Transfers Overall transfer level: Needs assistance Equipment used: Rolling walker (2 wheels) Transfers: Sit to/from Stand, Bed to chair/wheelchair/BSC Sit to Stand: Mod assist     Step pivot transfers: Min assist     General transfer comment: MOD A rising from bed improving to MIN A rising from chair however 2nd trial from chair requires MOD A as pt fatigued/suspect pain medication increasing lethargy      Balance Overall balance assessment: Needs assistance Sitting-balance support: Feet supported, Bilateral upper extremity supported Sitting balance-Leahy Scale: Fair     Standing balance support: Bilateral upper extremity supported, Reliant on assistive device for balance Standing balance-Leahy Scale: Poor                             ADL either performed or assessed with clinical judgement   ADL Overall ADL's : Needs assistance/impaired  General ADL Comments: MOD A don B socks seated EOB. MOD A + RW for simulated BSC t/f. SETUP + SUPERVISION grooming sitting EOB.      Pertinent Vitals/Pain Pain Assessment Pain Assessment: 0-10 Pain Score: 7   Pain Location: R knee Pain Descriptors / Indicators: Discomfort, Dull, Grimacing Pain Intervention(s): Limited activity within patient's tolerance, Repositioned, RN gave pain meds during session     Hand Dominance     Extremity/Trunk Assessment Upper Extremity Assessment Upper Extremity Assessment: Overall WFL for tasks assessed   Lower Extremity Assessment Lower Extremity Assessment: Generalized weakness       Communication Communication Communication: No difficulties   Cognition Arousal/Alertness: Awake/alert Behavior During Therapy: WFL for tasks assessed/performed Overall Cognitive Status: Within Functional Limits for tasks assessed                                       General Comments  Pt's son stated she is very sensitive to pain meds     Home Living Family/patient expects to be discharged to:: Private residence Living Arrangements: Alone Available Help at Discharge: Family;Friend(s);Available PRN/intermittently Type of Home: House Home Access: Level entry     Home Layout: One level     Bathroom Shower/Tub: Walk-in shower         Home Equipment: BSC/3in1;Grab bars - toilet;Shower Land (2 wheels);Rollator (4 wheels);Adaptive equipment   Additional Comments: if pt goes home, she is able to hire caregivers to assist when son is working      Prior Functioning/Environment Prior Level of Function : Independent/Modified Independent;Driving             Mobility Comments: using rollator for all mobility, very indep          OT Problem List: Decreased strength;Decreased range of motion;Decreased activity tolerance;Impaired balance (sitting and/or standing);Decreased safety awareness;Decreased knowledge of use of DME or AE      OT Treatment/Interventions: Self-care/ADL training;Therapeutic exercise;Energy conservation;DME and/or AE instruction;Therapeutic activities;Patient/family education;Balance training    OT  Goals(Current goals can be found in the care plan section) Acute Rehab OT Goals Patient Stated Goal: to go home OT Goal Formulation: With patient/family Time For Goal Achievement: 03/02/21 Potential to Achieve Goals: Fair ADL Goals Pt Will Perform Grooming: with set-up;with supervision;sitting Pt Will Perform Lower Body Dressing: with min assist;sit to/from stand Pt Will Transfer to Toilet: with supervision;ambulating;regular height toilet  OT Frequency: Min 2X/week    Co-evaluation              AM-PAC OT "6 Clicks" Daily Activity     Outcome Measure Help from another person eating meals?: A Little Help from another person taking care of personal grooming?: A Little Help from another person toileting, which includes using toliet, bedpan, or urinal?: A Lot Help from another person bathing (including washing, rinsing, drying)?: A Lot Help from another person to put on and taking off regular upper body clothing?: A Little Help from another person to put on and taking off regular lower body clothing?: A Lot 6 Click Score: 15   End of Session Equipment Utilized During Treatment: Rolling walker (2 wheels);Gait belt Nurse Communication: Mobility status  Activity Tolerance: Patient tolerated treatment well Patient left: in chair;with call bell/phone within reach;with chair alarm set;with nursing/sitter in room;with family/visitor present  OT Visit Diagnosis: Other abnormalities of gait and mobility (R26.89);Muscle weakness (generalized) (M62.81)  Time: 1674-2552 (5894-8347) OT Time Calculation (min): 62 min Charges:  OT General Charges $OT Visit: 1 Visit OT Evaluation $OT Eval Low Complexity: 1 Low OT Treatments $Self Care/Home Management : 53-67 mins  Dessie Coma, M.S. OTR/L  02/16/21, 12:44 PM  ascom [phone]

## 2021-02-16 NOTE — Discharge Instructions (Signed)

## 2021-02-17 LAB — URINALYSIS, COMPLETE (UACMP) WITH MICROSCOPIC
Bilirubin Urine: NEGATIVE
Glucose, UA: NEGATIVE mg/dL
Ketones, ur: NEGATIVE mg/dL
Leukocytes,Ua: NEGATIVE
Nitrite: NEGATIVE
Protein, ur: NEGATIVE mg/dL
Specific Gravity, Urine: <1.005 — ABNORMAL LOW (ref 1.005–1.030)
pH: 5 (ref 5.0–8.0)

## 2021-02-17 LAB — BASIC METABOLIC PANEL
Anion gap: 7 (ref 5–15)
BUN: 28 mg/dL — ABNORMAL HIGH (ref 8–23)
CO2: 24 mmol/L (ref 22–32)
Calcium: 8.9 mg/dL (ref 8.9–10.3)
Chloride: 103 mmol/L (ref 98–111)
Creatinine, Ser: 1.3 mg/dL — ABNORMAL HIGH (ref 0.44–1.00)
GFR, Estimated: 41 mL/min — ABNORMAL LOW (ref >60)
Glucose, Bld: 118 mg/dL — ABNORMAL HIGH (ref 70–99)
Potassium: 4.3 mmol/L (ref 3.5–5.1)
Sodium: 134 mmol/L — ABNORMAL LOW (ref 135–145)

## 2021-02-17 LAB — CBC
HCT: 22.9 % — ABNORMAL LOW (ref 36.0–46.0)
Hemoglobin: 7.3 g/dL — ABNORMAL LOW (ref 12.0–15.0)
MCH: 30.4 pg (ref 26.0–34.0)
MCHC: 31.9 g/dL (ref 30.0–36.0)
MCV: 95.4 fL (ref 80.0–100.0)
Platelets: 191 10*3/uL (ref 150–400)
RBC: 2.4 MIL/uL — ABNORMAL LOW (ref 3.87–5.11)
RDW: 12.7 % (ref 11.5–15.5)
WBC: 11.5 10*3/uL — ABNORMAL HIGH (ref 4.0–10.5)
nRBC: 0 % (ref 0.0–0.2)

## 2021-02-17 MED ORDER — ALPRAZOLAM 0.25 MG PO TABS
0.2500 mg | ORAL_TABLET | Freq: Two times a day (BID) | ORAL | Status: DC | PRN
  Administered 2021-02-17 – 2021-02-20 (×5): 0.25 mg via ORAL
  Filled 2021-02-17 (×5): qty 1

## 2021-02-17 NOTE — Plan of Care (Signed)

## 2021-02-17 NOTE — Progress Notes (Signed)
PT Cancellation Note  Patient Details Name: [name] MRN: [id number] DOB: [birth year]   Cancelled Treatment:     Author returned for 2nd/PM session however pt is very lethargic and continues to have confusion with disorientation. When awake, she was talking to people who were not in the room. PT will continue efforts as able to progress to PLOF. SNF is most appropriate when medically cleared.     Willette Pa 02/17/2021, 4:38 PM

## 2021-02-17 NOTE — Progress Notes (Signed)
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    PT Comments    Pt was sitting in recliner upon arriving with supportive son at bedside. Pt is alert but severely disoriented. Cognition greatly impacts session progression. Per son," She had severe anxiety all night and did not sleep much." Pt is not at baseline cognition. Author has treated pt in past and cognition was not an issue. Today pt is severely limited by cognition/pain. Pain meds were not issued prior to session in hopes cognition will improve. She was able to stand with assistance to RW and ambulate short distance ~ 5 ft prior to pain being" unbearable." And needing to return to sitting. Poor posture in standing however pt can stand upright with constant vcs. Pt did several exercises in chair  prior to conclusion of session. Acute PT will continue efforts to progress pt to PLOF. SNF at [place] is most appropriate due to limited progress currently due to cognition deficits along with pain. Acute PT will continue efforts to assist pt to PLOF.     Recommendations for follow up therapy are one component of a multi-disciplinary discharge planning process, led by the attending physician.  Recommendations may be updated based on patient status, additional functional criteria and insurance authorization.  Follow Up Recommendations  Skilled nursing-short term rehab (<3 hours/day) [facility])     Assistance Recommended at Discharge Frequent or constant Supervision/Assistance  Patient can return home with the following A lot of help with walking and/or transfers;A lot of help with bathing/dressing/bathroom;Assistance with cooking/housework;Assistance with feeding;Direct supervision/assist for medications management;Direct supervision/assist for financial management;Assist for  transportation;Help with stairs or ramp for entrance   Equipment Recommendations  Other (comment) (pt has all equipmwnt needs met)       Precautions / Restrictions Precautions Precautions: Fall;Knee Precaution Booklet Issued: No Restrictions Weight Bearing Restrictions: Yes RLE Weight Bearing: Weight bearing as tolerated     Mobility  Bed Mobility    General bed mobility comments: Pt was in recliner pre/post session    Transfers Overall transfer level: Needs assistance Equipment used: Rolling walker (2 wheels) Transfers: Sit to/from Stand Sit to Stand: Min assist, Mod assist    General transfer comment: Alot of increased time to perform due to cognition. She was able to stand to RW with assistance    Ambulation/Gait Ambulation/Gait assistance: Min assist Gait Distance (Feet): 5 Feet Assistive device: Rolling walker (2 wheels) Gait Pattern/deviations: Step-to pattern, Antalgic, Trunk flexed, Shuffle Gait velocity: extremely slow     General Gait Details: pt has poor gait tolerance due to pain. she took ~ 5 steps fwd then needs to have seated rest. cognition/pain coninues to be main limiting factor. Pt struggles after gait training to stay awake.    Balance Overall balance assessment: Needs assistance Sitting-balance support: Feet supported, Bilateral upper extremity supported Sitting balance-Leahy Scale: Fair     Standing balance support: Bilateral upper extremity supported, Reliant on assistive device for balance Standing balance-Leahy Scale: Fair Standing balance comment: poor standing posture. flexed posture during standing activity       Cognition Arousal/Alertness: Awake/alert Behavior During Therapy: WFL for tasks assessed/performed Overall Cognitive Status: Within Functional Limits for tasks assessed    General Comments: Pt is mostly alert however become lethargic throughout session. per family," She wa sup all night with anxiety." Pt is severely  disorinented. She knows she was in hospital one minute then thinks she is at home the next. Family very concerned about cognition. discussed how medications/anesthesia/ lack of sleep can cause confusion               Pertinent Vitals/Pain Pain Assessment Pain Assessment: 0-10 Pain Score: 9  (during wt bearing activity. O/10 at rest) Pain Location: R knee Pain Descriptors / Indicators: Discomfort, Dull, Grimacing Pain Intervention(s): Limited activity within patient's tolerance, Monitored during session, Premedicated before session, Repositioned, Ice applied     PT Goals (current goals can now be found in the care plan section) Acute Rehab PT Goals Patient Stated Goal: none stated Progress towards PT goals: Not progressing toward goals - comment (cognition has been limiting PT progress)    Frequency    BID      PT Plan Current plan remains appropriate       AM-PAC PT "6 Clicks" Mobility   Outcome Measure  Help needed turning from your back to your side while in a flat bed without using bedrails?: A Lot Help needed moving from lying on your back to sitting on the side of a flat bed without using bedrails?: A Lot Help needed moving to and from a bed to a chair (including a wheelchair)?: A Lot Help needed standing up from a chair using your arms (e.g., wheelchair or bedside chair)?: A Lot Help needed to walk in hospital room?: A Lot Help needed climbing 3-5 steps with a railing? : A Lot 6 Click Score: 12    End of Session Equipment Utilized During Treatment: Oxygen (removed throughout session however did desaturate to 80 (author questions reliability) during gait training. RN made aware) Activity Tolerance: Patient tolerated treatment well;Other (comment) (session severely limited by cognition/pain) Patient left: in chair;with call bell/phone within reach;with chair alarm set;with family/visitor present Nurse Communication: Mobility status PT Visit Diagnosis: Muscle  weakness (generalized) (M62.81);Difficulty in walking, not elsewhere classified (R26.2);Pain Pain - Right/Left: Right Pain - part of body: Knee     Time: 0370-4888 PT Time Calculation (min) (ACUTE ONLY): 18 min  Charges:  $Therapeutic Activity: 8-22 mins                    Julaine Fusi PTA 02/17/21, 4:36 PM

## 2021-02-17 NOTE — Progress Notes (Signed)
°  Subjective: 2 Days Post-Op Procedure(s) (LRB): TOTAL KNEE REVISION (Right) Patient reports pain as moderate.   Patient is well, and has had no acute complaints or problems.  Slept better.  Less confused. Plan is to go Rehab after hospital stay. Negative for chest pain and shortness of breath Fever: Low-grade 99.6 Gastrointestinal: Negative for nausea and vomiting  Objective: Vital signs in last 24 hours: Temp:  [97.6 F (36.4 C)-99.6 F (37.6 C)] 99.6 F (37.6 C) (01/28 0244) Pulse Rate:  [73-82] 76 (01/28 0601) Resp:  [15-18] 18 (01/28 0244) BP: (114-152)/(45-64) 135/51 (01/28 0601) SpO2:  [90 %-96 %] 92 % (01/28 0244) Weight:  [75.3 kg] 75.3 kg (01/27 1000)  Intake/Output from previous day:  Intake/Output Summary (Last 24 hours) at 02/17/2021 0730 Last data filed at 02/17/2021 0618 Gross per 24 hour  Intake 1820 ml  Output 520 ml  Net 1300 ml    Intake/Output this shift: No intake/output data recorded.  Labs: Recent Labs    02/16/21 0606 02/17/21 0451  HGB 7.9* 7.3*   Recent Labs    02/16/21 0606 02/17/21 0451  WBC 9.5 11.5*  RBC 2.56* 2.40*  HCT 25.3* 22.9*  PLT 212 191   Recent Labs    02/16/21 0606 02/17/21 0451  NA 133* 134*  K 4.2 4.3  CL 103 103  CO2 23 24  BUN 26* 28*  CREATININE 1.54* 1.30*  GLUCOSE 121* 118*  CALCIUM 8.3* 8.9   No results for input(s): LABPT, INR in the last 72 hours.   EXAM General - Patient is Alert and Oriented Extremity - Neurovascular intact Sensation intact distally Dorsiflexion/Plantar flexion intact Compartment soft Dressing/Incision - clean, dry, with the wound VAC in place Motor Function - intact, moving foot and toes well on exam.   Past Medical History:  Diagnosis Date   Anxiety    Aortic atherosclerosis (HCC)    Arthritis    Current use of long term anticoagulation    APIXABAN   Dysrhythmia    A-fib   GERD (gastroesophageal reflux disease)    History of 2019 novel coronavirus disease  (COVID-19) 07/2019   History of kidney stones    Hypercholesteremia    Hypertension    IDA (iron deficiency anemia)    Paroxysmal atrial fibrillation (HCC)    Skin cancer    Skin - squamous    Assessment/Plan: 2 Days Post-Op Procedure(s) (LRB): TOTAL KNEE REVISION (Right) Principal Problem:   S/P revision of total knee, right  Estimated body mass index is 30.36 kg/m as calculated from the following:   Height as of this encounter: 5\' 2"  (1.575 m).   Weight as of this encounter: 75.3 kg. Advance diet Up with therapy D/C IV fluids  DVT Prophylaxis -TED hose, compression stockings, Eliquis Weight-Bearing as tolerated to right leg  Reche Dixon, PA-C Orthopaedic Surgery 02/17/2021, 7:30 AM

## 2021-02-17 NOTE — Progress Notes (Addendum)
°   02/17/21 1110  Clinical Encounter Type  Visited With Patient and family together  Visit Type Initial;Spiritual support;Social support  Referral From Nurse  Consult/Referral To [place] (Comment) (Advanced Directive)   Chaplain Burris met with Pt and her son, [name]. Chaplain B offered support to Pt and inquired about her well-being and got to know that she has been a long-time member of her faith community, [employer]. We discussed elements of the advanced directive and acknowledged that it could not be completed at this time due to pain meds. Chaplain B discussed with [name] some additional concerns about medical history of family and mental status changes in his mom. Chaplain B offered normalization of emotions, encouraged staying present in the moment, but also supported the need to prepare for  changes. Will continue to follow and offer support.

## 2021-02-17 NOTE — Progress Notes (Signed)
NA and RN found patient upset and anxious with blood on bed and floor. Patient removed IV and wound vac found to be peeling and leaking. IV site cleaned and intact. Wound vac reinforced with additional dressing, seal check complete and suction resumed. Patient reassured. Family at bedside now. Patient up to [facility] w/ 2X assist and now resting in chair comfortably. Will continue to monitor

## 2021-02-17 NOTE — Progress Notes (Signed)
Patient with increased anxiety, agitation, and confusion throughout the day. Only pain medication received is tylenol. PA [employer] notified, prn anti-anxiety med ordered. Patient in bed with son at bedside. Call bell within reach. Bed alarm active. Will continue to monitor

## 2021-02-18 LAB — HEMOGLOBIN AND HEMATOCRIT, BLOOD
HCT: 21.1 % — ABNORMAL LOW (ref 36.0–46.0)
HCT: 25.3 % — ABNORMAL LOW (ref 36.0–46.0)
Hemoglobin: 6.8 g/dL — ABNORMAL LOW (ref 12.0–15.0)
Hemoglobin: 8.6 g/dL — ABNORMAL LOW (ref 12.0–15.0)

## 2021-02-18 LAB — PREPARE RBC (CROSSMATCH)

## 2021-02-18 MED ORDER — SODIUM CHLORIDE 0.9% IV SOLUTION: Freq: Once | INTRAVENOUS | Status: DC

## 2021-02-18 NOTE — Progress Notes (Signed)
Physical Therapy Treatment Patient Details Name: [name] MRN: [id number] DOB: 1940-06-21 Today's Date: 02/18/2021   History of Present Illness Pt admitted for R TKR revision. History of initial TKR in 2012.    PT Comments    Awake today but remains confused.  Participated in exercises as described below.  She is able to dangle at EOB x 5 minutes with slow gentle stretching R knee flexion but seems to self limit due to cognition and pain.she does stand and take 3 small sidesteps along EOB but with poor quality steps and increased cues and time.  Returns to supine with mod a x 1.  Pt is awaiting blood transfusion due to HgB 7.3 this am so she is returned to bed for nursing interventions.   Recommendations for follow up therapy are one component of a multi-disciplinary discharge planning process, led by the attending physician.  Recommendations may be updated based on patient status, additional functional criteria and insurance authorization.  Follow Up Recommendations  Skilled nursing-short term rehab (<3 hours/day)     Assistance Recommended at Discharge Frequent or constant Supervision/Assistance  Patient can return home with the following A lot of help with walking and/or transfers;A lot of help with bathing/dressing/bathroom;Assistance with cooking/housework;Assistance with feeding;Direct supervision/assist for medications management;Direct supervision/assist for financial management;Assist for transportation;Help with stairs or ramp for entrance   Equipment Recommendations       Recommendations for Other Services       Precautions / Restrictions Precautions Precautions: Fall;Knee Precaution Booklet Issued: No Restrictions Weight Bearing Restrictions: Yes RLE Weight Bearing: Weight bearing as tolerated     Mobility  Bed Mobility Overal bed mobility: Needs Assistance Bed Mobility: Supine to Sit, Sit to Supine     Supine to sit: Min assist Sit to supine: Mod assist         Transfers Overall transfer level: Needs assistance Equipment used: Rolling walker (2 wheels) Transfers: Sit to/from Stand Sit to Stand: Min assist, Mod assist                Ambulation/Gait Ambulation/Gait assistance: Min assist Gait Distance (Feet): 3 Feet Assistive device: Rolling walker (2 wheels) Gait Pattern/deviations: Step-to pattern Gait velocity: extremely slow         Stairs             Wheelchair Mobility    Modified Rankin (Stroke Patients Only)       Balance Overall balance assessment: Needs assistance Sitting-balance support: Feet supported, Bilateral upper extremity supported Sitting balance-Leahy Scale: Good     Standing balance support: Bilateral upper extremity supported, Reliant on assistive device for balance Standing balance-Leahy Scale: Fair Standing balance comment: poor standing posture. flexed posture during standing activity                            Cognition Arousal/Alertness: Awake/alert Behavior During Therapy: WFL for tasks assessed/performed Overall Cognitive Status: Impaired/Different from baseline Area of Impairment: Orientation, Memory, Awareness, Safety/judgement                               General Comments: confused and seems to be looking for people in the room and suprised they are not there.  awake and engaged today for session        Exercises Total Joint Exercises Goniometric ROM: 5-50 - seems to self limite due to pain/cognition Other Exercises Other Exercises: supine and seated  AAROM  x 10 for RLE per TKR protocol    General Comments        Pertinent Vitals/Pain Pain Assessment Pain Assessment: Faces Faces Pain Scale: Hurts even more Pain Location: R knee with mobility and felxion/ROM Pain Descriptors / Indicators: Discomfort, Dull, Grimacing Pain Intervention(s): Limited activity within patient's tolerance, Monitored during session, Repositioned, Ice applied     Home Living                          Prior Function            PT Goals (current goals can now be found in the care plan section) Progress towards PT goals: Progressing toward goals    Frequency    BID      PT Plan Current plan remains appropriate    Co-evaluation              AM-PAC PT "6 Clicks" Mobility   Outcome Measure  Help needed turning from your back to your side while in a flat bed without using bedrails?: A Lot Help needed moving from lying on your back to sitting on the side of a flat bed without using bedrails?: A Little Help needed moving to and from a bed to a chair (including a wheelchair)?: A Lot Help needed standing up from a chair using your arms (e.g., wheelchair or bedside chair)?: A Lot Help needed to walk in hospital room?: A Lot Help needed climbing 3-5 steps with a railing? : Total 6 Click Score: 12    End of Session Equipment Utilized During Treatment: Gait belt Activity Tolerance: Patient tolerated treatment well Patient left: in bed;with call bell/phone within reach;with bed alarm set;with family/visitor present Nurse Communication: Mobility status PT Visit Diagnosis: Muscle weakness (generalized) (M62.81);Difficulty in walking, not elsewhere classified (R26.2);Pain Pain - Right/Left: Right Pain - part of body: Knee     Time: 1005-1030 PT Time Calculation (min) (ACUTE ONLY): 25 min  Charges:  $Therapeutic Exercise: 8-22 mins $Therapeutic Activity: 8-22 mins                    Chesley Noon, PTA 02/18/21, 11:05 AM

## 2021-02-18 NOTE — Progress Notes (Signed)
°  Subjective: 3 Days Post-Op Procedure(s) (LRB): TOTAL KNEE REVISION (Right) Patient reports pain as mild to moderate.   Patient is well, and has had no acute complaints or problems.  Slept better.  Still confused. Plan is to go Rehab after hospital stay. Negative for chest pain and shortness of breath Fever: No Gastrointestinal: Negative for nausea and vomiting  Objective: Vital signs in last 24 hours: Temp:  [97.7 F (36.5 C)-98.5 F (36.9 C)] 98.3 F (36.8 C) (01/29 5625) Pulse Rate:  [66-105] 77 (01/29 0608) Resp:  [15-20] 20 (01/29 0608) BP: (122-143)/(39-81) 143/48 (01/29 0608) SpO2:  [91 %-100 %] 97 % (01/29 0608)  Intake/Output from previous day:  Intake/Output Summary (Last 24 hours) at 02/18/2021 0657 Last data filed at 02/17/2021 1936 Gross per 24 hour  Intake 120 ml  Output 250 ml  Net -130 ml    Intake/Output this shift: Total I/O In: -  Out: 250 [Urine:250]  Labs: Recent Labs    02/16/21 0606 02/17/21 0451  HGB 7.9* 7.3*   Recent Labs    02/16/21 0606 02/17/21 0451  WBC 9.5 11.5*  RBC 2.56* 2.40*  HCT 25.3* 22.9*  PLT 212 191   Recent Labs    02/16/21 0606 02/17/21 0451  NA 133* 134*  K 4.2 4.3  CL 103 103  CO2 23 24  BUN 26* 28*  CREATININE 1.54* 1.30*  GLUCOSE 121* 118*  CALCIUM 8.3* 8.9   No results for input(s): LABPT, INR in the last 72 hours.   EXAM General - Patient is Alert and Oriented Extremity - Neurovascular intact Sensation intact distally Dorsiflexion/Plantar flexion intact Compartment soft Dressing/Incision - clean, dry, with the wound VAC in place Motor Function - intact, moving foot and toes well on exam.   Past Medical History:  Diagnosis Date   Anxiety    Aortic atherosclerosis (HCC)    Arthritis    Current use of long term anticoagulation    APIXABAN   Dysrhythmia    A-fib   GERD (gastroesophageal reflux disease)    History of 2019 novel coronavirus disease (COVID-19) 07/2019   History of kidney  stones    Hypercholesteremia    Hypertension    IDA (iron deficiency anemia)    Paroxysmal atrial fibrillation (HCC)    Skin cancer    Skin - squamous    Assessment/Plan: 3 Days Post-Op Procedure(s) (LRB): TOTAL KNEE REVISION (Right) Principal Problem:   S/P revision of total knee, right  Estimated body mass index is 30.36 kg/m as calculated from the following:   Height as of this encounter: 5\' 2"  (1.575 m).   Weight as of this encounter: 75.3 kg. Advance diet Up with therapy D/C IV fluids  Acute blood loss anemia.  Post surgery.  Hemoglobin 7.3 from 7.9 yesterday.  Transfuse 1 unit of packed red blood cells.  Recheck labs.  Confusion.  UA normal.  Limiting narcotics.  DVT Prophylaxis -TED hose, compression stockings, Eliquis Weight-Bearing as tolerated to right leg  Reche Dixon, PA-C Orthopaedic Surgery 02/18/2021, 6:57 AM

## 2021-02-19 LAB — BPAM RBC
Blood Product Expiration Date: 202302222359
ISSUE DATE / TIME: 202301291102
Unit Type and Rh: 6200

## 2021-02-19 LAB — RESP PANEL BY RT-PCR (FLU A&B, COVID) ARPGX2
Influenza A by PCR: NEGATIVE
Influenza B by PCR: NEGATIVE
SARS Coronavirus 2 by RT PCR: NEGATIVE

## 2021-02-19 LAB — TYPE AND SCREEN
ABO/RH(D): A POS
Antibody Screen: NEGATIVE
Unit division: 0

## 2021-02-19 LAB — CBC
HCT: 25.5 % — ABNORMAL LOW (ref 36.0–46.0)
Hemoglobin: 8.6 g/dL — ABNORMAL LOW (ref 12.0–15.0)
MCH: 31.6 pg (ref 26.0–34.0)
MCHC: 33.7 g/dL (ref 30.0–36.0)
MCV: 93.8 fL (ref 80.0–100.0)
Platelets: 239 10*3/uL (ref 150–400)
RBC: 2.72 MIL/uL — ABNORMAL LOW (ref 3.87–5.11)
RDW: 13.8 % (ref 11.5–15.5)
WBC: 11.1 10*3/uL — ABNORMAL HIGH (ref 4.0–10.5)
nRBC: 0 % (ref 0.0–0.2)

## 2021-02-19 NOTE — Progress Notes (Signed)
Physical Therapy Treatment Patient Details Name: [name] MRN: [id number] DOB: Aug 29, 1940 Today's Date: 02/19/2021   History of Present Illness Pt admitted for R TKR revision. History of initial TKR in 2012.    PT Comments    Ready for session.  More alert today but remains sleepy.  C/o being cold.  Participated in exercises as described below. To EOB with min a x 1 and time.  Steady in sitting.  Mod a x 1 to stand with cues today as she was less interested in moving.  Transferred to chair with mod a x 1.  Gentle stretching but limited by pain and overall motivation today.  Remained in chair for breakfast.  Safety precautions in place.  Family in room.     Recommendations for follow up therapy are one component of a multi-disciplinary discharge planning process, led by the attending physician.  Recommendations may be updated based on patient status, additional functional criteria and insurance authorization.  Follow Up Recommendations  Skilled nursing-short term rehab (<3 hours/day)     Assistance Recommended at Discharge Frequent or constant Supervision/Assistance  Patient can return home with the following A lot of help with walking and/or transfers;A lot of help with bathing/dressing/bathroom;Assistance with cooking/housework;Assistance with feeding;Direct supervision/assist for medications management;Direct supervision/assist for financial management;Assist for transportation;Help with stairs or ramp for entrance   Equipment Recommendations       Recommendations for Other Services       Precautions / Restrictions Precautions Precautions: Fall;Knee Precaution Booklet Issued: No Restrictions Weight Bearing Restrictions: Yes RLE Weight Bearing: Weight bearing as tolerated     Mobility  Bed Mobility Overal bed mobility: Needs Assistance Bed Mobility: Supine to Sit     Supine to sit: Min assist          Transfers Overall transfer level: Needs  assistance Equipment used: Rolling walker (2 wheels) Transfers: Sit to/from Stand Sit to Stand: Mod assist                Ambulation/Gait Ambulation/Gait assistance: Min assist Gait Distance (Feet): 3 Feet Assistive device: Rolling walker (2 wheels) Gait Pattern/deviations: Step-to pattern Gait velocity: slow     General Gait Details: poor steps to chair but follow cues for safety   Stairs             Wheelchair Mobility    Modified Rankin (Stroke Patients Only)       Balance Overall balance assessment: Needs assistance Sitting-balance support: Feet supported, Bilateral upper extremity supported Sitting balance-Leahy Scale: Good     Standing balance support: Bilateral upper extremity supported, Reliant on assistive device for balance Standing balance-Leahy Scale: Fair Standing balance comment: poor standing posture. flexed posture during standing activity                            Cognition Arousal/Alertness: Awake/alert Behavior During Therapy: WFL for tasks assessed/performed Overall Cognitive Status: Impaired/Different from baseline                                 General Comments: somewhat less confused today but generally sleepy and needs much encouragement to move today        Exercises Total Joint Exercises Ankle Circles/Pumps: PROM, Right, 20 reps, Supine Heel Slides: Right, 10 reps, Supine, AAROM Hip ABduction/ADduction: AAROM, Supine, Right, 10 reps Straight Leg Raises: AAROM, Supine, Right, 10 reps Long Arc Quad: AAROM,  Seated, 5 reps Knee Flexion: AAROM, Seated, 5 reps Goniometric ROM: 5-45    General Comments        Pertinent Vitals/Pain Pain Assessment Pain Assessment: Faces Faces Pain Scale: Hurts even more Pain Location: R knee with mobility and felxion/ROM Pain Descriptors / Indicators: Discomfort, Dull, Grimacing Pain Intervention(s): Limited activity within patient's tolerance, Monitored during  session, Repositioned, Ice applied    Home Living                          Prior Function            PT Goals (current goals can now be found in the care plan section) Progress towards PT goals: Progressing toward goals    Frequency    BID      PT Plan Current plan remains appropriate    Co-evaluation              AM-PAC PT "6 Clicks" Mobility   Outcome Measure  Help needed turning from your back to your side while in a flat bed without using bedrails?: A Lot Help needed moving from lying on your back to sitting on the side of a flat bed without using bedrails?: A Little Help needed moving to and from a bed to a chair (including a wheelchair)?: A Lot Help needed standing up from a chair using your arms (e.g., wheelchair or bedside chair)?: A Lot Help needed to walk in hospital room?: A Lot Help needed climbing 3-5 steps with a railing? : Total 6 Click Score: 12    End of Session Equipment Utilized During Treatment: Gait belt Activity Tolerance: Patient tolerated treatment well Patient left: in chair;with call bell/phone within reach;with chair alarm set;with family/visitor present Nurse Communication: Mobility status PT Visit Diagnosis: Muscle weakness (generalized) (M62.81);Difficulty in walking, not elsewhere classified (R26.2);Pain Pain - Right/Left: Right Pain - part of body: Knee     Time: 3875-6433 PT Time Calculation (min) (ACUTE ONLY): 18 min  Charges:  $Therapeutic Exercise: 8-22 mins                    Chesley Noon, PTA 02/19/21, 10:12 AM

## 2021-02-19 NOTE — Care Management Important Message (Signed)
Important Message  Patient Details  Name: [name] MRN: [id number] Date of Birth: 01-Jan-1941   Medicare Important Message Given:  Yes     Juliann Pulse A  02/19/2021, 11:35 AM

## 2021-02-19 NOTE — Progress Notes (Signed)
° °  Subjective: 4 Days Post-Op Procedure(s) (LRB): TOTAL KNEE REVISION (Right) Patient reports pain as mild.   Patient is well, and has had no acute complaints or problems. Delirium reported over weekend. Mental status seems to be improving.  Denies any CP, SOB, ABD pain. We will continue therapy today.  Plan is to go Skilled nursing facility after hospital stay.  Objective: Vital signs in last 24 hours: Temp:  [98.3 F (36.8 C)-99.7 F (37.6 C)] 99.1 F (37.3 C) (01/30 0812) Pulse Rate:  [70-78] 78 (01/30 0812) Resp:  [16-20] 16 (01/30 0812) BP: (149-166)/(49-61) 150/54 (01/30 0812) SpO2:  [91 %-99 %] 95 % (01/30 0812)  Intake/Output from previous day: 01/29 0701 - 01/30 0700 In: 598 [P.O.:120; Blood:478] Out: 850 [Urine:850] Intake/Output this shift: Total I/O In: -  Out: 100 [Urine:100]  Recent Labs    02/17/21 0451 02/18/21 0810 02/18/21 1644 02/19/21 0434  HGB 7.3* 6.8* 8.6* 8.6*   Recent Labs    02/17/21 0451 02/18/21 0810 02/18/21 1644 02/19/21 0434  WBC 11.5*  --   --  11.1*  RBC 2.40*  --   --  2.72*  HCT 22.9*   < > 25.3* 25.5*  PLT 191  --   --  239   < > = values in this interval not displayed.   Recent Labs    02/17/21 0451  NA 134*  K 4.3  CL 103  CO2 24  BUN 28*  CREATININE 1.30*  GLUCOSE 118*  CALCIUM 8.9   No results for input(s): LABPT, INR in the last 72 hours.  EXAM General - Patient is Alert, Appropriate, and Oriented. Slight confusion Extremity - Neurovascular intact Sensation intact distally Intact pulses distally Dorsiflexion/Plantar flexion intact Dressing - dressing C/D/I and no drainage, provena intact with out drainage Motor Function - intact, moving foot and toes well on exam.   Past Medical History:  Diagnosis Date   Anxiety    Aortic atherosclerosis (HCC)    Arthritis    Current use of long term anticoagulation    APIXABAN   Dysrhythmia    A-fib   GERD (gastroesophageal reflux disease)    History of 2019  novel coronavirus disease (COVID-19) 07/2019   History of kidney stones    Hypercholesteremia    Hypertension    IDA (iron deficiency anemia)    Paroxysmal atrial fibrillation (HCC)    Skin cancer    Skin - squamous    Assessment/Plan:   4 Days Post-Op Procedure(s) (LRB): TOTAL KNEE REVISION (Right) Principal Problem:   S/P revision of total knee, right  Estimated body mass index is 30.36 kg/m as calculated from the following:   Height as of this encounter: 5\' 2"  (1.575 m).   Weight as of this encounter: 75.3 kg. Advance diet Up with therapy Work on BM VSS Acute post op blood loss anemia - Hgb 8.6, s/p 1 unit of PRBC Delerium - continue to monitor. Avoid narcotics. Mental status improving. CM to assist with discharge to SNF   DVT Prophylaxis - TED hose and SCDs, Eliquis Weight-Bearing as tolerated to right leg   T. Rachelle Hora, PA-C [place] 02/19/2021, 11:41 AM

## 2021-02-19 NOTE — Progress Notes (Signed)
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    PT Comments    In recliner, stretching in sitting.  Ready to stand.  Stood with mod a x 1 and transfers slowly to bed.  Short seated rest before standing again in attempt to sidestep along bed to position higher.  She struggles and is only able to take step or two higher before sitting.  Requests to return to supine to rest.  Positioned for comfort.  Mentally seems clearer this session remembering my name and that of a student with me.  Joking at times despite fatigue and discomfort but she does have some trouble work finding and she is aware of her difficulty.   Recommendations for follow up therapy are one component of a multi-disciplinary discharge planning process, led by the attending physician.  Recommendations may be updated based on patient status, additional functional criteria and insurance authorization.  Follow Up Recommendations  Skilled nursing-short term rehab (<3 hours/day)     Assistance Recommended at Discharge Frequent or constant Supervision/Assistance  Patient can return home with the following A lot of help with walking and/or transfers;A lot of help with bathing/dressing/bathroom;Assistance with cooking/housework;Assistance with feeding;Direct supervision/assist for medications management;Direct supervision/assist for financial management;Assist for transportation;Help with stairs or ramp for entrance   Equipment Recommendations  Other (comment)    Recommendations for Other Services       Precautions / Restrictions Precautions Precautions: Fall;Knee Precaution Booklet Issued: No Restrictions Weight Bearing Restrictions: Yes RLE Weight Bearing: Weight bearing as tolerated     Mobility  Bed Mobility Overal bed mobility: Needs Assistance Bed  Mobility: Sit to Supine     Supine to sit: Min assist Sit to supine: Mod assist        Transfers Overall transfer level: Needs assistance Equipment used: Rolling walker (2 wheels) Transfers: Sit to/from Stand Sit to Stand: Mod assist   Step pivot transfers: Min assist, Mod assist       General transfer comment: Alot of increased time to perform due to cognition. She was able to stand to RW with assistance    Ambulation/Gait Ambulation/Gait assistance: Min assist, Mod assist Gait Distance (Feet): 3 Feet Assistive device: Rolling walker (2 wheels) Gait Pattern/deviations: Step-to pattern Gait velocity: slow     General Gait Details: poor steps to chair but follow cues for safety   Stairs             Wheelchair Mobility    Modified Rankin (Stroke Patients Only)       Balance Overall balance assessment: Needs assistance Sitting-balance support: Feet supported, Bilateral upper extremity supported Sitting balance-Leahy Scale: Good     Standing balance support: Bilateral upper extremity supported, Reliant on assistive device for balance Standing balance-Leahy Scale: Fair Standing balance comment: poor standing posture. flexed posture during standing activity                            Cognition Arousal/Alertness: Awake/alert Behavior During Therapy: WFL for tasks assessed/performed Overall Cognitive Status: Impaired/Different from baseline                                 General Comments: clearer this session but does have trouble with word finding and she is aware  of deficits.        Exercises Total Joint Exercises Ankle Circles/Pumps: PROM, Right, 20 reps, Supine Heel Slides: Right, 10 reps, Supine, AAROM Hip ABduction/ADduction: AAROM, Supine, Right, 10 reps Straight Leg Raises: AAROM, Supine, Right, 10 reps Long Arc Quad: AAROM, Seated, 5 reps Knee Flexion: AAROM, Seated, 5 reps Goniometric ROM: 5-45 Other  Exercises Other Exercises: seated stretching focusing on flexion    General Comments        Pertinent Vitals/Pain Pain Assessment Pain Assessment: Faces Faces Pain Scale: Hurts whole lot Pain Location: R knee with mobility and felxion/ROM Pain Descriptors / Indicators: Discomfort, Dull, Grimacing Pain Intervention(s): Limited activity within patient's tolerance, Monitored during session, Repositioned, Ice applied    Home Living                          Prior Function            PT Goals (current goals can now be found in the care plan section) Progress towards PT goals: Progressing toward goals    Frequency    BID      PT Plan Current plan remains appropriate    Co-evaluation              AM-PAC PT "6 Clicks" Mobility   Outcome Measure  Help needed turning from your back to your side while in a flat bed without using bedrails?: A Lot Help needed moving from lying on your back to sitting on the side of a flat bed without using bedrails?: A Little Help needed moving to and from a bed to a chair (including a wheelchair)?: A Lot Help needed standing up from a chair using your arms (e.g., wheelchair or bedside chair)?: A Lot Help needed to walk in hospital room?: A Lot Help needed climbing 3-5 steps with a railing? : Total 6 Click Score: 12    End of Session Equipment Utilized During Treatment: Gait belt Activity Tolerance: Patient tolerated treatment well Patient left: in bed;with call bell/phone within reach;with bed alarm set Nurse Communication: Mobility status PT Visit Diagnosis: Muscle weakness (generalized) (M62.81);Difficulty in walking, not elsewhere classified (R26.2);Pain Pain - Right/Left: Right Pain - part of body: Knee     Time: 1100-1115 PT Time Calculation (min) (ACUTE ONLY): 15 min  Charges:  $Therapeutic Exercise: 8-22 mins $Therapeutic Activity: 8-22 mins                    Chesley Noon, PTA 02/19/21, 12:08 PM

## 2021-02-19 NOTE — TOC Progression Note (Addendum)
Transition of Care [facility]) - Progression Note    Patient Details  Name: [name] MRN: [id number] Date of Birth: 27-Feb-1940  [facility] CM/SW [place], RN Phone Number: 02/19/2021, 8:58 AM  Clinical Narrative:   Ins auth started this morning ref number [id number], to go to [facility] today [place] approved        Expected Discharge Plan and Services                                                 Social Determinants of Health (SDOH) Interventions    Readmission Risk Interventions No flowsheet data found.

## 2021-02-20 DIAGNOSIS — G9340 Encephalopathy, unspecified: Secondary | ICD-10-CM

## 2021-02-20 DIAGNOSIS — Z79899 Other long term (current) drug therapy: Secondary | ICD-10-CM

## 2021-02-20 DIAGNOSIS — R410 Disorientation, unspecified: Secondary | ICD-10-CM

## 2021-02-20 DIAGNOSIS — Z96651 Presence of right artificial knee joint: Secondary | ICD-10-CM

## 2021-02-20 LAB — CBC
HCT: 25.7 % — ABNORMAL LOW (ref 36.0–46.0)
Hemoglobin: 8.7 g/dL — ABNORMAL LOW (ref 12.0–15.0)
MCH: 31.8 pg (ref 26.0–34.0)
MCHC: 33.9 g/dL (ref 30.0–36.0)
MCV: 93.8 fL (ref 80.0–100.0)
Platelets: 272 10*3/uL (ref 150–400)
RBC: 2.74 MIL/uL — ABNORMAL LOW (ref 3.87–5.11)
RDW: 13.8 % (ref 11.5–15.5)
WBC: 10.1 10*3/uL (ref 4.0–10.5)
nRBC: 0 % (ref 0.0–0.2)

## 2021-02-20 LAB — BASIC METABOLIC PANEL
Anion gap: 9 (ref 5–15)
BUN: 19 mg/dL (ref 8–23)
CO2: 24 mmol/L (ref 22–32)
Calcium: 9 mg/dL (ref 8.9–10.3)
Chloride: 104 mmol/L (ref 98–111)
Creatinine, Ser: 1 mg/dL (ref 0.44–1.00)
GFR, Estimated: 56 mL/min — ABNORMAL LOW (ref >60)
Glucose, Bld: 116 mg/dL — ABNORMAL HIGH (ref 70–99)
Potassium: 3.4 mmol/L — ABNORMAL LOW (ref 3.5–5.1)
Sodium: 137 mmol/L (ref 135–145)

## 2021-02-20 LAB — HEPATIC FUNCTION PANEL
ALT: 11 U/L (ref 0–44)
AST: 13 U/L — ABNORMAL LOW (ref 15–41)
Albumin: 2.9 g/dL — ABNORMAL LOW (ref 3.5–5.0)
Alkaline Phosphatase: 58 U/L (ref 38–126)
Bilirubin, Direct: 0.3 mg/dL — ABNORMAL HIGH (ref 0.0–0.2)
Indirect Bilirubin: 0.7 mg/dL (ref 0.3–0.9)
Total Bilirubin: 1 mg/dL (ref 0.3–1.2)
Total Protein: 6.4 g/dL — ABNORMAL LOW (ref 6.5–8.1)

## 2021-02-20 LAB — VITAMIN B12: Vitamin B-12: 1637 pg/mL — ABNORMAL HIGH (ref 180–914)

## 2021-02-20 LAB — TSH: TSH: 4.342 u[IU]/mL (ref 0.350–4.500)

## 2021-02-20 LAB — AMMONIA: Ammonia: <10 umol/L (ref 9–35)

## 2021-02-20 MED ORDER — TRAZODONE HCL 50 MG PO TABS: 25.0000 mg | ORAL_TABLET | Freq: Every evening | ORAL | Status: DC | PRN

## 2021-02-20 MED ORDER — ACETAMINOPHEN 500 MG PO TABS
1000.0000 mg | ORAL_TABLET | Freq: Four times a day (QID) | ORAL | Status: DC | PRN
  Administered 2021-02-20 – 2021-02-22 (×6): 1000 mg via ORAL
  Filled 2021-02-20 (×7): qty 2

## 2021-02-20 MED ORDER — TRAZODONE HCL 50 MG PO TABS: 50.0000 mg | ORAL_TABLET | Freq: Every evening | ORAL | Status: DC | PRN

## 2021-02-20 NOTE — Progress Notes (Signed)
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   OT comments  Pt seen for OT treatment on this date. Upon arrival to room, pt asleep in bed. Pt awoken following light touch, but remained lethargic throughout session (RN made aware and suspects lethargy d/t pain medication from last night). Pt currently presents with impaired cognition, decreased balance, and decreased ROM of RLE. Due to these functional impairments, pt requires MIN A for bed mobility, MOD A for step pivot transfers bed>recliner, and SUPERVISION/SET-UP for seated grooming tasks. Further ADLs/functional mobility deferred in setting of pt's lethargy. Pt is making good progress toward goals and continues to benefit from skilled OT services to maximize return to PLOF and minimize risk of future falls, injury, caregiver burden, and readmission. Will continue to follow POC. Discharge recommendation remains appropriate.     Recommendations for follow up therapy are one component of a multi-disciplinary discharge planning process, led by the attending physician.  Recommendations may be updated based on patient status, additional functional criteria and insurance authorization.    Follow Up Recommendations  Skilled nursing-short term rehab (<3 hours/day)    Assistance Recommended at Discharge Frequent or constant Supervision/Assistance  Patient can return home with the following  A lot of help with walking and/or transfers;A lot of help with bathing/dressing/bathroom;Assistance with cooking/housework;Help with stairs or ramp for entrance   Equipment Recommendations  BSC/3in1       Precautions / Restrictions Precautions Precautions: Fall;Knee Restrictions Weight Bearing Restrictions: Yes RLE Weight Bearing: Weight bearing as tolerated       Mobility Bed  Mobility Overal bed mobility: Needs Assistance Bed Mobility: Supine to Sit     Supine to sit: Min assist     General bed mobility comments: Requires verbal cues for sequencing and MIN A for trunk support    Transfers Overall transfer level: Needs assistance Equipment used: Rolling walker (2 wheels) Transfers: Sit to/from Stand Sit to Stand: Mod assist     Step pivot transfers: Mod assist     General transfer comment: Requires increased physical assist and multi-modal cues for sequencing transfer     Balance Overall balance assessment: Needs assistance Sitting-balance support: No upper extremity supported, Feet supported Sitting balance-Leahy Scale: Good     Standing balance support: Bilateral upper extremity supported, During functional activity, Reliant on assistive device for balance Standing balance-Leahy Scale: Poor Standing balance comment: Requires MIN A for static standing balance                           ADL either performed or assessed with clinical judgement   ADL Overall ADL's : Needs assistance/impaired     Grooming: Wash/dry face;Brushing hair;Supervision/safety;Set up;Sitting Grooming Details (indicate cue type and reason): While sitting in recliner, pt able to bring grooming tools to face/head, but requires cues for initiating and completing task                                      Cognition Arousal/Alertness: Lethargic, Suspect due to medications Behavior During Therapy: Flat affect Overall Cognitive Status: Difficult to assess  General Comments: Pt lethargic, requiring frequent verbal cues to initiate/complete tasks, but pleasant and agreeable throughout              General Comments Gentle prolonged knee flexion stretch in sitting    Pertinent Vitals/ Pain       Pain Assessment Pain Assessment: No/denies pain         Frequency  Min 2X/week        Progress  Toward Goals  OT Goals(current goals can now be found in the care plan section)  Progress towards OT goals: Progressing toward goals  Acute Rehab OT Goals Patient Stated Goal: to go home OT Goal Formulation: With patient/family Time For Goal Achievement: 03/02/21 Potential to Achieve Goals: [place] Discharge plan remains appropriate;Frequency remains appropriate       AM-PAC OT "6 Clicks" Daily Activity     Outcome Measure   Help from another person eating meals?: A Little Help from another person taking care of personal grooming?: A Little Help from another person toileting, which includes using toliet, bedpan, or urinal?: A Lot Help from another person bathing (including washing, rinsing, drying)?: A Lot Help from another person to put on and taking off regular upper body clothing?: A Little Help from another person to put on and taking off regular lower body clothing?: A Lot 6 Click Score: 15    End of Session Equipment Utilized During Treatment: Rolling walker (2 wheels);Gait belt  OT Visit Diagnosis: Other abnormalities of gait and mobility (R26.89);Muscle weakness (generalized) (M62.81)   Activity Tolerance Patient tolerated treatment well   Patient Left in chair;with call bell/phone within reach;with chair alarm set   Nurse Communication Mobility status        Time: 1610-9604 OT Time Calculation (min): 24 min  Charges: OT General Charges $OT Visit: 1 Visit OT Treatments $Self Care/Home Management : 23-37 mins  [name], OTR/L [place]

## 2021-02-20 NOTE — Progress Notes (Signed)
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    PT Comments    Pt sleeping in bed upon arrival. Educated pt's family on working on day/night orientation to assist pt with current lethargy/delirium. Pt easily awakened with verbal and tactile cues. ModA for supine to sit. Sit to stand with MinA to RW. Pt progressed gait distance as planned, to 82ft with RW and CG/MinA for walker management and safety. Repeated cues to correct forward flexed posture. Pt assisted to recliner for Right knee ROM/strengthening, knee flexion in sitting 4-75 degrees with minimal grimacing.  Pt positioned to comfort, R LE supported on stool, and set up for lunch.  Pt performed very well this pm and responds well to clear simple, continuous commands/engagement. Daytime orientation is important as well as encouraging pt to remain awake during the day with activity. Pt appears to be improving and will benefit from continued PT upon d/c.   Recommendations for follow up therapy are one component of a multi-disciplinary discharge planning process, led by the attending physician.  Recommendations may be updated based on patient status, additional functional criteria and insurance authorization.  Follow Up Recommendations  Skilled nursing-short term rehab (<3 hours/day)     Assistance Recommended at Discharge Frequent or constant Supervision/Assistance  Patient can return home with the following A lot of help with walking and/or transfers   Equipment Recommendations  Other (comment)    Recommendations for Other Services       Precautions / Restrictions Precautions Precautions: Fall;Knee Precaution Booklet Issued: No Restrictions Weight Bearing Restrictions: No     Mobility  Bed Mobility Overal bed mobility: Needs Assistance Bed Mobility: Supine to  Sit     Supine to sit: Min assist     General bed mobility comments:  (Verbal cues for technique)    Transfers Overall transfer level: Needs assistance Equipment used: Rolling walker (2 wheels) Transfers: Sit to/from Stand Sit to Stand: Mod assist           General transfer comment: Pt able to assist more if given time to complete task    Ambulation/Gait Ambulation/Gait assistance: Min assist Gait Distance (Feet): 15 Feet Assistive device: Rolling walker (2 wheels) Gait Pattern/deviations: Step-to pattern, Trunk flexed Gait velocity: slow     General Gait Details: Improved steps and weight shifting today   Stairs             Wheelchair Mobility    Modified Rankin (Stroke Patients Only)       Balance                                            Cognition Arousal/Alertness: Lethargic, Suspect due to medications Behavior During Therapy: Flat affect Overall Cognitive Status: Difficult to assess Area of Impairment: Attention, Awareness                               General Comments: During session, pt able to recall hospital, room #, reason for stay.        Exercises Total Joint Exercises Ankle Circles/Pumps: AAROM, Right, 20 reps Long Arc Quad: AAROM, 10 reps Knee Flexion: AAROM, Seated, 5 reps Goniometric ROM: 4-75 sitting  General Comments        Pertinent Vitals/Pain Pain Assessment Pain Assessment: Faces Faces Pain Scale: Hurts little more Pain Location: R knee with mobility and felxion/ROM Pain Descriptors / Indicators: Discomfort, Dull, Grimacing (during weight bearing) Pain Intervention(s): Monitored during session, Ice applied    Home Living                          Prior Function            PT Goals (current goals can now be found in the care plan section) Acute Rehab PT Goals Patient Stated Goal: none stated    Frequency    BID      PT Plan Current plan remains  appropriate    Co-evaluation              AM-PAC PT "6 Clicks" Mobility   Outcome Measure  Help needed turning from your back to your side while in a flat bed without using bedrails?: A Lot Help needed moving from lying on your back to sitting on the side of a flat bed without using bedrails?: A Little Help needed moving to and from a bed to a chair (including a wheelchair)?: A Lot Help needed standing up from a chair using your arms (e.g., wheelchair or bedside chair)?: A Little Help needed to walk in hospital room?: A Little Help needed climbing 3-5 steps with a railing? : Total 6 Click Score: 14    End of Session Equipment Utilized During Treatment: Gait belt Activity Tolerance: Patient tolerated treatment well Patient left: in chair;with call bell/phone within reach;with chair alarm set;with SCD's reapplied Nurse Communication: Mobility status PT Visit Diagnosis: Muscle weakness (generalized) (M62.81);Difficulty in walking, not elsewhere classified (R26.2);Pain Pain - Right/Left: Right Pain - part of body: Knee     Time: 1440-1536 PT Time Calculation (min) (ACUTE ONLY): 56 min  Charges:  $Gait Training: 8-22 mins $Therapeutic Exercise: 8-22 mins $Therapeutic Activity: 8-22 mins                    [name], PTA    [name] 02/20/2021, 5:16 PM

## 2021-02-20 NOTE — Progress Notes (Addendum)
° °  Subjective: 5 Days Post-Op Procedure(s) (LRB): TOTAL KNEE REVISION (Right) Patient sleeping. More confused than yesterday Patient is well, and has had no acute complaints or problems. Denies any CP, SOB, ABD pain. We will continue therapy today.  Plan is to go Skilled nursing facility after hospital stay.  Objective: Vital signs in last 24 hours: Temp:  [98.2 F (36.8 C)-98.8 F (37.1 C)] 98.8 F (37.1 C) (01/31 0918) Pulse Rate:  [67-78] 67 (01/31 0918) Resp:  [18] 18 (01/31 0918) BP: (157-162)/(46-62) 157/46 (01/31 0918) SpO2:  [92 %-95 %] 92 % (01/31 0918)  Intake/Output from previous day: 01/30 0701 - 01/31 0700 In: 360 [P.O.:360] Out: 600 [Urine:600] Intake/Output this shift: No intake/output data recorded.  Recent Labs    02/18/21 0810 02/18/21 1644 02/19/21 0434  HGB 6.8* 8.6* 8.6*   Recent Labs    02/18/21 1644 02/19/21 0434  WBC  --  11.1*  RBC  --  2.72*  HCT 25.3* 25.5*  PLT  --  239   No results for input(s): NA, K, CL, CO2, BUN, CREATININE, GLUCOSE, CALCIUM in the last 72 hours.  No results for input(s): LABPT, INR in the last 72 hours.  EXAM General - Patient is Alert and Confused.  Extremity - Neurovascular intact Sensation intact distally Intact pulses distally Dorsiflexion/Plantar flexion intact Dressing - dressing C/D/I and no drainage, provena intact with out drainage Motor Function - intact, moving foot and toes well on exam.   Past Medical History:  Diagnosis Date   Anxiety    Aortic atherosclerosis (HCC)    Arthritis    Current use of long term anticoagulation    APIXABAN   Dysrhythmia    A-fib   GERD (gastroesophageal reflux disease)    History of 2019 novel coronavirus disease (COVID-19) 07/2019   History of kidney stones    Hypercholesteremia    Hypertension    IDA (iron deficiency anemia)    Paroxysmal atrial fibrillation (HCC)    Skin cancer    Skin - squamous    Assessment/Plan:   5 Days Post-Op Procedure(s)  (LRB): TOTAL KNEE REVISION (Right) Principal Problem:   S/P revision of total knee, right  Estimated body mass index is 30.36 kg/m as calculated from the following:   Height as of this encounter: 5\' 2"  (1.575 m).   Weight as of this encounter: 75.3 kg. Advance diet Up with therapy Work on BM VSS Acute post op blood loss anemia - Hgb 8.6, s/p 1 unit of PRBC Delerium - continue to monitor. DC narcotics. Neuro consult ordered CM to assist with discharge to SNF   DVT Prophylaxis - TED hose and SCDs, Eliquis Weight-Bearing as tolerated to right leg   T. Rachelle Hora, PA-C [place] 02/20/2021, 9:33 AM

## 2021-02-20 NOTE — Progress Notes (Signed)
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    PT Comments    Pt seen this am, received in chair, sleepy, but easily awakened. Pt continues to have some word finding issues when finishing thoughts due to lethargy. However good progress today if given extra time to complete tasks. Pt able to stand from recliner with MinA and complete gait training forward 12ft with RW and CG/MinA. Flexed forward posture noted, corrected with verbal/manual cues, no noted significant increase in R knee pain with weight bearing.  Will plan to progress gait distance this pm.     Recommendations for follow up therapy are one component of a multi-disciplinary discharge planning process, led by the attending physician.  Recommendations may be updated based on patient status, additional functional criteria and insurance authorization.  Follow Up Recommendations  Skilled nursing-short term rehab (<3 hours/day)     Assistance Recommended at Discharge Frequent or constant Supervision/Assistance  Patient can return home with the following A lot of help with walking and/or transfers   Equipment Recommendations       Recommendations for Other Services       Precautions / Restrictions Precautions Precautions: Fall;Knee Restrictions Weight Bearing Restrictions: Yes RLE Weight Bearing: Weight bearing as tolerated     Mobility  Bed Mobility               General bed mobility comments: Pt was in recliner pre/post session    Transfers Overall transfer level: Needs assistance Equipment used: Rolling walker (2 wheels) Transfers: Sit to/from Stand Sit to Stand: Min assist (with increased time to transition to standing)           General transfer comment: Pt able to assist more if given time to complete task     Ambulation/Gait Ambulation/Gait assistance: Min assist Gait Distance (Feet): 10 Feet Assistive device: Rolling walker (2 wheels) Gait Pattern/deviations: Step-to pattern, Trunk flexed Gait velocity: slow     General Gait Details:  (repeated vc's to correct forward flexed posture)   Stairs             Wheelchair Mobility    Modified Rankin (Stroke Patients Only)       Balance                                            Cognition Arousal/Alertness: Awake/alert Behavior During Therapy: WFL for tasks assessed/performed Overall Cognitive Status: Impaired/Different from baseline Area of Impairment: Orientation, Memory, Awareness, Safety/judgement                               General Comments: clearer this session but does have trouble with word finding and she is aware of deficits.        Exercises Total Joint Exercises Ankle Circles/Pumps: AAROM, Right, 20 reps Long Arc Quad: AAROM, 10 reps    General Comments General comments (skin integrity, edema, etc.): Gentle prolonged knee flexion stretch in sitting      Pertinent Vitals/Pain Pain Assessment Pain Assessment: Faces Faces Pain Scale: Hurts little more Pain Location: R knee with mobility and felxion/ROM Pain Descriptors / Indicators: Discomfort, Dull, Grimacing Pain Intervention(s): Monitored during session, Ice applied  Home Living                          Prior Function            PT Goals (current goals can now be found in the care plan section) Acute Rehab PT Goals Patient Stated Goal: none stated    Frequency    BID      PT Plan Current plan remains appropriate    Co-evaluation              AM-PAC PT "6 Clicks" Mobility   Outcome Measure  Help needed turning from your back to your side while in a flat bed without using bedrails?: A Lot Help needed moving from lying on your back to sitting on the side of a flat bed without using  bedrails?: A Little Help needed moving to and from a bed to a chair (including a wheelchair)?: A Lot Help needed standing up from a chair using your arms (e.g., wheelchair or bedside chair)?: A Little Help needed to walk in hospital room?: A Little Help needed climbing 3-5 steps with a railing? : Total 6 Click Score: 14    End of Session Equipment Utilized During Treatment: Gait belt Activity Tolerance: Patient tolerated treatment well Patient left: in chair;with call bell/phone within reach;with chair alarm set;with SCD's reapplied Nurse Communication: Mobility status PT Visit Diagnosis: Muscle weakness (generalized) (M62.81);Difficulty in walking, not elsewhere classified (R26.2);Pain Pain - Right/Left: Right Pain - part of body: Knee     Time: 3300-7622 PT Time Calculation (min) (ACUTE ONLY): 39 min  Charges:  $Gait Training: 8-22 mins $Therapeutic Exercise: 8-22 mins $Therapeutic Activity: 8-22 mins          Mikel Cella, PTA    [name] 02/20/2021, 11:20 AM

## 2021-02-20 NOTE — Progress Notes (Deleted)
Physical Therapy Treatment Patient Details Name: [name] MRN: [id number] DOB: 03-16-1940 Today's Date: 02/20/2021   History of Present Illness Pt admitted for R TKR revision. History of initial TKR in 2012.    PT Comments    Pt seen this am up in recliner upon arrival. Easily awakened, yet continued difficulty completing thoughts due to lethargy. Pt however was able to progress with mobility this am. Transfers with MinA today when given increased time to complete.  Gait training 106ft with RW and CG/MinA. Fair acceptance of weight through R LE with verbal cues to correct forward flexed posture. Will continue to progress gait this pm. Continue to progress per POC.    Recommendations for follow up therapy are one component of a multi-disciplinary discharge planning process, led by the attending physician.  Recommendations may be updated based on patient status, additional functional criteria and insurance authorization.  Follow Up Recommendations  Skilled nursing-short term rehab (<3 hours/day)     Assistance Recommended at Discharge Frequent or constant Supervision/Assistance  Patient can return home with the following A lot of help with walking and/or transfers   Equipment Recommendations       Recommendations for Other Services       Precautions / Restrictions Precautions Precautions: Fall;Knee Restrictions Weight Bearing Restrictions: Yes RLE Weight Bearing: Weight bearing as tolerated     Mobility  Bed Mobility               General bed mobility comments: Pt was in recliner pre/post session    Transfers Overall transfer level: Needs assistance Equipment used: Rolling walker (2 wheels) Transfers: Sit to/from Stand Sit to Stand: Min assist (with increased time to transition to standing)           General transfer comment: Pt able to assist more if given time to complete task    Ambulation/Gait Ambulation/Gait assistance: Min assist Gait Distance  (Feet): 10 Feet Assistive device: Rolling walker (2 wheels) Gait Pattern/deviations: Step-to pattern, Trunk flexed Gait velocity: slow     General Gait Details:  (repeated vc's to correct forward flexed posture)   Stairs             Wheelchair Mobility    Modified Rankin (Stroke Patients Only)       Balance                                            Cognition Arousal/Alertness: Awake/alert Behavior During Therapy: WFL for tasks assessed/performed Overall Cognitive Status: Impaired/Different from baseline Area of Impairment: Orientation, Memory, Awareness, Safety/judgement                               General Comments: clearer this session but does have trouble with word finding and she is aware of deficits.        Exercises Total Joint Exercises Ankle Circles/Pumps: AAROM, Right, 20 reps Long Arc Quad: AAROM, 10 reps    General Comments General comments (skin integrity, edema, etc.): Gentle prolonged knee flexion stretch in sitting      Pertinent Vitals/Pain Pain Assessment Pain Assessment: Faces Faces Pain Scale: Hurts little more Pain Location: R knee with mobility and felxion/ROM Pain Descriptors / Indicators: Discomfort, Dull, Grimacing Pain Intervention(s): Monitored during session, Ice applied    Home Living  Prior Function            PT Goals (current goals can now be found in the care plan section) Acute Rehab PT Goals Patient Stated Goal: none stated    Frequency    BID      PT Plan Current plan remains appropriate    Co-evaluation              AM-PAC PT "6 Clicks" Mobility   Outcome Measure  Help needed turning from your back to your side while in a flat bed without using bedrails?: A Lot Help needed moving from lying on your back to sitting on the side of a flat bed without using bedrails?: A Little Help needed moving to and from a bed to a chair  (including a wheelchair)?: A Lot Help needed standing up from a chair using your arms (e.g., wheelchair or bedside chair)?: A Little Help needed to walk in hospital room?: A Little Help needed climbing 3-5 steps with a railing? : Total 6 Click Score: 14    End of Session Equipment Utilized During Treatment: Gait belt Activity Tolerance: Patient tolerated treatment well Patient left: in chair;with call bell/phone within reach;with chair alarm set;with SCD's reapplied Nurse Communication: Mobility status PT Visit Diagnosis: Muscle weakness (generalized) (M62.81);Difficulty in walking, not elsewhere classified (R26.2);Pain Pain - Right/Left: Right Pain - part of body: Knee     Time: 2119-4174 PT Time Calculation (min) (ACUTE ONLY): 39 min  Charges:  $Gait Training: 8-22 mins $Therapeutic Exercise: 8-22 mins $Therapeutic Activity: 8-22 mins                    Mikel Cella, PTA    [name] 02/20/2021, 11:28 AM

## 2021-02-20 NOTE — Consult Note (Signed)
NEUROLOGY CONSULTATION NOTE   Date of service: February 20, 2021 Patient Name: [name] MRN:  [id number] DOB:  [birth year] Reason for consult: AMS Requesting physician: Dorise Hiss PA _ _ _   _ __   _ __ _ _  __ __   _ __   __ _  History of Present Illness   This is an 81 yo woman with hx a fib on eliquis (held this admission for surgery), HTN, HL admitted for R total knee revision on whom neurology is consulted for AMS. Per son at bedside, she has no cognitive deficits at baseline, but has become progressively confused since admission. She received norco overnight and was extremely altered this AM, but AMS preceded this. Yesterday she did not know who her son was and she was making inappropriate comments towards him. She has been crying out for her husband, who died 48 years ago. She has xanax prn and has received it 1-2 times daily and ambien qhs prn and has received that twice (1/26 and 1/30). BMP, CBC, UA unremarkable.   ROS   Per HPI: all other systems reviewed and are negative  Past History   I have reviewed the following:  Past Medical History:  Diagnosis Date   Anxiety    Aortic atherosclerosis ([place])    Arthritis    Current use of long term anticoagulation    APIXABAN   Dysrhythmia    A-fib   GERD (gastroesophageal reflux disease)    History of 2019 novel coronavirus disease (COVID-19) 07/2019   History of kidney stones    Hypercholesteremia    Hypertension    IDA (iron deficiency anemia)    Paroxysmal atrial fibrillation (HCC)    Skin cancer    Skin - squamous   Past Surgical History:  Procedure Laterality Date   ABDOMINAL HYSTERECTOMY     BREAST CYST ASPIRATION Left 11/21/2015   resolved   BREAST EXCISIONAL BIOPSY Left 1973   benign   CATARACT EXTRACTION W/PHACO Right 07/13/2014   Procedure: CATARACT EXTRACTION PHACO AND INTRAOCULAR LENS PLACEMENT ([place]);  Surgeon: Leandrew Koyanagi, MD;  Location: [place];  Service: Ophthalmology;   Laterality: Right;   CATARACT EXTRACTION W/PHACO Left 10/05/2014   Procedure: CATARACT EXTRACTION PHACO AND INTRAOCULAR LENS PLACEMENT ([facility]);  Surgeon: Leandrew Koyanagi, MD;  Location: [place];  Service: Ophthalmology;  Laterality: Left;   COLONOSCOPY     JOINT REPLACEMENT     knee replacement right   LUMBAR LAMINECTOMY/DECOMPRESSION MICRODISCECTOMY N/A 05/10/2020   Procedure: L3-5 DECOMPRESSION;  Surgeon: Meade Maw, MD;  Location: [facility];  Service: Neurosurgery;  Laterality: N/A;   SEPTOPLASTY     TOTAL HIP ARTHROPLASTY Left 06/16/2020   Procedure: TOTAL HIP ARTHROPLASTY ANTERIOR APPROACH;  Surgeon: Hessie Knows, MD;  Location: [facility];  Service: Orthopedics;  Laterality: Left;   TOTAL KNEE REVISION Right 02/15/2021   Procedure: TOTAL KNEE REVISION;  Surgeon: Hessie Knows, MD;  Location: [facility];  Service: Orthopedics;  Laterality: Right;   Family History  Problem Relation Age of Onset   Dementia Mother    Heart attack Father    CAD Father    Breast cancer Neg Hx    Social History   Socioeconomic History   Marital status: Widowed    Spouse name: Not on file   Number of children: Not on file   Years of education: Not on file   Highest education level: Not on file  Occupational History   Not on file  Tobacco Use   Smoking status: Never   Smokeless tobacco: Never  Vaping Use   Vaping Use: Never used  Substance and Sexual Activity   Alcohol use: No   Drug use: Never   Sexual activity: Not on file  Other Topics Concern   Not on file  Social History Narrative   Not on file   Social Determinants of Health   Financial Resource Strain: Not on file  Food Insecurity: Not on file  Transportation Needs: Not on file  Physical Activity: Not on file  Stress: Not on file  Social Connections: Not on file   Allergies  Allergen Reactions   Aspirin     Causes bleeding in stomach   Nsaids Other (See Comments)    Bleeding in stomach   Methocarbamol  Other (See Comments)    Hallucinations   Morphine And Related Other (See Comments)    Disoriented hallucinations   Oxycodone Other (See Comments)    Disoriented Hallucinations     Medications   Medications Prior to Admission  Medication Sig Dispense Refill Last Dose   amLODipine (NORVASC) 5 MG tablet Take 5 mg by mouth in the morning.   02/15/2021   carboxymethylcellulose (REFRESH PLUS) 0.5 % SOLN Place 1 drop into both eyes daily as needed (dry eyes).      Cholecalciferol (VITAMIN D) 50 MCG (2000 UT) CAPS Take 2,000 Units by mouth in the morning.      diphenhydramine-acetaminophen (TYLENOL PM) 25-500 MG TABS tablet Take 1 tablet by mouth at bedtime.      dronedarone (MULTAQ) 400 MG tablet Take 400 mg by mouth 2 (two) times daily with a meal.   02/15/2021   ELIQUIS 5 MG TABS tablet Take 1 tablet by mouth 2 times daily. Call clinic - overdue for appt with Dr Rayann Heman. (Patient taking differently: Take 5 mg by mouth 2 (two) times daily.) 60 tablet 1 02/11/21   estradiol (ESTRACE) 0.5 MG tablet Take 0.5 mg by mouth every morning.   02/15/2021   gabapentin (NEURONTIN) 300 MG capsule Take 300 mg by mouth 3 (three) times daily.   02/15/2021   Multiple Vitamin (MULTIVITAMIN) tablet Take 1 tablet by mouth every morning.      omeprazole (PRILOSEC) 20 MG capsule Take 20 mg by mouth 2 (two) times daily.   02/15/2021   pravastatin (PRAVACHOL) 40 MG tablet Take 40 mg by mouth at bedtime.      triamcinolone cream (KENALOG) 0.1 % Apply 1 application topically 2 (two) times daily as needed (rash).   Past Month   escitalopram (LEXAPRO) 10 MG tablet Take 10 mg by mouth at bedtime.         Current Facility-Administered Medications:    0.9 %  sodium chloride infusion (Manually program via Guardrails IV Fluids), , Intravenous, Once, [employer], PA-C   0.9 %  sodium chloride infusion, , Intravenous, Continuous, Hessie Knows, MD, Last Rate: 75 mL/hr at 02/15/21 1312, New Bag at 02/15/21 1312   acetaminophen  (TYLENOL) tablet 1,000 mg, 1,000 mg, Oral, Q6H PRN, Duanne Guess, PA-C, 1,000 mg at 02/20/21 1755   ALPRAZolam (XANAX) tablet 0.25 mg, 0.25 mg, Oral, BID PRN, Reche Dixon, PA-C, 0.25 mg at 02/20/21 0923   alum & mag hydroxide-simeth (MAALOX/MYLANTA) 200-200-20 MG/5ML suspension 30 mL, 30 mL, Oral, Q4H PRN, Hessie Knows, MD   amLODipine (NORVASC) tablet 5 mg, 5 mg, Oral, q AM, Dorise Hiss C, PA-C, 5 mg at 02/20/21 0636   bisacodyl (DULCOLAX) suppository 10 mg,  10 mg, Rectal, Daily PRN, Hessie Knows, MD   cholecalciferol (VITAMIN D3) tablet 2,000 Units, 2,000 Units, Oral, q AM, Hessie Knows, MD, 2,000 Units at 02/20/21 1754   diphenhydrAMINE (BENADRYL) 12.5 MG/5ML elixir 12.5-25 mg, 12.5-25 mg, Oral, Q4H PRN, Hessie Knows, MD, 12.5 mg at 02/17/21 2303   docusate sodium (COLACE) capsule 100 mg, 100 mg, Oral, BID, Hessie Knows, MD, 100 mg at 02/20/21 7989   dronedarone (MULTAQ) tablet 400 mg, 400 mg, Oral, BID WC, Hessie Knows, MD, 400 mg at 02/20/21 1755   escitalopram (LEXAPRO) tablet 10 mg, 10 mg, Oral, QHS, Hessie Knows, MD, 10 mg at 02/19/21 2024   estradiol (ESTRACE) tablet 0.5 mg, 0.5 mg, Oral, q morning, Hessie Knows, MD, 0.5 mg at 02/20/21 2119   ferrous [id number] C-folic acid (TRINSICON / FOLTRIN) capsule 1 capsule, 1 capsule, Oral, BID PC, Hessie Knows, MD, 1 capsule at 02/20/21 1755   menthol-cetylpyridinium (CEPACOL) lozenge 3 mg, 1 lozenge, Oral, PRN **OR** phenol (CHLORASEPTIC) mouth spray 1 spray, 1 spray, Mouth/Throat, PRN, Hessie Knows, MD   metoCLOPramide (REGLAN) tablet 5-10 mg, 5-10 mg, Oral, Q8H PRN **OR** metoCLOPramide (REGLAN) injection 5-10 mg, 5-10 mg, Intravenous, Q8H PRN, Hessie Knows, MD   multivitamin with minerals tablet 1 tablet, 1 tablet, Oral, q morning, Hessie Knows, MD, 1 tablet at 02/19/21 1015   ondansetron (ZOFRAN) tablet 4 mg, 4 mg, Oral, Q6H PRN **OR** ondansetron (ZOFRAN) injection 4 mg, 4 mg, Intravenous, Q6H PRN, Hessie Knows,  MD   pantoprazole (PROTONIX) EC tablet 80 mg, 80 mg, Oral, Daily, Hessie Knows, MD, 80 mg at 02/20/21 7026   polyvinyl alcohol (LIQUIFILM TEARS) 1.4 % ophthalmic solution 1 drop, 1 drop, Both Eyes, Daily PRN, Hessie Knows, MD   pravastatin (PRAVACHOL) tablet 40 mg, 40 mg, Oral, QHS, Hessie Knows, MD, 40 mg at 02/19/21 2024   senna-docusate (Senokot-S) tablet 1 tablet, 1 tablet, Oral, QHS PRN, Hessie Knows, MD, 1 tablet at 02/18/21 2055   sodium phosphate (FLEET) 7-19 GM/118ML enema 1 enema, 1 enema, Rectal, Once PRN, Hessie Knows, MD   triamcinolone cream (KENALOG) 0.1 % cream 1 application, 1 application, Topical, BID PRN, Hessie Knows, MD   zolpidem Lorrin Mais) tablet 5 mg, 5 mg, Oral, QHS PRN, Hessie Knows, MD, 5 mg at 02/19/21 2031  Vitals   Vitals:   02/19/21 2041 02/20/21 0635 02/20/21 0918 02/20/21 1607  BP: (!) 162/62 (!) 162/58 (!) 157/46 (!) 118/51  Pulse: 69 78 67 68  Resp: 18 18 18 16   Temp: 98.2 F (36.8 C) 98.5 F (36.9 C) 98.8 F (37.1 C) 98.6 F (37 C)  TempSrc:      SpO2: 94% 95% 92% 95%  Weight:      Height:         Body mass index is 30.36 kg/m.  Physical Exam   Physical Exam Gen: oriented to self, son and year but not month, follows simple commands but not multi-step. Processing delay HEENT: Atraumatic, normocephalic;mucous membranes moist; oropharynx clear, tongue without atrophy or fasciculations. Neck: Supple, trachea midline. Resp: CTAB, no w/r/r CV: RRR, no m/g/r; nml S1 and S2. 2+ symmetric peripheral pulses. Abd: soft/NT/ND; nabs x 4 quad Extrem: Nml bulk; no cyanosis, clubbing, or edema. Bandaged RLE post-op  Neuro: *MS: oriented to self, son and year but not month, follows simple commands but not multi-step. Processing delay *Speech: fluid, nondysarthric, slightly impaired naming and repetition *CN:    I: Deferred   II,III: PERRLA, blinks to threat bilat, optic discs unable to be  visualized 2/2 pupillary constriction   III,IV,VI: EOMI  w/o nystagmus, no ptosis   V: Sensation intact from V1 to V3 to LT   VII: Eyelid closure was full.  Smile symmetric.   VIII: Hearing intact to voice   IX,X: Voice normal, palate elevates symmetrically    XI: SCM/trap 5/5 bilat   XII: Tongue protrudes midline, no atrophy or fasciculations   *Motor:   Normal bulk.  No tremor, rigidity or bradykinesia. No pronator drift.    Strength: Dlt Bic Tri WrE WrF FgS Gr HF KnF KnE PlF DoF    Left 5 5 5 5 5 5 5 5 5 5 5 5     Right 5 5 5 5 5 5 5  * * * * *   * not tested - postop *Sensory: Intact to light touch, pinprick, temperature vibration throughout. Symmetric. Propioception intact bilat.  No double-simultaneous extinction.  *Coordination:  FNF intact bilat *Reflexes:  2+ and symmetric throughout without clonus (RLE not tested); toes down-going bilat *Gait: deferred   Labs   CBC:  Recent Labs  Lab 02/19/21 0434 02/20/21 1120  WBC 11.1* 10.1  HGB 8.6* 8.7*  HCT 25.5* 25.7*  MCV 93.8 93.8  PLT 239 683    Basic Metabolic Panel:  Lab Results  Component Value Date   NA 137 02/20/2021   K 3.4 (L) 02/20/2021   CO2 24 02/20/2021   GLUCOSE 116 (H) 02/20/2021   BUN 19 02/20/2021   CREATININE 1.00 02/20/2021   CALCIUM 9.0 02/20/2021   GFRNONAA 56 (L) 02/20/2021   GFRAA 38 (L) 06/19/2018   Lipid Panel: No results found for: LDLCALC HgbA1c: No results found for: HGBA1C Urine Drug Screen: No results found for: LABOPIA, COCAINSCRNUR, LABBENZ, AMPHETMU, THCU, LABBARB  Alcohol Level No results found for: ETH   Impression   This is an 81 yo woman with hx a fib on eliquis (held this admission for surgery), HTN, HL admitted for R total knee revision on whom neurology is consulted for AMS. Her examination is nonfocal and I suspect this all may be delirium exacerbated by [place] and xanax, but will proceed with further workup to rule out treatable causes, particularly since her mental status will impair her ability to participate in  rehab.  Recommendations   - Avoid deliriogenic meds. D/c'd xanax and ambien. Added trazodone 25mg  qhs prn for sleep as needed - Hepatic function panel, TSH, B12, ammonia - Head CT wo contrast tomorrow AM - rEEG tomorrow - If above is negative, will consider brain MRI - Will continue to follow ______________________________________________________________________   Thank you for the opportunity to take part in the care of this patient. If you have any further questions, please contact the neurology consultation attending.  Signed,  Su Monks, MD [facility] [phone]  If 7pm- 7am, please page neurology on call as listed in [place]. '

## 2021-02-20 NOTE — Progress Notes (Signed)
Patient friendly, receptive, sleepy. Son receptive, bragged on how wonderful his mom is. :)

## 2021-02-20 NOTE — Progress Notes (Signed)
°  Chaplain On-Call responded to Spiritual Care Consult Order from RN Barbee Shropshire: "Advance Directives information for patient's son; major life transition; support for son as confusion is increasing and ongoing for the patient".  Chaplain went to the Unit and received medical update from [facility]. She reported that the patient's son [name] would benefit from support by the Chaplains, in order to assist with his questions about Advance Care Planning for the patient.  Chaplain visited briefly with the patient, who spoke about the pain she feels after knee surgery.  Son [name] was not present, having been encouraged by the Nurses for self-care since he has been at the bedside since the patient's admission.  Chaplain will notify the next On-Call Chaplain for follow up.  Chaplain Pollyann Samples M.Div., [facility]

## 2021-02-21 ENCOUNTER — Inpatient Hospital Stay: Payer: Medicare Other

## 2021-02-21 DIAGNOSIS — R4182 Altered mental status, unspecified: Secondary | ICD-10-CM

## 2021-02-21 MED ORDER — TRAZODONE HCL 50 MG PO TABS: 25.0000 mg | ORAL_TABLET | Freq: Every evening | ORAL | 0 refills | Status: DC | PRN

## 2021-02-21 MED ORDER — ACETAMINOPHEN 500 MG PO TABS
1000.0000 mg | ORAL_TABLET | Freq: Four times a day (QID) | ORAL | 0 refills | Status: AC | PRN
Start: 2021-02-21 — End: ?

## 2021-02-21 MED ORDER — POTASSIUM CHLORIDE 20 MEQ PO PACK
20.0000 meq | PACK | Freq: Two times a day (BID) | ORAL | Status: AC
  Administered 2021-02-21 (×2): 20 meq via ORAL
  Filled 2021-02-21 (×2): qty 1

## 2021-02-21 NOTE — TOC Progression Note (Addendum)
Transition of Care [facility]) - Progression Note    Patient Details  Name: [name] MRN: [id number] Date of Birth: [birth year]  Transition of Care [facility]) CM/SW [place], RN Phone Number: 02/21/2021, 2:35 PM  Clinical Narrative:   Magda Paganini with [facility] called and stated that they would not accept the patient today because the Admission nurse has already left for the day, I notified the physician  I spoke with the son, and the patient, they have a bed offer at [facility] and would like to accept the bed offer, I resubmitted the notes to [facility] requesting to change to [facility]         Expected Discharge Plan and Services           Expected Discharge Date: 02/21/21                                     Social Determinants of Health (SDOH) Interventions    Readmission Risk Interventions No flowsheet data found.

## 2021-02-21 NOTE — Progress Notes (Signed)
Physical Therapy Treatment Patient Details Name: [name] MRN: [id number] DOB: 07/03/1940 Today's Date: 02/21/2021   History of Present Illness Pt admitted for R TKR revision. History of initial TKR in 2012.    PT Comments    Pt had just returned to room form CT scan upon arriving. She is alert and oriented today. Much improved cognition overall compared to previously observed by author on Saturday (1/28). She still has cognition concerns however per son," She is much closer to baseline now." Pt was agreeable to OOB activity. Performed log roll R to short sit with min-mod of one. Sat EOB and performed several exercises to improve strength and ROM. Was able to flex knee to ~ 88 degrees. Session progress to standing and ambulating ~ 12 ft with RW. Poor gait posture throughout but was able to correct with vcs prior to quickly reverting back to flexed posture. Overall pt is progressing but will greatly benefit from SNF at DC to address deficits while maximizing independence with ADLs.    Recommendations for follow up therapy are one component of a multi-disciplinary discharge planning process, led by the attending physician.  Recommendations may be updated based on patient status, additional functional criteria and insurance authorization.  Follow Up Recommendations Skilled nursing-short term rehab (<3 hours/day)    Assistance Recommended at Discharge Frequent or constant Supervision/Assistance  Patient can return home with the following A lot of help with walking and/or transfers   Equipment Recommendations  Other (comment)       Precautions / Restrictions Precautions Precautions: Fall;Knee Precaution Booklet Issued: Yes (comment) Restrictions Weight Bearing Restrictions: Yes RLE Weight Bearing: Weight bearing as tolerated     Mobility  Bed Mobility Overal bed mobility: Needs Assistance Bed Mobility: Supine to Sit     Supine to sit: Min assist, Mod assist     General bed  mobility comments: Pt was able to exit R side of bed with increased time and vcs throughout.Was able to progress to EOB with vcs for technique and sequencing.    Transfers Overall transfer level: Needs assistance Equipment used: Rolling walker (2 wheels) Transfers: Sit to/from Stand Sit to Stand: Min assist, From elevated surface           General transfer comment: Min assist to stand from slightly elevated bed height. Vcs for handplacement and fwd wt shift    Ambulation/Gait Ambulation/Gait assistance: Min assist Gait Distance (Feet): 12 Feet Assistive device: Rolling walker (2 wheels) Gait Pattern/deviations: Step-to pattern, Trunk flexed Gait velocity: slow     General Gait Details: pt was able to ambulate ~ 12 ft from EOB. poor posture duiring gait due to pain. Pt was able to erect posture with cues but unable to maintain   Balance Overall balance assessment: Needs assistance Sitting-balance support: No upper extremity supported, Feet supported Sitting balance-Leahy Scale: Good     Standing balance support: Bilateral upper extremity supported, During functional activity, Reliant on assistive device for balance Standing balance-Leahy Scale: Fair Standing balance comment: Is reliant on UE support to maintain standing balance       Cognition Arousal/Alertness: Awake/alert Behavior During Therapy: WFL for tasks assessed/performed Overall Cognitive Status: Within Functional Limits for tasks assessed      General Comments: Pt was able to answer orientation questions appropriately. she still does have some cognition concerns but per son, " she is much more clear than she has been."        Exercises Total Joint Exercises Knee Flexion: AAROM, Seated, 5  reps Goniometric ROM: 3-88 degrees        Pertinent Vitals/Pain Pain Assessment Pain Assessment: No/denies pain Pain Score: 0-No pain Pain Location: R knee with mobility and felxion/ROM Pain Descriptors /  Indicators: Grimacing, Discomfort, Guarding Pain Intervention(s): Limited activity within patient's tolerance, Monitored during session, Premedicated before session, Repositioned, Ice applied     PT Goals (current goals can now be found in the care plan section) Acute Rehab PT Goals Patient Stated Goal: none stated Progress towards PT goals: Progressing toward goals    Frequency    BID      PT Plan Current plan remains appropriate       AM-PAC PT "6 Clicks" Mobility   Outcome Measure  Help needed turning from your back to your side while in a flat bed without using bedrails?: A Lot Help needed moving from lying on your back to sitting on the side of a flat bed without using bedrails?: A Little Help needed moving to and from a bed to a chair (including a wheelchair)?: A Lot Help needed standing up from a chair using your arms (e.g., wheelchair or bedside chair)?: A Little Help needed to walk in hospital room?: A Little Help needed climbing 3-5 steps with a railing? : Total 6 Click Score: 14    End of Session Equipment Utilized During Treatment: Gait belt Activity Tolerance: Patient tolerated treatment well Patient left: in chair;with call bell/phone within reach;with chair alarm set;with SCD's reapplied Nurse Communication: Mobility status PT Visit Diagnosis: Muscle weakness (generalized) (M62.81);Difficulty in walking, not elsewhere classified (R26.2);Pain Pain - Right/Left: Right Pain - part of body: Knee     Time: 6945-0388 PT Time Calculation (min) (ACUTE ONLY): 23 min  Charges:  $Gait Training: 8-22 mins $Therapeutic Exercise: 8-22 mins                     Julaine Fusi PTA 02/21/21, 9:37 AM

## 2021-02-21 NOTE — Progress Notes (Signed)
PT Cancellation Note  Patient Details Name: [name] MRN: [id number] DOB: 07-29-1940   Cancelled Treatment:     PT attempt. Pt is currently off floor for CT scan. Author will return later this date and continue to follow per current POC.    Willette Pa 02/21/2021, 8:15 AM

## 2021-02-21 NOTE — Progress Notes (Signed)
S: Patient completely back to mental status baseline today per herself and son at bedside.   Data:  Hepatic function panel, TSH, ammonia, B12 unremarkable  Head CT NAICP  EEG WNL  O:  Vitals:   02/21/21 0351 02/21/21 0833  BP: (!) 141/48 (!) 156/50  Pulse: 63 73  Resp: 18 15  Temp: 98 F (36.7 C) 98.4 F (36.9 C)  SpO2: 95% 94%    Physical Exam Gen: A&Ox4, NAD HEENT: Atraumatic, normocephalic; oropharynx clear, tongue without atrophy or fasciculations. Resp: CTAB, normal work of breathing CV: RRR, extremities appear well-perfused. Abd: soft/NT/ND Extrem: Nml bulk; no cyanosis, clubbing, or edema.  Neuro: *MS: A&O x4. Follows multi-step commands.  *Speech: no dysarthria or aphasia, able to name and repeat. *CN:    I: Deferred   II,III: PERRLA, VFF by confrontation, optic discs not visualized 2/2 pupillary constriction   III,IV,VI: EOMI w/o nystagmus, no ptosis   V: Sensation intact from V1 to V3 to LT   VII: Eyelid closure was full.  Smile symmetric.   VIII: Hearing intact to voice   IX,X: Voice normal, palate elevates symmetrically    XI: SCM/trap 5/5 bilat   XII: Tongue protrudes midline, no atrophy or fasciculations  *Motor:   Normal bulk.  No tremor, rigidity or bradykinesia. No pronator drift.   Strength: Dlt Bic Tri WE WrF FgS Gr HF KnF KnE PlF DoF    Left 5 5 5 5 5 5 5 5 5 5 5 5     Right 5 5 5 5 5 5 5 5 5 5 5 5    *Sensory: Intact to light touch, pinprick, temperature vibration throughout. Symmetric. Propioception intact bilat.  No double-simultaneous extinction.  *Coordination:  Finger-to-nose, heel-to-shin, rapid alternating motions were intact. *Reflexes:  2+ and symmetric throughout without clonus; toes down-going bilat *Gait: deferred  A/P: This is an 81 yo woman with hx a fib on eliquis (held this admission for surgery), HTN, HL admitted for R total knee revision on whom neurology is consulted for AMS. Additional labwork, CTH, EEG all unremarkable.  Her sx resolved after discontinuing xanax and ambien and she is now back to mental status baseline.  - Avoid deliriogenic medications - No further neurologic workup indicated  Neurology to sign off, but please re-engage if additional neurologic concerns arise  Su Monks, MD [facility] [phone]  If 7pm- 7am, please page neurology on call as listed in [place].

## 2021-02-21 NOTE — Progress Notes (Signed)
°   02/21/21 1840  Clinical Encounter Type  Visited With Patient not available  Visit Type Follow-up   Chaplain Burris attempted to f/u with Pt and son, [name]. Dora, CNA, informed chaplain that she has just turned out the Pt's lights and son has left for the evening. Will refer for f/u care to see what needs are regarding AD other emotional and spiritual support. Pt will d/c on 2/2 to [facility].

## 2021-02-21 NOTE — Plan of Care (Signed)

## 2021-02-21 NOTE — Procedures (Signed)
Routine EEG Report  [name] is a 81 y.o. female with a history of altered mental status who is undergoing an EEG to evaluate for seizures.  Report: This EEG was acquired with electrodes placed according to the International 10-20 electrode system (including Fp1, Fp2, F3, F4, C3, C4, P3, P4, O1, O2, T3, T4, T5, T6, A1, A2, Fz, Cz, Pz). The following electrodes were missing or displaced: none.  The occipital dominant rhythm was 9-10 Hz. This activity is reactive to stimulation. Drowsiness was manifested by background fragmentation; deeper stages of sleep were identified by K complexes and sleep spindles. There was no focal slowing. There were no interictal epileptiform discharges. There were no electrographic seizures identified. There was no abnormal response to photic stimulation or hyperventilation.   Impression: This EEG was obtained while awake and asleep and is normal.    Clinical Correlation: Normal EEGs, however, do not rule out epilepsy.  Su Monks, MD [facility] [phone]  If 7pm- 7am, please page neurology on call as listed in [place].

## 2021-02-21 NOTE — Progress Notes (Signed)
PT Cancellation Note  Patient Details Name: [name] MRN: [id number] DOB: [birth year]   Cancelled Treatment:     PT attempt. 2 attempts this afternoon. Currently being cleaned up after BM. Pt is planning to DC to SNF tomorrow morning. Acute PT will continue to follow and progress as able per current POC.    Willette Pa 02/21/2021, 4:45 PM

## 2021-02-21 NOTE — Plan of Care (Signed)
Pt alert and oriented x 4 at hs assessment and during most of overnight. Pt has 1 episode of slight confusion during overnight in which she was confusing her husband with her son. Pt received 1 dose of tylenol for pain and 1 dose of maalox for indigestion/gas after not relieved from crackers and ginger ale. Due to pt had slept and not ate supper. Unable to premedicate this am due to pt Tylenol dose would exceed 4000mg  in 24 hours.  Problem: Education: Goal: Knowledge of General Education information will improve Description: Including pain rating scale, medication(s)/side effects and non-pharmacologic comfort measures Outcome: Progressing   Problem: Health Behavior/Discharge Planning: Goal: Ability to manage health-related needs will improve Outcome: Progressing   Problem: Clinical Measurements: Goal: Ability to maintain clinical measurements within normal limits will improve Outcome: Progressing Goal: Will remain free from infection Outcome: Progressing Goal: Diagnostic test results will improve Outcome: Progressing Goal: Respiratory complications will improve Outcome: Progressing Goal: Cardiovascular complication will be avoided Outcome: Progressing   Problem: Activity: Goal: Risk for activity intolerance will decrease Outcome: Progressing   Problem: Nutrition: Goal: Adequate nutrition will be maintained Outcome: Progressing   Problem: Coping: Goal: Level of anxiety will decrease Outcome: Progressing   Problem: Elimination: Goal: Will not experience complications related to bowel motility Outcome: Progressing Goal: Will not experience complications related to urinary retention Outcome: Progressing   Problem: Pain Managment: Goal: General experience of comfort will improve Outcome: Progressing   Problem: Safety: Goal: Ability to remain free from injury will improve Outcome: Progressing   Problem: Skin Integrity: Goal: Risk for impaired skin integrity will  decrease Outcome: Progressing

## 2021-02-21 NOTE — Progress Notes (Signed)
Eeg done 

## 2021-02-21 NOTE — Progress Notes (Signed)
° °  Subjective: 6 Days Post-Op Procedure(s) (LRB): TOTAL KNEE REVISION (Right) Paitent doing well this am. Pain controlled. No confusion Patient is well, and has had no acute complaints or problems. Denies any CP, SOB, ABD pain. We will continue therapy today.  Plan is to go Skilled nursing facility after hospital stay.  Objective: Vital signs in last 24 hours: Temp:  [98 F (36.7 C)-98.9 F (37.2 C)] 98 F (36.7 C) (02/01 0351) Pulse Rate:  [63-68] 63 (02/01 0351) Resp:  [16-18] 18 (02/01 0351) BP: (118-157)/(46-51) 141/48 (02/01 0351) SpO2:  [92 %-97 %] 95 % (02/01 0351)  Intake/Output from previous day: 01/31 0701 - 02/01 0700 In: 480 [P.O.:480] Out: -  Intake/Output this shift: No intake/output data recorded.  Recent Labs    02/18/21 0810 02/18/21 1644 02/19/21 0434 02/20/21 1120  HGB 6.8* 8.6* 8.6* 8.7*   Recent Labs    02/19/21 0434 02/20/21 1120  WBC 11.1* 10.1  RBC 2.72* 2.74*  HCT 25.5* 25.7*  PLT 239 272   Recent Labs    02/20/21 1120  NA 137  K 3.4*  CL 104  CO2 24  BUN 19  CREATININE 1.00  GLUCOSE 116*  CALCIUM 9.0    No results for input(s): LABPT, INR in the last 72 hours.  EXAM General - Patient is Alert, Appropriate, and Oriented.  Extremity - Neurovascular intact Sensation intact distally Intact pulses distally Dorsiflexion/Plantar flexion intact Dressing - dressing C/D/I and no drainage, provena intact with out drainage Motor Function - intact, moving foot and toes well on exam.   Past Medical History:  Diagnosis Date   Anxiety    Aortic atherosclerosis (HCC)    Arthritis    Current use of long term anticoagulation    APIXABAN   Dysrhythmia    A-fib   GERD (gastroesophageal reflux disease)    History of 2019 novel coronavirus disease (COVID-19) 07/2019   History of kidney stones    Hypercholesteremia    Hypertension    IDA (iron deficiency anemia)    Paroxysmal atrial fibrillation (HCC)    Skin cancer    Skin -  squamous    Assessment/Plan:   6 Days Post-Op Procedure(s) (LRB): TOTAL KNEE REVISION (Right) Principal Problem:   S/P revision of total knee, right  Estimated body mass index is 30.36 kg/m as calculated from the following:   Height as of this encounter: 5\' 2"  (1.575 m).   Weight as of this encounter: 75.3 kg. Advance diet Up with therapy Work on BM VSS Acute post op blood loss anemia - Hgb 8.7, trending up, s/p 1 unit of PRBC Hypokalemia - 3.4, supplement with PO K Delerium - continue to monitor. DC narcotics. Neuro consulted CM to assist with discharge to SNF   DVT Prophylaxis - TED hose and SCDs, Eliquis Weight-Bearing as tolerated to right leg   T. Rachelle Hora, PA-C [place] 02/21/2021, 7:47 AM

## 2021-02-21 NOTE — TOC Progression Note (Incomplete Revision)
Transition of Care [facility]) - Progression Note    Patient Details  Name: MARSHEA WISHER MRN: [id number] Date of Birth: [birth year]  Transition of Care [facility]) CM/SW [place], RN Phone Number: 02/21/2021, 2:35 PM  Clinical Narrative:   Magda Paganini with [facility] called and stated that they would not accept the patient today because the Admission nurse has already left for the day, I notified the physician  I spoke with the son, and the patient, they have a bed offer at [facility] and would like to accept the bed offer, I resubmitted the notes to [facility] requesting to change to [facility]         Expected Discharge Plan and Services           Expected Discharge Date: 02/21/21                                     Social Determinants of Health (SDOH) Interventions    Readmission Risk Interventions No flowsheet data found.

## 2021-02-22 LAB — RESP PANEL BY RT-PCR (FLU A&B, COVID) ARPGX2
Influenza A by PCR: NEGATIVE
Influenza B by PCR: NEGATIVE
SARS Coronavirus 2 by RT PCR: NEGATIVE

## 2021-02-22 NOTE — TOC Progression Note (Signed)
Transition of Care [facility]) - Progression Note    Patient Details  Name: [name] MRN: [id number] Date of Birth: 17-Nov-1940  Transition of Care [facility]) CM/SW [place], RN Phone Number: 02/22/2021, 10:34 AM  Clinical Narrative:   patient going to room [facility] at [facility] [place] today, Son [name] aware, EMS has been called 1 ahead of her         Expected Discharge Plan and Services           Expected Discharge Date: 02/22/21                                     Social Determinants of Health (SDOH) Interventions    Readmission Risk Interventions No flowsheet data found.

## 2021-02-22 NOTE — Progress Notes (Signed)
Physical Therapy Treatment Patient Details Name: [name] MRN: [id number] DOB: [birth year] Today's Date: 02/22/2021   History of Present Illness Pt admitted for R TKR revision. History of initial TKR in 2012.    PT Comments    Pt was side lying in bed upon arriving. RN tech taking vitals. She is awake and oriented x 2. Having some word finding difficulties however able to consistently follow commands. She agrees to OOB activity. Author assisted pt OOB. Stood to [employer] and ambulated ~ 15 ft with antalgic flexed posture. Overall pt is progressing well. Recommend DC to SNF to continue to address deficits while maximizing independence with ADLs. Son at bedside post session. Breakfast ordered and call bell in reach and polar care in place.    Recommendations for follow up therapy are one component of a multi-disciplinary discharge planning process, led by the attending physician.  Recommendations may be updated based on patient status, additional functional criteria and insurance authorization.  Follow Up Recommendations  Skilled nursing-short term rehab (<3 hours/day)     Assistance Recommended at Discharge Frequent or constant Supervision/Assistance  Patient can return home with the following A little help with walking and/or transfers;A little help with bathing/dressing/bathroom;Assistance with cooking/housework;Assistance with feeding;Direct supervision/assist for medications management;Direct supervision/assist for financial management;Assist for transportation;Help with stairs or ramp for entrance   Equipment Recommendations  Other (comment) (defer to next level of care)       Precautions / Restrictions Precautions Precautions: Fall;Knee Precaution Booklet Issued: Yes (comment) Restrictions Weight Bearing Restrictions: Yes RLE Weight Bearing: Weight bearing as tolerated     Mobility  Bed Mobility Overal bed mobility: Needs Assistance Bed Mobility: Supine to Sit     Supine to  sit: Min assist     General bed mobility comments: increased time to perform with min assist to fully achieve EOB sitting    Transfers Overall transfer level: Needs assistance Equipment used: Rolling walker (2 wheels) Transfers: Sit to/from Stand Sit to Stand: Min assist, From elevated surface    General transfer comment: Pt stood from slightly elevated bed height. Vcs for handplacement, fwd wt shift, and overall improved technique and sequencing.    Ambulation/Gait Ambulation/Gait assistance: Min assist, Min guard Gait Distance (Feet): 15 Feet Assistive device: Rolling walker (2 wheels) Gait Pattern/deviations: Step-to pattern, Trunk flexed       General Gait Details: pt continues to have poor gait posture, mostly due to pain, howevere was able to ambulate with improved safety. Will need continued focus on overall gait kinematics in future PT sessions.    Balance Overall balance assessment: Needs assistance Sitting-balance support: No upper extremity supported, Feet supported Sitting balance-Leahy Scale: Good     Standing balance support: Bilateral upper extremity supported, During functional activity, Reliant on assistive device for balance Standing balance-Leahy Scale: Fair       Cognition Arousal/Alertness: Awake/alert Behavior During Therapy: WFL for tasks assessed/performed Overall Cognitive Status: Impaired/Different from baseline (Pt continues to have some word finding difficulty. Reports poor sleep previous night. Has had cognition deficits since procedure but overall seems to be improving slowly)         General Comments General comments (skin integrity, edema, etc.): Pt was repositioned in recliner with call bell in reach, polar care in place, breakfast ordered and RN aware of her abilities. Son at bedside post session.      Pertinent Vitals/Pain Pain Assessment Pain Assessment: No/denies pain Pain Score: 0-No pain     PT Goals (current goals can now  be  found in the care plan section) Acute Rehab PT Goals Patient Stated Goal: none stated Progress towards PT goals: Progressing toward goals    Frequency    BID      PT Plan Current plan remains appropriate       AM-PAC PT "6 Clicks" Mobility   Outcome Measure  Help needed turning from your back to your side while in a flat bed without using bedrails?: A Little Help needed moving from lying on your back to sitting on the side of a flat bed without using bedrails?: A Little Help needed moving to and from a bed to a chair (including a wheelchair)?: A Lot Help needed standing up from a chair using your arms (e.g., wheelchair or bedside chair)?: A Little Help needed to walk in hospital room?: A Lot Help needed climbing 3-5 steps with a railing? : A Lot 6 Click Score: 15    End of Session Equipment Utilized During Treatment: Gait belt Activity Tolerance: Patient tolerated treatment well Patient left: in chair;with call bell/phone within reach;with chair alarm set;with SCD's reapplied Nurse Communication: Mobility status PT Visit Diagnosis: Muscle weakness (generalized) (M62.81);Difficulty in walking, not elsewhere classified (R26.2);Pain Pain - Right/Left: Right Pain - part of body: Knee     Time: 2353-6144 PT Time Calculation (min) (ACUTE ONLY): 23 min  Charges:  $Therapeutic Activity: 23-37 mins                    Julaine Fusi PTA 02/22/21, 8:30 AM

## 2021-02-22 NOTE — Progress Notes (Signed)
° °  Subjective: 7 Days Post-Op Procedure(s) (LRB): TOTAL KNEE REVISION (Right) Paitent doing well this am. Pain controlled. No confusion Patient is well, and has had no acute complaints or problems. Denies any CP, SOB, ABD pain. We will continue therapy today.  Plan is to go Skilled nursing facility after hospital stay.  Objective: Vital signs in last 24 hours: Temp:  [97.5 F (36.4 C)-98.6 F (37 C)] 98.5 F (36.9 C) (02/02 0745) Pulse Rate:  [65-70] 69 (02/02 0745) Resp:  [16-18] 18 (02/02 0745) BP: (143-169)/(53-72) 157/57 (02/02 0745) SpO2:  [95 %-99 %] 97 % (02/02 0745)  Intake/Output from previous day: 02/01 0701 - 02/02 0700 In: 200 [P.O.:200] Out: 400 [Urine:400] Intake/Output this shift: No intake/output data recorded.  Recent Labs    02/20/21 1120  HGB 8.7*   Recent Labs    02/20/21 1120  WBC 10.1  RBC 2.74*  HCT 25.7*  PLT 272   Recent Labs    02/20/21 1120  NA 137  K 3.4*  CL 104  CO2 24  BUN 19  CREATININE 1.00  GLUCOSE 116*  CALCIUM 9.0    No results for input(s): LABPT, INR in the last 72 hours.  EXAM General - Patient is Alert, Appropriate, and Oriented.  Extremity - Neurovascular intact Sensation intact distally Intact pulses distally Dorsiflexion/Plantar flexion intact Dressing - dressing C/D/I and no drainage, provena intact with out drainage Motor Function - intact, moving foot and toes well on exam.   Past Medical History:  Diagnosis Date   Anxiety    Aortic atherosclerosis (HCC)    Arthritis    Current use of long term anticoagulation    APIXABAN   Dysrhythmia    A-fib   GERD (gastroesophageal reflux disease)    History of 2019 novel coronavirus disease (COVID-19) 07/2019   History of kidney stones    Hypercholesteremia    Hypertension    IDA (iron deficiency anemia)    Paroxysmal atrial fibrillation (HCC)    Skin cancer    Skin - squamous    Assessment/Plan:   7 Days Post-Op Procedure(s) (LRB): TOTAL KNEE  REVISION (Right) Principal Problem:   S/P revision of total knee, right  Estimated body mass index is 30.36 kg/m as calculated from the following:   Height as of this encounter: 5\' 2"  (1.575 m).   Weight as of this encounter: 75.3 kg. Advance diet Up with therapy Work on BM VSS Acute post op blood loss anemia - Hgb 8.7, trending up, s/p 1 unit of PRBC Delerium - Resolved. Neuro work up negative. CM to assist with discharge to SNF today   DVT Prophylaxis - TED hose and SCDs, Eliquis Weight-Bearing as tolerated to right leg   T. Rachelle Hora, PA-C [place] 02/22/2021, 10:00 AM

## 2021-02-22 NOTE — Care Management Important Message (Signed)
Important Message  Patient Details  Name: [name] MRN: [id number] Date of Birth: Nov 09, 1940   Medicare Important Message Given:  Yes     Juliann Pulse A  02/22/2021, 10:03 AM

## 2021-02-22 NOTE — Progress Notes (Signed)
[employer] was called and report was given to nurse Otila Kluver, Therapist, sports.Patient is dress and waiting on EMS to transfer.

## 2021-02-22 NOTE — TOC Progression Note (Signed)
Transition of Care [facility]) - Progression Note    Patient Details  Name: [name] MRN: [id number] Date of Birth: Apr 03, 1940  Transition [facility]) CM/SW [place], RN Phone Number: 02/22/2021, 9:49 AM  Clinical Narrative:   I called [employer] [facility] about Josem Kaufmann number [phone], spoke with Kizzie Fantasia at [facility] she stated the Josem Kaufmann is good thru today         Expected Discharge Plan and Services           Expected Discharge Date: 02/21/21                                     Social Determinants of Health (SDOH) Interventions    Readmission Risk Interventions No flowsheet data found.

## 2021-02-26 DIAGNOSIS — I10 Essential (primary) hypertension: Secondary | ICD-10-CM | POA: Diagnosis not present

## 2021-02-26 DIAGNOSIS — M1711 Unilateral primary osteoarthritis, right knee: Secondary | ICD-10-CM | POA: Diagnosis not present

## 2021-02-26 DIAGNOSIS — F05 Delirium due to known physiological condition: Secondary | ICD-10-CM | POA: Diagnosis not present

## 2021-02-26 DIAGNOSIS — K219 Gastro-esophageal reflux disease without esophagitis: Secondary | ICD-10-CM

## 2021-02-26 DIAGNOSIS — F39 Unspecified mood [affective] disorder: Secondary | ICD-10-CM

## 2021-02-26 DIAGNOSIS — I480 Paroxysmal atrial fibrillation: Secondary | ICD-10-CM | POA: Diagnosis not present

## 2021-09-27 ENCOUNTER — Other Ambulatory Visit: Payer: Self-pay | Admitting: Physician Assistant

## 2021-09-27 DIAGNOSIS — Z1231 Encounter for screening mammogram for malignant neoplasm of breast: Secondary | ICD-10-CM

## 2021-10-23 ENCOUNTER — Ambulatory Visit
Admission: RE | Admit: 2021-10-23 | Discharge: 2021-10-23 | Disposition: A | Discharge status: Home / Self Care | Payer: Medicare Other | Source: Ambulatory Visit | Attending: Physician Assistant | Admitting: Physician Assistant

## 2021-10-23 DIAGNOSIS — Z1231 Encounter for screening mammogram for malignant neoplasm of breast: Secondary | ICD-10-CM | POA: Insufficient documentation

## 2021-10-26 ENCOUNTER — Other Ambulatory Visit: Payer: Self-pay | Admitting: Physician Assistant

## 2021-10-26 DIAGNOSIS — R928 Other abnormal and inconclusive findings on diagnostic imaging of breast: Secondary | ICD-10-CM

## 2021-10-26 DIAGNOSIS — N63 Unspecified lump in unspecified breast: Secondary | ICD-10-CM

## 2021-11-20 ENCOUNTER — Encounter: Payer: Self-pay | Admitting: Student in an Organized Health Care Education/Training Program

## 2021-11-20 ENCOUNTER — Ambulatory Visit
Payer: Medicare Other | Attending: Student in an Organized Health Care Education/Training Program | Admitting: Student in an Organized Health Care Education/Training Program

## 2021-11-20 VITALS — BP 118/91 | HR 88 | Temp 97.5°F | Resp 16 | Ht 61.0 in | Wt 156.0 lb

## 2021-11-20 DIAGNOSIS — G894 Chronic pain syndrome: Secondary | ICD-10-CM | POA: Diagnosis present

## 2021-11-20 DIAGNOSIS — Z9889 Other specified postprocedural states: Secondary | ICD-10-CM | POA: Insufficient documentation

## 2021-11-20 DIAGNOSIS — Z96642 Presence of left artificial hip joint: Secondary | ICD-10-CM | POA: Diagnosis present

## 2021-11-20 DIAGNOSIS — M25561 Pain in right knee: Secondary | ICD-10-CM | POA: Insufficient documentation

## 2021-11-20 DIAGNOSIS — G8929 Other chronic pain: Secondary | ICD-10-CM | POA: Insufficient documentation

## 2021-11-20 DIAGNOSIS — Z96651 Presence of right artificial knee joint: Secondary | ICD-10-CM | POA: Insufficient documentation

## 2021-11-20 MED ORDER — PREGABALIN 25 MG PO CAPS: ORAL_CAPSULE | ORAL | 0 refills | Status: DC

## 2021-11-20 NOTE — Patient Instructions (Signed)

## 2021-11-20 NOTE — Progress Notes (Signed)
Safety precautions to be maintained throughout the outpatient stay will include: orient to surroundings, keep bed in low position, maintain call bell within reach at all times, provide assistance with transfer out of bed and ambulation.  

## 2021-11-20 NOTE — Progress Notes (Signed)
Patient: [name]  Service Category: E/M  Provider: Gillis Santa, MD  DOB: 1940/02/07  DOS: 11/20/2021  Referring Provider: Renata Caprice  MRN: [id number]  Setting: Ambulatory outpatient  PCP: Marinda Elk, MD  Type: New Patient  Specialty: Interventional Pain Management    Location: Office  Delivery: Face-to-face     Primary Reason(s) for Visit: Encounter for initial evaluation of one or more chronic problems (new to examiner) potentially causing chronic pain, and posing a threat to normal musculoskeletal function. (Level of risk: High) CC: Back Pain (lower) and Knee Pain (right)  HPI  [name] is a 81 y.o. year old, female patient, who comes for the first time to our practice referred by Duanne Guess, PA-C for our initial evaluation of her chronic pain. She has History of left hip replacement; S/P revision of total knee, right; Chronic pain of right knee; Chronic pain syndrome; and History of lumbar laminectomy for spinal cord decompression (L3-L5) Dr Cari Caraway 2022 on their problem list. Today she comes in for evaluation of her Back Pain (lower) and Knee Pain (right)  Pain Assessment: Location: Right Knee Radiating: up right thish to groin and if on feet will radiate to lower back Onset: More than a month ago Duration: Chronic pain Quality: Aching, Burning, Constant, Discomfort Severity: 7 /10 (subjective, self-reported pain score)  Effect on ADL: prolonged standing and walking, bending, driving long distance Timing: Constant Modifying factors: Tramadol,heat, ice voltren gel BP: (!) 118/91  HR: 88  Onset and Duration: Gradual and Present longer than 3 months Cause of pain:  01/2020 Severity: Getting worse, NAS-11 at its worse: 10/10, NAS-11 at its best: 0/10, NAS-11 now: 8/10, and NAS-11 on the average: 6/10 Timing: Afternoon, Night, and After activity or exercise Aggravating Factors: Prolonged standing, Surgery made it worse, and Walking Alleviating  Factors: Cold packs, Hot packs, Medications, and Sitting Associated Problems: Weakness, Pain that wakes patient up, and Pain that does not allow patient to sleep Quality of Pain: Aching, Agonizing, Burning, and Intermittent Previous Examinations or Tests: Bone scan, CT scan, MRI scan, Nerve block, X-rays, Nerve conduction test, and Orthopedic evaluation Previous Treatments: Epidural steroid injections, Narcotic medications, and Physical Therapy  [name] is a very pleasant 81 year old female who presents with a chief complaint of right knee pain.  She is status post right knee replacement in 2011 followed by revision surgery in January 2021.  She continues to have persistent and severe right knee pain.  She states that her revision surgery was rough.  She states that she had a difficult postoperative course.  She has tried a right genicular nerve block as well as radiofrequency ablation without any benefit of her right knee.  She also has a history of L3-L5 lumbar decompression.  She does not endorse any low back or radiating leg pain.  Her knee pain is most pronounced on the right lateral aspect of her patella.  She has worsening pain when she is bearing weight.  She has tried a knee brace as well as heat to the region.  She has done physical therapy in the past with limited response.  She has tried gabapentin in the past 300 mg 3 times a day which was not effective.  She has not tried pregabalin.  She does find benefit with tramadol at night.  She states that tramadol works better than hydrocodone.  She also takes acetaminophen 650 mg 3 times a day.  She is unable to take NSAIDs given that she  is on Eliquis for A-fib.  Historic Controlled Substance Pharmacotherapy Review  PMP and historical list of controlled substances: Tramadol 50 mg nightly as needed Historical Monitoring: The patient  reports no history of drug use. List of prior UDS Testing: No results found for: "MDMA", "COCAINSCRNUR", "PCPSCRNUR",  "PCPQUANT", "CANNABQUANT", "THCU", "ETH", "CBDTHCR", "D8THCCBX", "D9THCCBX" Historical Background Evaluation: [place] PMP: PDMP reviewed during this encounter. Review of the past 60-month conducted.             [place] Department of public safety, offender search: (Editor, commissioningInformation) Non-contributory Risk Assessment Profile: Aberrant behavior: None observed or detected today Risk factors for fatal opioid overdose: None identified today Fatal overdose hazard ratio (HR): Calculation deferred Non-fatal overdose hazard ratio (HR): Calculation deferred Risk of opioid abuse or dependence: 0.7-3.0% with doses ? 36 MME/day and 6.1-26% with doses ? 120 MME/day. Substance use disorder (SUD) risk level: See below Personal History of Substance Abuse (SUD-Substance use disorder):  Alcohol: Negative  Illegal Drugs: Negative  Rx Drugs:    ORT Risk Level calculation: Low Risk  Opioid Risk Tool - 11/20/21 1256       Personal History of Substance Abuse   Alcohol Negative    Illegal Drugs Negative      Psychological Disease   Psychological Disease Positive    ADD Negative    OCD Negative    Bipolar Negative    Schizophrenia Negative    Depression Positive      Total Score   Opioid Risk Tool Scoring 3    Opioid Risk Interpretation Low Risk            ORT Scoring interpretation table:  Score <3 = Low Risk for SUD  Score between 4-7 = Moderate Risk for SUD  Score >8 = High Risk for Opioid Abuse   PHQ-2 Depression Scale:  Total score:    PHQ-2 Scoring interpretation table: (Score and probability of major depressive disorder)  Score 0 = No depression  Score 1 = 15.4% Probability  Score 2 = 21.1% Probability  Score 3 = 38.4% Probability  Score 4 = 45.5% Probability  Score 5 = 56.4% Probability  Score 6 = 78.6% Probability   PHQ-9 Depression Scale:  Total score:    PHQ-9 Scoring interpretation table:  Score 0-4 = No depression  Score 5-9 = Mild depression  Score 10-14 = Moderate depression   Score 15-19 = Moderately severe depression  Score 20-27 = Severe depression (2.4 times higher risk of SUD and 2.89 times higher risk of overuse)   Pharmacologic Plan: As per protocol, I have not taken over any controlled substance management, pending the results of ordered tests and/or consults.            Initial impression: Pending review of available data and ordered tests.  Meds   Current Outpatient Medications:    acetaminophen (TYLENOL) 500 MG tablet, Take 2 tablets (1,000 mg total) by mouth every 6 (six) hours as needed for mild pain (pain score 1-3 or temp > 100.5). (Patient taking differently: Take 650 mg by mouth every 6 (six) hours as needed for moderate pain (pain score 1-3 or temp > 100.5).), Disp: 30 tablet, Rfl: 0   amLODipine (NORVASC) 5 MG tablet, Take 5 mg by mouth in the morning., Disp: , Rfl:    carboxymethylcellulose (REFRESH PLUS) 0.5 % SOLN, Place 1 drop into both eyes daily as needed (dry eyes)., Disp: , Rfl:    Cholecalciferol (VITAMIN D) 50 MCG (2000 UT) CAPS, Take 2,000  Units by mouth in the morning., Disp: , Rfl:    clindamycin (CLEOCIN) 300 MG capsule, Take 300 mg by mouth 3 (three) times daily., Disp: , Rfl:    diphenhydramine-acetaminophen (TYLENOL PM) 25-500 MG TABS tablet, Take 1 tablet by mouth at bedtime., Disp: , Rfl:    dronedarone (MULTAQ) 400 MG tablet, Take 400 mg by mouth 2 (two) times daily with a meal., Disp: , Rfl:    ELIQUIS 5 MG TABS tablet, Take 1 tablet by mouth 2 times daily. Call clinic - overdue for appt with Dr Rayann Heman. (Patient taking differently: Take 5 mg by mouth 2 (two) times daily.), Disp: 60 tablet, Rfl: 1   escitalopram (LEXAPRO) 10 MG tablet, Take 10 mg by mouth at bedtime., Disp: , Rfl:    estradiol (ESTRACE) 0.5 MG tablet, Take 0.5 mg by mouth every morning., Disp: , Rfl:    pravastatin (PRAVACHOL) 40 MG tablet, Take 40 mg by mouth at bedtime., Disp: , Rfl:    pregabalin (LYRICA) 25 MG capsule, Take 1 capsule (25 mg total) by mouth  at bedtime for 10 days, THEN 1 capsule (25 mg total) 2 (two) times daily for 20 days., Disp: 50 capsule, Rfl: 0  Imaging Review   MR SHOULDER RIGHT WO CONTRAST  Narrative CLINICAL DATA:  Right shoulder pain for 9 months.  EXAM: MRI OF THE RIGHT SHOULDER WITHOUT CONTRAST  TECHNIQUE: Multiplanar, multisequence MR imaging of the shoulder was performed. No intravenous contrast was administered.  COMPARISON:  None.  FINDINGS: Rotator cuff: Large full-thickness retracted rotator cuff tear. This involves the infraspinatus and supraspinatus tendons. The infraspinatus tendon is retracted 3 cm and the supraspinatus tendon is retracted approximately 5 cm. The subscapularis tendon is intact. Moderate tendinopathy.  Muscles: Fatty atrophy of the supraspinatus and infraspinatus muscles.  Biceps long head: Intact. Moderate tendinopathy involving the intra-articular portion.  Acromioclavicular Joint: Mild degenerative changes. Type 1-2 acromion. No lateral downsloping or undersurface spurring  Glenohumeral Joint: Mild-to-moderate degenerative changes. The humeral head is riding high in the glenoid fossa because of the rotator cuff tear. Moderate-sized joint effusion. Mild synovitis.  Labrum:  Labral degenerative changes without obvious tear.  Bones:  No acute bony findings.  Other: Expected subacromial/subdeltoid fluid.  IMPRESSION: 1. Large full-thickness retracted rotator cuff tear involving the supraspinatus and infraspinatus tendons. Associated fatty atrophy of the muscles. 2. No significant findings for bony impingement. 3. Moderate tendinopathy involving the intra-articular portion of the long head biceps tendon. 4. Mild to moderate glenohumeral joint degenerative changes.   Electronically Signed By: Marijo Sanes M.D. On: 02/21/2017 10:23  MR LUMBAR SPINE WO CONTRAST  Narrative CLINICAL DATA:  Back and bilateral hip pain.  EXAM: MRI LUMBAR SPINE WITHOUT  CONTRAST  TECHNIQUE: Multiplanar, multisequence MR imaging of the lumbar spine was performed. No intravenous contrast was administered.  COMPARISON:  02/21/2017 and prior.  FINDINGS: Segmentation:  Standard.  Alignment: Stepwise grade 1 retrolisthesis at the L2-4 level. Grade 1 L4-5, L5-S1 anterolisthesis. Grade 1 T12-L1 anterolisthesis.  Vertebrae: Modic type 2 endplate degenerative changes at the L2-4 level. Vertebral body heights are preserved. No focal osseous lesion.  Conus medullaris and cauda equina: Conus extends to the L2 level. Conus and cauda equina appear normal.  Disc levels: Multilevel desiccation.  T12-L1: Mild disc bulge and bilateral facet degenerative spurring. Patent spinal canal and neural foramen.  L1-2: Minimal disc bulge and prominent ligamentum flavum. Patent spinal canal and neural foramen.  L2-3: Disc bulge partially effacing the ventral CSF containing spaces. Prominent  ligamentum flavum and bilateral facet degenerative spurring. Moderate spinal canal, moderate left and severe right neural foraminal narrowing.  L3-4: Disc bulge, ligamentum flavum thickening and bilateral facet hypertrophy. Prominent dorsal epidural fat. Trace facet effusions. Moderate to severe spinal canal narrowing, increased since prior exam. Severe bilateral neural foraminal narrowing.  L4-5: Disc bulge and bilateral facet hypertrophy. Ligamentum flavum thickening. Right facet effusion. Anteriorly projecting 4 mm right synovial cyst (6:28). Moderate spinal canal, moderate right and mild left neural foraminal narrowing.  L5-S1: Disc bulge with shallow right foraminal protrusion. Bilateral facet hypertrophy and trace effusions. Patent spinal canal. Mild bilateral neural foraminal narrowing.  Paraspinal and other soft tissues: Negative.  IMPRESSION: Multilevel spondylosis, progressed since prior exam.  Moderate spinal canal and moderate to severe bilateral  neural foraminal narrowing at the L2-3 and L3-4 levels.  Moderate spinal canal and moderate right neural foraminal narrowing at the L4-5 level.   Electronically Signed By: Primitivo Gauze M.D. On: 01/10/2020 08:35    Narrative CLINICAL DATA:  Surgery.  EXAM: DG C-ARM 1-60 MIN; LUMBAR SPINE - 2-3 VIEW  FLUOROSCOPY TIME:  Fluoroscopy Time:  7 seconds  Number of Acquired Spot Images: 6  COMPARISON:  MRI lumbar spine 01/09/2020.  FINDINGS: Six C-arm fluoroscopic images were obtained intraoperatively and submitted for post operative interpretation. These images demonstrate surgical devices projecting in the posterior paraspinal soft tissues at both the L4-L5 and the L3-L4 levels. Please see the performing provider's procedural report for further detail.  IMPRESSION: Intraoperative fluoroscopy, as detailed above.   Electronically Signed By: Margaretha Sheffield MD On: 05/10/2020 15:29  DG HIP UNILAT W OR W/O PELVIS 2-3 VIEWS LEFT  Narrative CLINICAL DATA:  Postop hip replacement.  EXAM: DG HIP (WITH OR WITHOUT PELVIS) 2-3V LEFT  COMPARISON:  None.  FINDINGS: Left hip arthroplasty in expected alignment. There are no periprosthetic lucencies or fracture. Recent postsurgical change includes air and edema in the joint space and soft tissues. Skin staples and wound VAC in place.  IMPRESSION: Left hip arthroplasty without immediate postoperative complication.   Electronically Signed By: Keith Rake M.D. On: 06/16/2020 15:32 DG Knee 1-2 Views Right  Narrative CLINICAL DATA:  Right knee arthroplasty revision  EXAM: RIGHT KNEE - 1-2 VIEW  COMPARISON:  01/17/2011  FINDINGS: Revision arthroplasty without acute hardware complication. Expected soft tissue edema and gas anteriorly. Antibiotic spacers surround the femoral component.  IMPRESSION: Expected appearance after revision knee arthroplasty.   Electronically Signed By: Abigail Miyamoto M.D. On:  02/15/2021 11:01  Complexity Note: Imaging results reviewed.                         ROS  Cardiovascular: Abnormal heart rhythm, High blood pressure, Blood thinners:  Antiplatelet, and Needs antibiotics prior to dental procedures Pulmonary or Respiratory: Snoring  Neurological: Incontinence:  Urinary Psychological-Psychiatric: Anxiousness Gastrointestinal: Reflux or heatburn Genitourinary: Passing kidney stones Hematological: No reported hematological signs or symptoms such as prolonged bleeding, low or poor functioning platelets, bruising or bleeding easily, hereditary bleeding problems, low energy levels due to low hemoglobin or being anemic Endocrine: No reported endocrine signs or symptoms such as high or low blood sugar, rapid heart rate due to high thyroid levels, obesity or weight gain due to slow thyroid or thyroid disease Rheumatologic: Joint aches and or swelling due to excess weight (Osteoarthritis) Musculoskeletal: Negative for myasthenia gravis, muscular dystrophy, multiple sclerosis or malignant hyperthermia Work History: Retired  Allergies  Ms. Kanzler is allergic to aspirin,  nsaids, methocarbamol, morphine and related, and oxycodone.  Laboratory Chemistry Profile   Renal Lab Results  Component Value Date   BUN 19 02/20/2021   CREATININE 1.00 02/20/2021   GFRAA 38 (L) 06/19/2018   GFRNONAA 56 (L) 02/20/2021   PROTEINUR NEGATIVE 02/17/2021     Electrolytes Lab Results  Component Value Date   NA 137 02/20/2021   K 3.4 (L) 02/20/2021   CL 104 02/20/2021   CALCIUM 9.0 02/20/2021     Hepatic Lab Results  Component Value Date   AST 13 (L) 02/20/2021   ALT 11 02/20/2021   ALBUMIN 2.9 (L) 02/20/2021   ALKPHOS 58 02/20/2021   AMMONIA <10 02/20/2021     ID Lab Results  Component Value Date   SARSCOV2NAA NEGATIVE 02/22/2021   STAPHAUREUS NEGATIVE 02/02/2021   MRSAPCR NEGATIVE 02/02/2021     Bone No results found for: "VD25OH", "VD125OH2TOT", "YI9485IO2",  "VO3500XF8", "25OHVITD1", "25OHVITD2", "25OHVITD3", "TESTOFREE", "TESTOSTERONE"   Endocrine Lab Results  Component Value Date   GLUCOSE 116 (H) 02/20/2021   GLUCOSEU NEGATIVE 02/17/2021   TSH 4.342 02/20/2021     Neuropathy Lab Results  Component Value Date   VITAMINB12 1,637 (H) 02/20/2021     CNS No results found for: "COLORCSF", "APPEARCSF", "RBCCOUNTCSF", "WBCCSF", "POLYSCSF", "LYMPHSCSF", "EOSCSF", "PROTEINCSF", "GLUCCSF", "JCVIRUS", "CSFOLI", "IGGCSF", "LABACHR", "ACETBL"   Inflammation (CRP: Acute  ESR: Chronic) No results found for: "CRP", "ESRSEDRATE", "LATICACIDVEN"   Rheumatology No results found for: "RF", "ANA", "LABURIC", "URICUR", "LYMEIGGIGMAB", "LYMEABIGMQN", "HLAB27"   Coagulation Lab Results  Component Value Date   INR 1.7 (H) 05/03/2020   LABPROT 20.3 (H) 05/03/2020   APTT 39 (H) 05/03/2020   PLT 272 02/20/2021     Cardiovascular Lab Results  Component Value Date   HGB 8.7 (L) 02/20/2021   HCT 25.7 (L) 02/20/2021     Screening Lab Results  Component Value Date   SARSCOV2NAA NEGATIVE 02/22/2021   STAPHAUREUS NEGATIVE 02/02/2021   MRSAPCR NEGATIVE 02/02/2021     Cancer No results found for: "CEA", "CA125", "LABCA2"   Allergens No results found for: "ALMOND", "APPLE", "ASPARAGUS", "AVOCADO", "BANANA", "BARLEY", "BASIL", "BAYLEAF", "GREENBEAN", "LIMABEAN", "WHITEBEAN", "BEEFIGE", "REDBEET", "BLUEBERRY", "BROCCOLI", "CABBAGE", "MELON", "CARROT", "CASEIN", "CASHEWNUT", "CAULIFLOWER", "CELERY"     Note: Lab results reviewed.  [place]  Drug: Ms. Everly  reports no history of drug use. Alcohol:  reports no history of alcohol use. Tobacco:  reports that she has never smoked. She has never used smokeless tobacco. Medical:  has a past medical history of Anxiety, Aortic atherosclerosis ([place]), Arthritis, Current use of long term anticoagulation, Dysrhythmia, GERD (gastroesophageal reflux disease), History of 2019 novel coronavirus disease (COVID-19)  (07/2019), History of kidney stones, Hypercholesteremia, Hypertension, IDA (iron deficiency anemia), Paroxysmal atrial fibrillation ([place]), and Skin cancer. Family: family history includes CAD in her father; Dementia in her mother; Heart attack in her father.  Past Surgical History:  Procedure Laterality Date   ABDOMINAL HYSTERECTOMY     BREAST CYST ASPIRATION Left 11/21/2015   resolved   BREAST EXCISIONAL BIOPSY Left 1973   benign   CATARACT EXTRACTION W/PHACO Right 07/13/2014   Procedure: CATARACT EXTRACTION PHACO AND INTRAOCULAR LENS PLACEMENT (IOC);  Surgeon: Leandrew Koyanagi, MD;  Location: [facility];  Service: Ophthalmology;  Laterality: Right;   CATARACT EXTRACTION W/PHACO Left 10/05/2014   Procedure: CATARACT EXTRACTION PHACO AND INTRAOCULAR LENS PLACEMENT (IOC);  Surgeon: Leandrew Koyanagi, MD;  Location: [facility];  Service: Ophthalmology;  Laterality: Left;   COLONOSCOPY     JOINT REPLACEMENT  knee replacement right   LUMBAR LAMINECTOMY/DECOMPRESSION MICRODISCECTOMY N/A 05/10/2020   Procedure: L3-5 DECOMPRESSION;  Surgeon: Meade Maw, MD;  Location: [facility];  Service: Neurosurgery;  Laterality: N/A;   SEPTOPLASTY     TOTAL HIP ARTHROPLASTY Left 06/16/2020   Procedure: TOTAL HIP ARTHROPLASTY ANTERIOR APPROACH;  Surgeon: Hessie Knows, MD;  Location: [facility];  Service: Orthopedics;  Laterality: Left;   TOTAL KNEE REVISION Right 02/15/2021   Procedure: TOTAL KNEE REVISION;  Surgeon: Hessie Knows, MD;  Location: [facility];  Service: Orthopedics;  Laterality: Right;   Active Ambulatory Problems    Diagnosis Date Noted   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   S/P revision of total knee, 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   Chronic pain of right knee 11/20/2021   Chronic pain syndrome 11/20/2021   History of lumbar laminectomy for spinal cord decompression (L3-L5) Dr Cari Caraway 2022 11/20/2021   Resolved Ambulatory Problems    Diagnosis Date Noted   No  Resolved Ambulatory Problems   Past Medical History:  Diagnosis Date   Anxiety    Aortic atherosclerosis ([place])    Arthritis    Current use of long term anticoagulation    Dysrhythmia    GERD (gastroesophageal reflux disease)    History of 2019 novel coronavirus disease (COVID-19) 07/2019   History of kidney stones    Hypercholesteremia    Hypertension    IDA (iron deficiency anemia)    Paroxysmal atrial fibrillation ([facility])    Skin cancer    Constitutional Exam  General appearance: Well nourished, well developed, and well hydrated. In no apparent acute distress Vitals:   11/20/21 1246  BP: (!) 118/91  Pulse: 88  Resp: 16  Temp: (!) 97.5 F (36.4 C)  SpO2: 99%  Weight: 156 lb (70.8 kg)  Height: _0  (1.549 m)   BMI Assessment: Estimated body mass index is 29.48 kg/m as calculated from the following:   Height as of this encounter: _1  (1.549 m).   Weight as of this encounter: 156 lb (70.8 kg).  BMI interpretation table: BMI level Category Range association with higher incidence of chronic pain  <18 kg/m2 Underweight   18.5-24.9 kg/m2 Ideal body weight   25-29.9 kg/m2 Overweight Increased incidence by 20%  30-34.9 kg/m2 Obese (Class I) Increased incidence by 68%  35-39.9 kg/m2 Severe obesity (Class II) Increased incidence by 136%  >40 kg/m2 Extreme obesity (Class III) Increased incidence by 254%   Patient's current BMI Ideal Body weight  Body mass index is 29.48 kg/m. Ideal body weight: 47.8 kg (105 lb 6.1 oz) Adjusted ideal body weight: 57 kg (125 lb 10 oz)   BMI Readings from Last 4 Encounters:  11/20/21 29.48 kg/m  02/16/21 30.36 kg/m  06/16/20 32.30 kg/m  06/14/20 32.30 kg/m   Wt Readings from Last 4 Encounters:  11/20/21 156 lb (70.8 kg)  02/16/21 166 lb (75.3 kg)  06/16/20 176 lb 9.4 oz (80.1 kg)  06/14/20 176 lb 9.4 oz (80.1 kg)    Psych/Mental status: Alert, oriented x 3 (person, place, & time)       Eyes: PERLA Respiratory: No evidence of  acute respiratory distress  Lumbar Spine Area Exam  Skin & Axial Inspection: Well healed scar from previous spine surgery detected Alignment: Symmetrical Functional ROM: Pain restricted ROM       Stability: No instability detected Muscle Tone/Strength: Functionally intact. No obvious neuro-muscular anomalies detected. Sensory (Neurological): Musculoskeletal pain pattern  Gait & Posture Assessment  Ambulation: Unassisted Gait: Antalgic gait (limping) Posture:  WNL  Lower Extremity Exam    Side: Right lower extremity  Side: Left lower extremity  Stability: No instability observed          Stability: No instability observed          Skin & Extremity Inspection: Skin color, temperature, and hair growth are WNL. No peripheral edema or cyanosis. No masses, redness, swelling, asymmetry, or associated skin lesions. No contractures.  Skin & Extremity Inspection: Skin color, temperature, and hair growth are WNL. No peripheral edema or cyanosis. No masses, redness, swelling, asymmetry, or associated skin lesions. No contractures.  Functional ROM: Pain restricted ROM for hip and knee joints          Functional ROM: Unrestricted ROM                  Muscle Tone/Strength: Functionally intact. No obvious neuro-muscular anomalies detected.  Muscle Tone/Strength: Functionally intact. No obvious neuro-muscular anomalies detected.  Sensory (Neurological): Arthropathic arthralgia        Sensory (Neurological): Unimpaired        DTR: Patellar: deferred today Achilles: deferred today Plantar: deferred today  DTR: Patellar: deferred today Achilles: deferred today Plantar: deferred today  Palpation: No palpable anomalies  Palpation: No palpable anomalies    Assessment  Primary Diagnosis & Pertinent Problem List: The primary encounter diagnosis was Chronic knee pain after total replacement of right knee joint. Diagnoses of Chronic pain of right knee, History of left hip replacement, History of lumbar  laminectomy for spinal cord decompression (L3-L5) Dr Cari Caraway 2022, and Chronic pain syndrome were also pertinent to this visit.  Visit Diagnosis (New problems to examiner): 1. Chronic knee pain after total replacement of right knee joint   2. Chronic pain of right knee   3. History of left hip replacement   4. History of lumbar laminectomy for spinal cord decompression (L3-L5) Dr Cari Caraway 2022   5. Chronic pain syndrome    Plan of Care (Initial workup plan)  Note: Ms. Muldoon was reminded that as per protocol, today's visit has been an evaluation only. We have not taken over the patient's controlled substance management.  Lab Orders         Compliance Drug Analysis, Ur      Pharmacotherapy (current): Medications ordered:  Meds ordered this encounter  Medications   pregabalin (LYRICA) 25 MG capsule    Sig: Take 1 capsule (25 mg total) by mouth at bedtime for 10 days, THEN 1 capsule (25 mg total) 2 (two) times daily for 20 days.    Dispense:  50 capsule    Refill:  0   Medications administered during this visit: Glennon Mac had no medications administered during this visit.   Analgesic Pharmacotherapy:  Opioid Analgesics: We will consider trial of tramadol ER, buprenorphine  Membrane stabilizer:  Failed gabapentin.  Trial of Lyrica as above.  Muscle relaxant: To be determined at a later time  NSAID: Medically contraindicated patient on Eliquis for A-fib  Other analgesic(s): To be determined at a later time    Provider-requested follow-up: Return in about 9 days (around 11/29/2021) for Medication Management, in person.  I spent a total of 60 minutes reviewing chart data, face-to-face evaluation with the patient, counseling and coordination of care as detailed above.   Future Appointments  Date Time Provider [place]  11/27/2021 11:00 AM [facility]  11/27/2021 11:20 AM [facility]US 1 [facility]  11/29/2021  9:00 AM Gillis Santa, MD [facility]  None    Note by: Gillis Santa, MD Date: 11/20/2021; Time: 3:16 PM

## 2021-11-21 ENCOUNTER — Telehealth: Payer: Self-pay | Admitting: Student in an Organized Health Care Education/Training Program

## 2021-11-21 NOTE — Telephone Encounter (Signed)
Called patient back.  No answer.  LVM.

## 2021-11-21 NOTE — Telephone Encounter (Signed)
Patient is concerned about taking Lyrica. Her son looked it up on the web and it says it is similar to gabapentin. She had problems with gabapentin.  She would like someone to call her

## 2021-11-23 LAB — COMPLIANCE DRUG ANALYSIS, UR

## 2021-11-27 ENCOUNTER — Ambulatory Visit
Admission: RE | Admit: 2021-11-27 | Discharge: 2021-11-27 | Disposition: A | Discharge status: Home / Self Care | Payer: Medicare Other | Source: Ambulatory Visit | Attending: Physician Assistant | Admitting: Physician Assistant

## 2021-11-27 DIAGNOSIS — N63 Unspecified lump in unspecified breast: Secondary | ICD-10-CM | POA: Insufficient documentation

## 2021-11-27 DIAGNOSIS — R928 Other abnormal and inconclusive findings on diagnostic imaging of breast: Secondary | ICD-10-CM

## 2021-11-29 ENCOUNTER — Encounter: Payer: Self-pay | Admitting: Student in an Organized Health Care Education/Training Program

## 2021-11-29 ENCOUNTER — Ambulatory Visit
Payer: Medicare Other | Attending: Student in an Organized Health Care Education/Training Program | Admitting: Student in an Organized Health Care Education/Training Program

## 2021-11-29 VITALS — BP 126/48 | HR 107 | Temp 97.4°F | Resp 14 | Ht 61.0 in | Wt 156.0 lb

## 2021-11-29 DIAGNOSIS — Z96651 Presence of right artificial knee joint: Secondary | ICD-10-CM

## 2021-11-29 DIAGNOSIS — M25561 Pain in right knee: Secondary | ICD-10-CM

## 2021-11-29 DIAGNOSIS — G894 Chronic pain syndrome: Secondary | ICD-10-CM | POA: Diagnosis present

## 2021-11-29 DIAGNOSIS — Z9889 Other specified postprocedural states: Secondary | ICD-10-CM | POA: Diagnosis present

## 2021-11-29 DIAGNOSIS — G8929 Other chronic pain: Secondary | ICD-10-CM

## 2021-11-29 MED ORDER — PREGABALIN 25 MG PO CAPS: 25.0000 mg | ORAL_CAPSULE | Freq: Two times a day (BID) | ORAL | 2 refills | Status: DC

## 2021-11-29 NOTE — Assessment & Plan Note (Signed)
Patient finding excellent analgesic response to Lyrica.  Instructed her to continue with titration.

## 2021-11-29 NOTE — Progress Notes (Signed)
PROVIDER NOTE: Information contained herein reflects review and annotations entered in association with encounter. Interpretation of such information and data should be left to medically-trained personnel. Information provided to patient can be located elsewhere in the medical record under "Patient Instructions". Document created using STT-dictation technology, any transcriptional errors that may result from process are unintentional.    Patient: [name]  Service Category: E/M  Provider: Gillis Santa, MD  DOB: 01/20/1941  DOS: 11/29/2021  Referring Provider: Marinda Elk, MD  MRN: [id number]  Specialty: Interventional Pain Management  PCP: Marinda Elk, MD  Type: Established Patient  Setting: Ambulatory outpatient    Location: Office  Delivery: Face-to-face     HPI  [name], a 81 y.o. year old female, is here today because of her Chronic knee pain after total replacement of right knee joint [M25.561, G89.29, Z96.651]. [name] primary complain today is Knee Pain (right) Last encounter: My last encounter with her was on 11/21/2021. Pertinent problems: [name] has History of left hip replacement; S/P revision of total knee, right; Chronic pain of right knee; Chronic pain syndrome; and History of lumbar laminectomy for spinal cord decompression (L3-L5) Dr Cari Caraway 2022 on their pertinent problem list. Pain Assessment: Severity of Chronic pain is reported as a 3 /10. Location: Knee Right/right upper leg inito the groin. Onset: More than a month ago. Quality: Aching, Dull. Timing: Intermittent. Modifying factor(s): Tramadol, heat, ice, Voltaren Gel, Tylenol,Lyrica. Vitals:  height is _0  (1.549 m) and weight is 156 lb (70.8 kg). Her temporal temperature is 97.4 F (36.3 C) (abnormal). Her blood pressure is 126/48 (abnormal) and her pulse is 107 (abnormal). Her respiration is 14 and oxygen saturation is 100%.   Reason for encounter: medication management.    Excellent analgesic benefit with Lyrica at 25 mg qhs, will titrate up to 25 mg BID next week.   HPI from initial clinic visit: 11/20/21 [name] is a very pleasant 81 year old female who presents with a chief complaint of right knee pain.  She is status post right knee replacement in 2011 followed by revision surgery in January 2021.  She continues to have persistent and severe right knee pain.  She states that her revision surgery was rough.  She states that she had a difficult postoperative course.  She has tried a right genicular nerve block as well as radiofrequency ablation without any benefit of her right knee.  She also has a history of L3-L5 lumbar decompression.  She does not endorse any low back or radiating leg pain.  Her knee pain is most pronounced on the right lateral aspect of her patella.  She has worsening pain when she is bearing weight.  She has tried a knee brace as well as heat to the region.  She has done physical therapy in the past with limited response.  She has tried gabapentin in the past 300 mg 3 times a day which was not effective.  She has not tried pregabalin.  She does find benefit with tramadol at night.  She states that tramadol works better than hydrocodone.  She also takes acetaminophen 650 mg 3 times a day.  She is unable to take NSAIDs given that she is on Eliquis for A-fib.   Monitoring: [name] PMP: PDMP reviewed during this encounter.       Pharmacotherapy: No side-effects or adverse reactions reported. Compliance: No problems identified. Effectiveness: Clinically acceptable.  No notes on file  No results found for: "CBDTHCR" No results  found for: "D8THCCBX" No results found for: "D9THCCBX"  UDS:  Summary  Date Value Ref Range Status  11/21/2021 Note  Final    Comment:    ==================================================================== Compliance Drug Analysis, Ur ==================================================================== Test                              Result       Flag       Units  Drug Present and Declared for Prescription Verification   Pregabalin                     PRESENT      EXPECTED   Citalopram                     PRESENT      EXPECTED   Desmethylcitalopram            PRESENT      EXPECTED    Desmethylcitalopram is an expected metabolite of citalopram or the    enantiomeric form, escitalopram.    Acetaminophen                  PRESENT      EXPECTED  Drug Present not Declared for Prescription Verification   Tramadol                       >7937        UNEXPECTED ng/mg creat   O-Desmethyltramadol            7573         UNEXPECTED ng/mg creat   N-Desmethyltramadol            4151         UNEXPECTED ng/mg creat    Source of tramadol is a prescription medication. O-desmethyltramadol    and N-desmethyltramadol are expected metabolites of tramadol.  Drug Absent but Declared for Prescription Verification   Diphenhydramine                Not Detected UNEXPECTED ==================================================================== Test                      Result    Flag   Units      Ref Range   Creatinine              63               mg/dL      >=20 ==================================================================== Declared Medications:  The flagging and interpretation on this report are based on the  following declared medications.  Unexpected results may arise from  inaccuracies in the declared medications.   **Note: The testing scope of this panel includes these medications:   Diphenhydramine (Tylenol PM)  Escitalopram (Lexapro)  Pregabalin (Lyrica)   **Note: The testing scope of this panel does not include small to  moderate amounts of these reported medications:   Acetaminophen (Tylenol)  Acetaminophen (Tylenol PM)   **Note: The testing scope of this panel does not include the  following reported medications:   Amlodipine (Norvasc)  Apixaban (Eliquis)  Clindamycin (Cleocin)  Dronedarone (Multaq)   Estradiol (Estrace)  Eye Drops  Pravastatin (Pravachol)  Vitamin D ==================================================================== For clinical consultation, please call [phone]. ====================================================================       ROS  Constitutional: Denies any fever or chills Gastrointestinal: No reported hemesis, hematochezia, vomiting, or acute GI distress Musculoskeletal:  Right  knee pain Neurological: No reported episodes of acute onset apraxia, aphasia, dysarthria, agnosia, amnesia, paralysis, loss of coordination, or loss of consciousness  Medication Review  Vitamin D, acetaminophen, amLODipine, apixaban, carboxymethylcellulose, clindamycin, diphenhydramine-acetaminophen, dronedarone, escitalopram, estradiol, pravastatin, and pregabalin  History Review  Allergy: [name] is allergic to aspirin, nsaids, methocarbamol, morphine and related, and oxycodone. Drug: [name]  reports no history of drug use. Alcohol:  reports no history of alcohol use. Tobacco:  reports that she has never smoked. She has never used smokeless tobacco. Social: [name]  reports that she has never smoked. She has never used smokeless tobacco. She reports that she does not drink alcohol and does not use drugs. Medical:  has a past medical history of Anxiety, Aortic atherosclerosis ([place]), Arthritis, Current use of long term anticoagulation, Dysrhythmia, GERD (gastroesophageal reflux disease), History of 2019 novel coronavirus disease (COVID-19) (07/2019), History of kidney stones, Hypercholesteremia, Hypertension, IDA (iron deficiency anemia), Paroxysmal atrial fibrillation ([place]), and Skin cancer. Surgical: [name]  has a past surgical history that includes Abdominal hysterectomy; Septoplasty; Cataract extraction w/PHACO (Right, 07/13/2014); Cataract extraction w/PHACO (Left, 10/05/2014); Breast excisional biopsy (Left, 1973); Lumbar laminectomy/decompression  microdiscectomy (N/A, 05/10/2020); Breast cyst aspiration (Left, 11/21/2015); Joint replacement; Total hip arthroplasty (Left, 06/16/2020); Colonoscopy; and Total knee revision (Right, 02/15/2021). Family: family history includes CAD in her father; Dementia in her mother; Heart attack in her father.  Laboratory Chemistry Profile   Renal Lab Results  Component Value Date   BUN 19 02/20/2021   CREATININE 1.00 02/20/2021   GFRAA 38 (L) 06/19/2018   GFRNONAA 56 (L) 02/20/2021    Hepatic Lab Results  Component Value Date   AST 13 (L) 02/20/2021   ALT 11 02/20/2021   ALBUMIN 2.9 (L) 02/20/2021   ALKPHOS 58 02/20/2021   AMMONIA <10 02/20/2021    Electrolytes Lab Results  Component Value Date   NA 137 02/20/2021   K 3.4 (L) 02/20/2021   CL 104 02/20/2021   CALCIUM 9.0 02/20/2021    Bone No results found for: "VD25OH", "VD125OH2TOT", "BF3832NV9", "TY6060OK5", "25OHVITD1", "25OHVITD2", "25OHVITD3", "TESTOFREE", "TESTOSTERONE"  Inflammation (CRP: Acute Phase) (ESR: Chronic Phase) No results found for: "CRP", "ESRSEDRATE", "LATICACIDVEN"       Note: Above Lab results reviewed.  Recent Imaging Review  US BREAST LTD UNI RIGHT INC AXILLA CLINICAL DATA:  Callback for RIGHT breast mass. Patient is on hormone replacement therapy.  EXAM: DIGITAL DIAGNOSTIC UNILATERAL RIGHT MAMMOGRAM WITH TOMOSYNTHESIS; ULTRASOUND RIGHT BREAST LIMITED  TECHNIQUE: Right digital diagnostic mammography and breast tomosynthesis was performed.; Targeted ultrasound examination of the right breast was performed  COMPARISON:  Previous exam(s).  ACR Breast Density Category c: The breast tissue is heterogeneously dense, which may obscure small masses.  FINDINGS: Spot compression tomosynthesis views confirm persistence of an oval circumscribed mass in the RIGHT lower breast at middle depth. No additional suspicious findings are noted.  On physical exam, no suspicious mass is appreciated.  Targeted  ultrasound was performed of the RIGHT lower breast. At 6 o'clock 3 cm from the nipple, there is an oval circumscribed anechoic mass with posterior acoustic enhancement. It measures 18 x 9 by 19 mm and is consistent with a benign simple cyst. This corresponds to the site of screening mammographic concern. An additional benign cyst is incidentally noted adjacent to this dominant cyst which measures 6 x 7 x 4 mm.  IMPRESSION: There is a benign cyst at the site of screening mammographic concern.  RECOMMENDATION: Screening mammogram in one year.(Code:SM-B-01Y)  I  have discussed the findings and recommendations with the patient. If applicable, a reminder letter will be sent to the patient regarding the next appointment.  BI-RADS CATEGORY  2: Benign.  Electronically Signed   By: Valentino Saxon M.D.   On: 11/27/2021 11:48 MM DIAG BREAST TOMO UNI RIGHT CLINICAL DATA:  Callback for RIGHT breast mass. Patient is on hormone replacement therapy.  EXAM: DIGITAL DIAGNOSTIC UNILATERAL RIGHT MAMMOGRAM WITH TOMOSYNTHESIS; ULTRASOUND RIGHT BREAST LIMITED  TECHNIQUE: Right digital diagnostic mammography and breast tomosynthesis was performed.; Targeted ultrasound examination of the right breast was performed  COMPARISON:  Previous exam(s).  ACR Breast Density Category c: The breast tissue is heterogeneously dense, which may obscure small masses.  FINDINGS: Spot compression tomosynthesis views confirm persistence of an oval circumscribed mass in the RIGHT lower breast at middle depth. No additional suspicious findings are noted.  On physical exam, no suspicious mass is appreciated.  Targeted ultrasound was performed of the RIGHT lower breast. At 6 o'clock 3 cm from the nipple, there is an oval circumscribed anechoic mass with posterior acoustic enhancement. It measures 18 x 9 by 19 mm and is consistent with a benign simple cyst. This corresponds to the site of screening  mammographic concern. An additional benign cyst is incidentally noted adjacent to this dominant cyst which measures 6 x 7 x 4 mm.  IMPRESSION: There is a benign cyst at the site of screening mammographic concern.  RECOMMENDATION: Screening mammogram in one year.(Code:SM-B-01Y)  I have discussed the findings and recommendations with the patient. If applicable, a reminder letter will be sent to the patient regarding the next appointment.  BI-RADS CATEGORY  2: Benign.  Electronically Signed   By: Valentino Saxon M.D.   On: 11/27/2021 11:48 Note: Reviewed        Physical Exam  General appearance: Well nourished, well developed, and well hydrated. In no apparent acute distress Mental status: Alert, oriented x 3 (person, place, & time)       Respiratory: No evidence of acute respiratory distress Eyes: PERLA Vitals: BP (!) 126/48   Pulse (!) 107   Temp (!) 97.4 F (36.3 C) (Temporal)   Resp 14   Ht _0  (1.549 m)   Wt 156 lb (70.8 kg)   SpO2 100%   BMI 29.48 kg/m  BMI: Estimated body mass index is 29.48 kg/m as calculated from the following:   Height as of this encounter: _1  (1.549 m).   Weight as of this encounter: 156 lb (70.8 kg). Ideal: Ideal body weight: 47.8 kg (105 lb 6.1 oz) Adjusted ideal body weight: 57 kg (125 lb 10 oz)   Lumbar Spine Area Exam  Skin & Axial Inspection: Well healed scar from previous spine surgery detected Alignment: Symmetrical Functional ROM: Pain restricted ROM       Stability: No instability detected Muscle Tone/Strength: Functionally intact. No obvious neuro-muscular anomalies detected. Sensory (Neurological): Musculoskeletal pain pattern   Gait & Posture Assessment  Ambulation: Unassisted Gait: Antalgic gait (limping) Posture: WNL  Lower Extremity Exam      Side: Right lower extremity   Side: Left lower extremity  Stability: No instability observed           Stability: No instability observed          Skin & Extremity  Inspection: Skin color, temperature, and hair growth are WNL. No peripheral edema or cyanosis. No masses, redness, swelling, asymmetry, or associated skin lesions. No contractures.   Skin & Extremity Inspection: Skin color,  temperature, and hair growth are WNL. No peripheral edema or cyanosis. No masses, redness, swelling, asymmetry, or associated skin lesions. No contractures.  Functional ROM: Pain restricted ROM for hip and knee joints           Functional ROM: Unrestricted ROM                  Muscle Tone/Strength: Functionally intact. No obvious neuro-muscular anomalies detected.   Muscle Tone/Strength: Functionally intact. No obvious neuro-muscular anomalies detected.  Sensory (Neurological): Arthropathic arthralgia         Sensory (Neurological): Unimpaired        DTR: Patellar: deferred today Achilles: deferred today Plantar: deferred today   DTR: Patellar: deferred today Achilles: deferred today Plantar: deferred today  Palpation: No palpable anomalies   Palpation: No palpable anomalies       Assessment   Diagnosis Status  1. Chronic knee pain after total replacement of right knee joint   2. Chronic pain of right knee   3. History of lumbar laminectomy for spinal cord decompression (L3-L5) Dr Cari Caraway 2022   4. Chronic pain syndrome    Controlled Controlled Controlled   Updated Problems: Problem  History of lumbar laminectomy for spinal cord decompression (L3-L5) Dr Cari Caraway 2022     Plan of Care  Problem-specific:  History of lumbar laminectomy for spinal cord decompression (L3-L5) Dr Cari Caraway 2022 Patient finding excellent analgesic response to Lyrica.  Instructed her to continue with titration.  [name] has a current medication list which includes the following long-term medication(s): amlodipine, dronedarone, eliquis, escitalopram, estradiol, pravastatin, and [START ON 12/20/2021] pregabalin.  Pharmacotherapy (Medications Ordered): Meds ordered  this encounter  Medications   pregabalin (LYRICA) 25 MG capsule    Sig: Take 1 capsule (25 mg total) by mouth 2 (two) times daily.    Dispense:  60 capsule    Refill:  2   Orders:  No orders of the defined types were placed in this encounter.  Follow-up plan:   Return in about 3 months (around 03/01/2022) for Medication Management, in person.     Analgesic Pharmacotherapy:  Opioid Analgesics: consider trial of tramadol ER, buprenorphine  Membrane stabilizer:  Failed gabapentin.  Trial of Lyrica as above, excellent response thus far.  Muscle relaxant: To be determined at a later time  NSAID: Medically contraindicated patient on Eliquis for A-fib  Other analgesic(s): To be determined at a later time     Recent Visits Date Type Provider Dept  11/20/21 Office Visit Gillis Santa, MD [facility]  Showing recent visits within past 90 days and meeting all other requirements Today's Visits Date Type Provider Dept  11/29/21 Office Visit Gillis Santa, MD [facility]  Showing today's visits and meeting all other requirements Future Appointments Date Type Provider Dept  02/21/22 Appointment Gillis Santa, MD [facility]  Showing future appointments within next 90 days and meeting all other requirements  I discussed the assessment and treatment plan with the patient. The patient was provided an opportunity to ask questions and all were answered. The patient agreed with the plan and demonstrated an understanding of the instructions.  Patient advised to call back or seek an in-person evaluation if the symptoms or condition worsens.  Duration of encounter: 56mnutes.  Total time on encounter, as per AMA guidelines included both the face-to-face and non-face-to-face time personally spent by the physician and/or other qualified health care professional(s) on the day of the encounter (includes time in activities  that require the physician or other qualified health  care professional and does not include time in activities normally performed by clinical staff). Physician's time may include the following activities when performed: preparing to see the patient (eg, review of tests, pre-charting review of records) obtaining and/or reviewing separately obtained history performing a medically appropriate examination and/or evaluation counseling and educating the patient/family/caregiver ordering medications, tests, or procedures referring and communicating with other health care professionals (when not separately reported) documenting clinical information in the electronic or other health record independently interpreting results (not separately reported) and communicating results to the patient/ family/caregiver care coordination (not separately reported)  Note by: Gillis Santa, MD Date: 11/29/2021; Time: 9:45 AM

## 2021-12-17 ENCOUNTER — Encounter: Payer: Self-pay | Admitting: Student in an Organized Health Care Education/Training Program

## 2022-01-23 ENCOUNTER — Encounter: Payer: Self-pay | Admitting: Student in an Organized Health Care Education/Training Program

## 2022-02-12 ENCOUNTER — Other Ambulatory Visit: Payer: Self-pay | Admitting: Student in an Organized Health Care Education/Training Program

## 2022-02-21 ENCOUNTER — Ambulatory Visit
Admission: RE | Admit: 2022-02-21 | Discharge: 2022-02-21 | Disposition: A | Discharge status: Home / Self Care | Payer: Medicare Other | Source: Ambulatory Visit | Attending: Student in an Organized Health Care Education/Training Program | Admitting: Student in an Organized Health Care Education/Training Program

## 2022-02-21 ENCOUNTER — Ambulatory Visit (HOSPITAL_BASED_OUTPATIENT_CLINIC_OR_DEPARTMENT_OTHER): Payer: Medicare Other | Admitting: Student in an Organized Health Care Education/Training Program

## 2022-02-21 ENCOUNTER — Encounter: Payer: Self-pay | Admitting: Student in an Organized Health Care Education/Training Program

## 2022-02-21 VITALS — BP 156/53 | HR 67 | Temp 98.2°F | Resp 16 | Ht 61.0 in | Wt 158.0 lb

## 2022-02-21 DIAGNOSIS — M5416 Radiculopathy, lumbar region: Secondary | ICD-10-CM | POA: Insufficient documentation

## 2022-02-21 DIAGNOSIS — M961 Postlaminectomy syndrome, not elsewhere classified: Secondary | ICD-10-CM | POA: Insufficient documentation

## 2022-02-21 DIAGNOSIS — M533 Sacrococcygeal disorders, not elsewhere classified: Secondary | ICD-10-CM

## 2022-02-21 DIAGNOSIS — G894 Chronic pain syndrome: Secondary | ICD-10-CM | POA: Insufficient documentation

## 2022-02-21 DIAGNOSIS — G8929 Other chronic pain: Secondary | ICD-10-CM

## 2022-02-21 DIAGNOSIS — Z9889 Other specified postprocedural states: Secondary | ICD-10-CM

## 2022-02-21 DIAGNOSIS — Z96642 Presence of left artificial hip joint: Secondary | ICD-10-CM | POA: Insufficient documentation

## 2022-02-21 MED ORDER — HYDROCODONE-ACETAMINOPHEN 5-325 MG PO TABS: 1.0000 | ORAL_TABLET | Freq: Two times a day (BID) | ORAL | 0 refills | Status: AC | PRN

## 2022-02-21 NOTE — Patient Instructions (Signed)
_______________________________________________________________________  Medication Rules  Purpose: To inform patients, and their family members, of our medication rules and regulations.  Applies to: All patients receiving prescriptions from our practice (written or electronic).  Pharmacy of record: This is the pharmacy where your electronic prescriptions will be sent. Make sure we have the correct one.  Electronic prescriptions: In compliance with the [place] Strengthen Opioid Misuse Prevention (STOP) Act of 2017 (Session Law 2017-74/H243), effective January 21, 2018, all controlled substances must be electronically prescribed. Written prescriptions, faxing, or calling prescriptions to a pharmacy will no longer be done.  Prescription refills: These will be provided only during in-person appointments. No medications will be renewed without a "face-to-face" evaluation with your provider. Applies to all prescriptions.  NOTE: The following applies primarily to controlled substances (Opioid* Pain Medications).   Type of encounter (visit): For patients receiving controlled substances, face-to-face visits are required. (Not an option and not up to the patient.)  Patient's responsibilities: Pain Pills: Bring all pain pills to every appointment (except for procedure appointments). Pill Bottles: Bring pills in original pharmacy bottle. Bring bottle, even if empty. Always bring the bottle of the most recent fill.  Medication refills: You are responsible for knowing and keeping track of what medications you are taking and when is it that you will need a refill. The day before your appointment: write a list of all prescriptions that need to be refilled. The day of the appointment: give the list to the admitting nurse. Prescriptions will be written only during appointments. No prescriptions will be written on procedure days. If you forget a medication: it will not be "Called in", "Faxed", or  "electronically sent". You will need to get another appointment to get these prescribed. No early refills. Do not call asking to have your prescription filled early. Partial  or short prescriptions: Occasionally your pharmacy may not have enough pills to fill your prescription.  NEVER ACCEPT a partial fill or a prescription that is short of the total amount of pills that you were prescribed.  With controlled substances the law allows 72 hours for the pharmacy to complete the prescription.  If the prescription is not completed within 72 hours, the pharmacist will require a new prescription to be written. This means that you will be short on your medicine and we WILL NOT send another prescription to complete your original prescription.  Instead, request the pharmacy to send a carrier to a nearby branch to get enough medication to provide you with your full prescription. Prescription Accuracy: You are responsible for carefully inspecting your prescriptions before leaving our office. Have the discharge nurse carefully go over each prescription with you, before taking them home. Make sure that your name is accurately spelled, that your address is correct. Check the name and dose of your medication to make sure it is accurate. Check the number of pills, and the written instructions to make sure they are clear and accurate. Make sure that you are given enough medication to last until your next medication refill appointment. Taking Medication: Take medication as prescribed. When it comes to controlled substances, taking less pills or less frequently than prescribed is permitted and encouraged. Never take more pills than instructed. Never take the medication more frequently than prescribed.  Inform other Doctors: Always inform, all of your healthcare providers, of all the medications you take. Pain Medication from other Providers: You are not allowed to accept any additional pain medication from any other Doctor or  Healthcare provider. There are   two exceptions to this rule. (see below) In the event that you require additional pain medication, you are responsible for notifying us, as stated below. Cough Medicine: Often these contain an opioid, such as codeine or hydrocodone. Never accept or take cough medicine containing these opioids if you are already taking an opioid* medication. The combination may cause respiratory failure and death. Medication Agreement: You are responsible for carefully reading and following our Medication Agreement. This must be signed before receiving any prescriptions from our practice. Safely store a copy of your signed Agreement. Violations to the Agreement will result in no further prescriptions. (Additional copies of our Medication Agreement are available upon request.) Laws, Rules, & Regulations: All patients are expected to follow all Federal and [employer], Rules, [employer]. Ignorance of the Laws does not constitute a valid excuse.  Illegal drugs and Controlled Substances: The use of illegal substances (including, but not limited to marijuana and its derivatives) and/or the illegal use of any controlled substances is strictly prohibited. Violation of this rule may result in the immediate and permanent discontinuation of any and all prescriptions being written by our practice. The use of any illegal substances is prohibited. Adopted CDC guidelines & recommendations: Target dosing levels will be at or below 60 MME/day. Use of benzodiazepines** is not recommended.  Exceptions: There are only two exceptions to the rule of not receiving pain medications from other Healthcare Providers. Exception #1 (Emergencies): In the event of an emergency (i.e.: accident requiring emergency care), you are allowed to receive additional pain medication. However, you are responsible for: As soon as you are able, call our office [phone], at any time of the day or night, and leave a  message stating your name, the date and nature of the emergency, and the name and dose of the medication prescribed. In the event that your call is answered by a member of our staff, make sure to document and save the date, time, and the name of the person that took your information.  Exception #2 (Planned Surgery): In the event that you are scheduled by another doctor or dentist to have any type of surgery or procedure, you are allowed (for a period no longer than 30 days), to receive additional pain medication, for the acute post-op pain. However, in this case, you are responsible for picking up a copy of our "Post-op Pain Management for Surgeons" handout, and giving it to your surgeon or dentist. This document is available at our office, and does not require an appointment to obtain it. Simply go to our office during business hours (Monday-Thursday from 8:00 AM to 4:00 PM) (Friday 8:00 AM to 12:00 Noon) or if you have a scheduled appointment with [place], prior to your surgery, and ask for it by name. In addition, you are responsible for: calling our office [phone], at any time of the day or night, and leaving a message stating your name, name of your surgeon, type of surgery, and date of procedure or surgery. Failure to comply with your responsibilities may result in termination of therapy involving the controlled substances. Medication Agreement Violation. Following the above rules, including your responsibilities will help you in avoiding a Medication Agreement Violation ("Breaking your Pain Medication Contract").  Consequences:  Not following the above rules may result in permanent discontinuation of medication prescription therapy.  *Opioid medications include: morphine, codeine, oxycodone, oxymorphone, hydrocodone, hydromorphone, meperidine, tramadol, tapentadol, buprenorphine, fentanyl, methadone. **Benzodiazepine medications include: diazepam (Valium), alprazolam (Xanax), clonazepam (Klonopine),  lorazepam (  Ativan), clorazepate (Tranxene), chlordiazepoxide (Librium), estazolam (Prosom), oxazepam (Serax), temazepam (Restoril), triazolam (Halcion) (Last updated: 11/13/2021) ______________________________________________________________________   ______________________________________________________________________  Medication Recommendations and Reminders  Applies to: All patients receiving prescriptions (written and/or electronic).  Medication Rules & Regulations: You are responsible for reading, knowing, and following our "Medication Rules" document. These exist for your safety and that of others. They are not flexible and neither are we. Dismissing or ignoring them is an act of "non-compliance" that may result in complete and irreversible termination of such medication therapy. For safety reasons, "non-compliance" will not be tolerated. As with the [place] fundamental legal principle of "ignorance of the law is no defense", we will accept no excuses for not having read and knowing the content of documents provided to you by our practice.  Pharmacy of record:  Definition: This is the pharmacy where your electronic prescriptions will be sent.  We do not endorse any particular pharmacy. It is up to you and your insurance to decide what pharmacy to use.  We do not restrict you in your choice of pharmacy. However, once we write for your prescriptions, we will NOT be re-sending more prescriptions to fix restricted supply problems created by your pharmacy, or your insurance.  The pharmacy listed in the electronic medical record should be the one where you want electronic prescriptions to be sent. If you choose to change pharmacy, simply notify our nursing staff. Changes will be made only during your regular appointments and not over the phone.  Recommendations: Keep all of your pain medications in a safe place, under lock and key, even if you live alone. We will NOT replace lost, stolen, or  damaged medication. We do not accept "Police Reports" as proof of medications having been stolen. After you fill your prescription, take 1 week's worth of pills and put them away in a safe place. You should keep a separate, properly labeled bottle for this purpose. The remainder should be kept in the original bottle. Use this as your primary supply, until it runs out. Once it's gone, then you know that you have 1 week's worth of medicine, and it is time to come in for a prescription refill. If you do this correctly, it is unlikely that you will ever run out of medicine. To make sure that the above recommendation works, it is very important that you make sure your medication refill appointments are scheduled at least 1 week before you run out of medicine. To do this in an effective manner, make sure that you do not leave the office without scheduling your next medication management appointment. Always ask the nursing staff to show you in your prescription , when your medication will be running out. Then arrange for the receptionist to get you a return appointment, at least 7 days before you run out of medicine. Do not wait until you have 1 or 2 pills left, to come in. This is very poor planning and does not take into consideration that we may need to cancel appointments due to bad weather, sickness, or emergencies affecting our staff. DO NOT ACCEPT A "Partial Fill": If for any reason your pharmacy does not have enough pills/tablets to completely fill or refill your prescription, do not allow for a "partial fill". The law allows the pharmacy to complete that prescription within 72 hours, without requiring a new prescription. If they do not fill the rest of your prescription within those 72 hours, you will need a separate prescription to fill the remaining amount,  which we will NOT provide. If the reason for the partial fill is your insurance, you will need to talk to the pharmacist about payment alternatives for  the remaining tablets, but again, DO NOT ACCEPT A PARTIAL FILL, unless you can trust your pharmacist to obtain the remainder of the pills within 72 hours.  Prescription refills and/or changes in medication(s):  Prescription refills, and/or changes in dose or medication, will be conducted only during scheduled medication management appointments. (Applies to both, written and electronic prescriptions.) No refills on procedure days. No medication will be changed or started on procedure days. No changes, adjustments, and/or refills will be conducted on a procedure day. Doing so will interfere with the diagnostic portion of the procedure. No phone refills. No medications will be "called into the pharmacy". No Fax refills. No weekend refills. No Holliday refills. No after hours refills.  Remember:  Business hours are:  Monday to Thursday 8:00 AM to 4:00 PM Provider's Schedule: Milinda Pointer, MD - Appointments are:  Medication management: Monday and Wednesday 8:00 AM to 4:00 PM Procedure day: Tuesday and Thursday 7:30 AM to 4:00 PM Gillis Santa, MD - Appointments are:  Medication management: Tuesday and Thursday 8:00 AM to 4:00 PM Procedure day: Monday and Wednesday 7:30 AM to 4:00 PM (Last update: 11/13/2021) ______________________________________________________________________

## 2022-02-21 NOTE — Progress Notes (Signed)
PROVIDER NOTE: Information contained herein reflects review and annotations entered in association with encounter. Interpretation of such information and data should be left to medically-trained personnel. Information provided to patient can be located elsewhere in the medical record under "Patient Instructions". Document created using STT-dictation technology, any transcriptional errors that may result from process are unintentional.    Patient: [name]  Service Category: E/M  Provider: Gillis Santa, MD  DOB: 03/13/1940  DOS: 02/21/2022  Referring Provider: Marinda Elk, MD  MRN: [id number]  Specialty: Interventional Pain Management  PCP: Marinda Elk, MD  Type: Established Patient  Setting: Ambulatory outpatient    Location: Office  Delivery: Face-to-face     HPI  Ms. FRANCI OSHANA, a 82 y.o. year old female, is here today because of her Lumbar radiculopathy [M54.16]. Ms. Bors primary complain today is Knee Pain (right) and Back Pain (lower) Last encounter: My last encounter with her was on 11/29/2021. Pertinent problems: Ms. Frese has History of left hip replacement; S/P revision of total knee, right; Chronic pain of right knee; Chronic pain syndrome; and History of lumbar laminectomy for spinal cord decompression (L3-L5) Dr Cari Caraway 2022 on their pertinent problem list. Pain Assessment: Severity of Chronic pain is reported as a 8 /10. Location: Back Lower/left groin. Onset: More than a month ago. Quality: Constant, Aching. Timing: Constant. Modifying factor(s): Tylenol Arthritis, Lyrica, Tramadol. Vitals:  height is '5\' 1"'$  (1.549 m) and weight is 158 lb (71.7 kg). Her temporal temperature is 98.2 F (36.8 C). Her blood pressure is 156/53 (abnormal) and her pulse is 67. Her respiration is 16 and oxygen saturation is 100%.   Reason for encounter: increased low back and left groin pain   History of L3-5 decompression with Dr Cari Caraway 05/10/20 Complaining of increased low  back pain and left buttock and left radiating groin pain Has not had an MRI since her surgery Requesting refill Hydrocodone for pain management given her pain flare at this time   11/29/21 Excellent analgesic benefit with Lyrica at 25 mg qhs, will titrate up to 25 mg BID next week.   HPI from initial clinic visit: 11/20/21 [place] is a very pleasant 82 year old female who presents with a chief complaint of right knee pain.  She is status post right knee replacement in 2011 followed by revision surgery in January 2021.  She continues to have persistent and severe right knee pain.  She states that her revision surgery was rough.  She states that she had a difficult postoperative course.  She has tried a right genicular nerve block as well as radiofrequency ablation without any benefit of her right knee.  She also has a history of L3-L5 lumbar decompression.  She does not endorse any low back or radiating leg pain.  Her knee pain is most pronounced on the right lateral aspect of her patella.  She has worsening pain when she is bearing weight.  She has tried a knee brace as well as heat to the region.  She has done physical therapy in the past with limited response.  She has tried gabapentin in the past 300 mg 3 times a day which was not effective.  She has not tried pregabalin.  She does find benefit with tramadol at night.  She states that tramadol works better than hydrocodone.  She also takes acetaminophen 650 mg 3 times a day.  She is unable to take NSAIDs given that she is on Eliquis for A-fib.   Monitoring: [place] PMP: PDMP  not reviewed this encounter.       Pharmacotherapy: No side-effects or adverse reactions reported. Compliance: No problems identified. Effectiveness: Clinically acceptable.  Landis Martins, RN  02/21/2022 11:19 AM  Sign when Signing Visit Safety precautions to be maintained throughout the outpatient stay will include: orient to surroundings, keep bed in low position, maintain call  bell within reach at all times, provide assistance with transfer out of bed and ambulation.     No results found for: "CBDTHCR" No results found for: "D8THCCBX" No results found for: "D9THCCBX"  UDS:  Summary  Date Value Ref Range Status  11/21/2021 Note  Final    Comment:    ==================================================================== Compliance Drug Analysis, Ur ==================================================================== Test                             Result       Flag       Units  Drug Present and Declared for Prescription Verification   Pregabalin                     PRESENT      EXPECTED   Citalopram                     PRESENT      EXPECTED   Desmethylcitalopram            PRESENT      EXPECTED    Desmethylcitalopram is an expected metabolite of citalopram or the    enantiomeric form, escitalopram.    Acetaminophen                  PRESENT      EXPECTED  Drug Present not Declared for Prescription Verification   Tramadol                       >7937        UNEXPECTED ng/mg creat   O-Desmethyltramadol            7573         UNEXPECTED ng/mg creat   N-Desmethyltramadol            4151         UNEXPECTED ng/mg creat    Source of tramadol is a prescription medication. O-desmethyltramadol    and N-desmethyltramadol are expected metabolites of tramadol.  Drug Absent but Declared for Prescription Verification   Diphenhydramine                Not Detected UNEXPECTED ==================================================================== Test                      Result    Flag   Units      Ref Range   Creatinine              63               mg/dL      >=20 ==================================================================== Declared Medications:  The flagging and interpretation on this report are based on the  following declared medications.  Unexpected results may arise from  inaccuracies in the declared medications.   **Note: The testing scope of this panel  includes these medications:   Diphenhydramine (Tylenol PM)  Escitalopram (Lexapro)  Pregabalin (Lyrica)   **Note: The testing scope of this panel does not include small to  moderate amounts of these reported medications:  Acetaminophen (Tylenol)  Acetaminophen (Tylenol PM)   **Note: The testing scope of this panel does not include the  following reported medications:   Amlodipine (Norvasc)  Apixaban (Eliquis)  Clindamycin (Cleocin)  Dronedarone (Multaq)  Estradiol (Estrace)  Eye Drops  Pravastatin (Pravachol)  Vitamin D ==================================================================== For clinical consultation, please call [phone]. ====================================================================       ROS  Constitutional: Denies any fever or chills Gastrointestinal: No reported hemesis, hematochezia, vomiting, or acute GI distress Musculoskeletal: Low back pain, left greater than right, left groin pain Neurological: No reported episodes of acute onset apraxia, aphasia, dysarthria, agnosia, amnesia, paralysis, loss of coordination, or loss of consciousness  Medication Review  HYDROcodone-acetaminophen, Vitamin D, acetaminophen, amLODipine, apixaban, carboxymethylcellulose, clindamycin, diphenhydramine-acetaminophen, dronedarone, escitalopram, estradiol, pravastatin, and pregabalin  History Review  Allergy: Ms. Kobus is allergic to aspirin, nsaids, methocarbamol, morphine and related, and oxycodone. Drug: Ms. Weissman  reports no history of drug use. Alcohol:  reports no history of alcohol use. Tobacco:  reports that she has never smoked. She has never used smokeless tobacco. Social: Ms. Kitchens  reports that she has never smoked. She has never used smokeless tobacco. She reports that she does not drink alcohol and does not use drugs. Medical:  has a past medical history of Anxiety, Aortic atherosclerosis ([place]), Arthritis, Current use of long term  anticoagulation, Dysrhythmia, GERD (gastroesophageal reflux disease), History of 2019 novel coronavirus disease (COVID-19) (07/2019), History of kidney stones, Hypercholesteremia, Hypertension, IDA (iron deficiency anemia), Paroxysmal atrial fibrillation ([place]), and Skin cancer. Surgical: Ms. Somes  has a past surgical history that includes Abdominal hysterectomy; Septoplasty; Cataract extraction w/PHACO (Right, 07/13/2014); Cataract extraction w/PHACO (Left, 10/05/2014); Breast excisional biopsy (Left, 1973); Lumbar laminectomy/decompression microdiscectomy (N/A, 05/10/2020); Breast cyst aspiration (Left, 11/21/2015); Joint replacement; Total hip arthroplasty (Left, 06/16/2020); Colonoscopy; and Total knee revision (Right, 02/15/2021). Family: family history includes CAD in her father; Dementia in her mother; Heart attack in her father.  Laboratory Chemistry Profile   Renal Lab Results  Component Value Date   BUN 19 02/20/2021   CREATININE 1.00 02/20/2021   GFRAA 38 (L) 06/19/2018   GFRNONAA 56 (L) 02/20/2021    Hepatic Lab Results  Component Value Date   AST 13 (L) 02/20/2021   ALT 11 02/20/2021   ALBUMIN 2.9 (L) 02/20/2021   ALKPHOS 58 02/20/2021   AMMONIA <10 02/20/2021    Electrolytes Lab Results  Component Value Date   NA 137 02/20/2021   K 3.4 (L) 02/20/2021   CL 104 02/20/2021   CALCIUM 9.0 02/20/2021    Bone No results found for: "VD25OH", "VD125OH2TOT", "PJ0932IZ1", "IW5809XI3", "25OHVITD1", "25OHVITD2", "25OHVITD3", "TESTOFREE", "TESTOSTERONE"  Inflammation (CRP: Acute Phase) (ESR: Chronic Phase) No results found for: "CRP", "ESRSEDRATE", "LATICACIDVEN"       Note: Above Lab results reviewed.  Recent Imaging Review  US BREAST LTD UNI RIGHT INC AXILLA CLINICAL DATA:  Callback for RIGHT breast mass. Patient is on hormone replacement therapy.  EXAM: DIGITAL DIAGNOSTIC UNILATERAL RIGHT MAMMOGRAM WITH TOMOSYNTHESIS; ULTRASOUND RIGHT BREAST LIMITED  TECHNIQUE: Right  digital diagnostic mammography and breast tomosynthesis was performed.; Targeted ultrasound examination of the right breast was performed  COMPARISON:  Previous exam(s).  ACR Breast Density Category c: The breast tissue is heterogeneously dense, which may obscure small masses.  FINDINGS: Spot compression tomosynthesis views confirm persistence of an oval circumscribed mass in the RIGHT lower breast at middle depth. No additional suspicious findings are noted.  On physical exam, no suspicious mass is appreciated.  Targeted ultrasound was performed of  the RIGHT lower breast. At 6 o'clock 3 cm from the nipple, there is an oval circumscribed anechoic mass with posterior acoustic enhancement. It measures 18 x 9 by 19 mm and is consistent with a benign simple cyst. This corresponds to the site of screening mammographic concern. An additional benign cyst is incidentally noted adjacent to this dominant cyst which measures 6 x 7 x 4 mm.  IMPRESSION: There is a benign cyst at the site of screening mammographic concern.  RECOMMENDATION: Screening mammogram in one year.(Code:SM-B-01Y)  I have discussed the findings and recommendations with the patient. If applicable, a reminder letter will be sent to the patient regarding the next appointment.  BI-RADS CATEGORY  2: Benign.  Electronically Signed   By: Valentino Saxon M.D.   On: 11/27/2021 11:48 MM DIAG BREAST TOMO UNI RIGHT CLINICAL DATA:  Callback for RIGHT breast mass. Patient is on hormone replacement therapy.  EXAM: DIGITAL DIAGNOSTIC UNILATERAL RIGHT MAMMOGRAM WITH TOMOSYNTHESIS; ULTRASOUND RIGHT BREAST LIMITED  TECHNIQUE: Right digital diagnostic mammography and breast tomosynthesis was performed.; Targeted ultrasound examination of the right breast was performed  COMPARISON:  Previous exam(s).  ACR Breast Density Category c: The breast tissue is heterogeneously dense, which may obscure small  masses.  FINDINGS: Spot compression tomosynthesis views confirm persistence of an oval circumscribed mass in the RIGHT lower breast at middle depth. No additional suspicious findings are noted.  On physical exam, no suspicious mass is appreciated.  Targeted ultrasound was performed of the RIGHT lower breast. At 6 o'clock 3 cm from the nipple, there is an oval circumscribed anechoic mass with posterior acoustic enhancement. It measures 18 x 9 by 19 mm and is consistent with a benign simple cyst. This corresponds to the site of screening mammographic concern. An additional benign cyst is incidentally noted adjacent to this dominant cyst which measures 6 x 7 x 4 mm.  IMPRESSION: There is a benign cyst at the site of screening mammographic concern.  RECOMMENDATION: Screening mammogram in one year.(Code:SM-B-01Y)  I have discussed the findings and recommendations with the patient. If applicable, a reminder letter will be sent to the patient regarding the next appointment.  BI-RADS CATEGORY  2: Benign.  Electronically Signed   By: Valentino Saxon M.D.   On: 11/27/2021 11:48 Note: Reviewed        Physical Exam  General appearance: Well nourished, well developed, and well hydrated. In no apparent acute distress Mental status: Alert, oriented x 3 (person, place, & time)       Respiratory: No evidence of acute respiratory distress Eyes: PERLA Vitals: BP (!) 156/53   Pulse 67   Temp 98.2 F (36.8 C) (Temporal)   Resp 16   Ht '5\' 1"'$  (1.549 m)   Wt 158 lb (71.7 kg)   SpO2 100%   BMI 29.85 kg/m  BMI: Estimated body mass index is 29.85 kg/m as calculated from the following:   Height as of this encounter: '5\' 1"'$  (1.549 m).   Weight as of this encounter: 158 lb (71.7 kg). Ideal: Ideal body weight: 47.8 kg (105 lb 6.1 oz) Adjusted ideal body weight: 57.3 kg (126 lb 6.8 oz)   Lumbar Spine Area Exam  Skin & Axial Inspection: Well healed scar from previous spine surgery  detected Alignment: Symmetrical Functional ROM: Pain restricted ROM       Stability: No instability detected Muscle Tone/Strength: Functionally intact. No obvious neuro-muscular anomalies detected. Sensory (Neurological): Musculoskeletal pain pattern, possibly dermatomal possibly referred from left SI joint  Gait & Posture Assessment  Ambulation: Unassisted Gait: Antalgic gait (limping) Posture: WNL  Lower Extremity Exam      Side: Right lower extremity   Side: Left lower extremity  Stability: No instability observed           Stability: No instability observed          Skin & Extremity Inspection: Skin color, temperature, and hair growth are WNL. No peripheral edema or cyanosis. No masses, redness, swelling, asymmetry, or associated skin lesions. No contractures.   Skin & Extremity Inspection: Skin color, temperature, and hair growth are WNL. No peripheral edema or cyanosis. No masses, redness, swelling, asymmetry, or associated skin lesions. No contractures.  Functional ROM: Unremarkable           Functional ROM: Unrestricted ROM                  Muscle Tone/Strength: Functionally intact. No obvious neuro-muscular anomalies detected.   Muscle Tone/Strength: Functionally intact. No obvious neuro-muscular anomalies detected.  Sensory (Neurological): Arthropathic arthralgia         Sensory (Neurological): Unimpaired        DTR: Patellar: deferred today Achilles: deferred today Plantar: deferred today   DTR: Patellar: deferred today Achilles: deferred today Plantar: deferred today  Palpation: No palpable anomalies   Palpation: No palpable anomalies       Assessment   Diagnosis Status  1. Lumbar radiculopathy   2. History of lumbar laminectomy for spinal cord decompression   3. Lumbar post-laminectomy syndrome   4. History of left hip replacement   5. Chronic left SI joint pain   6. Chronic pain syndrome     Increased pain Persistent Increased pain   Updated  Problems: Problem  Lumbar Radiculopathy  Lumbar Post-Laminectomy Syndrome  Chronic Left Si Joint Pain      Plan of Care  1. Lumbar radiculopathy - DG Lumbar Spine Complete W/Bend; Future - MR LUMBAR SPINE WO CONTRAST; Future - HYDROcodone-acetaminophen (NORCO/VICODIN) 5-325 MG tablet; Take 1 tablet by mouth every 12 (twelve) hours as needed for severe pain. Must last 30 days.  Dispense: 60 tablet; Refill: 0  2. History of lumbar laminectomy for spinal cord decompression - DG Lumbar Spine Complete W/Bend; Future - MR LUMBAR SPINE WO CONTRAST; Future - HYDROcodone-acetaminophen (NORCO/VICODIN) 5-325 MG tablet; Take 1 tablet by mouth every 12 (twelve) hours as needed for severe pain. Must last 30 days.  Dispense: 60 tablet; Refill: 0  3. Lumbar post-laminectomy syndrome - DG Lumbar Spine Complete W/Bend; Future - MR LUMBAR SPINE WO CONTRAST; Future - HYDROcodone-acetaminophen (NORCO/VICODIN) 5-325 MG tablet; Take 1 tablet by mouth every 12 (twelve) hours as needed for severe pain. Must last 30 days.  Dispense: 60 tablet; Refill: 0  4. History of left hip replacement - DG Si Joints; Future - HYDROcodone-acetaminophen (NORCO/VICODIN) 5-325 MG tablet; Take 1 tablet by mouth every 12 (twelve) hours as needed for severe pain. Must last 30 days.  Dispense: 60 tablet; Refill: 0  5. Chronic left SI joint pain - DG Si Joints; Future - HYDROcodone-acetaminophen (NORCO/VICODIN) 5-325 MG tablet; Take 1 tablet by mouth every 12 (twelve) hours as needed for severe pain. Must last 30 days.  Dispense: 60 tablet; Refill: 0  6. Chronic pain syndrome - DG Lumbar Spine Complete W/Bend; Future - MR LUMBAR SPINE WO CONTRAST; Future - DG Si Joints; Future - HYDROcodone-acetaminophen (NORCO/VICODIN) 5-325 MG tablet; Take 1 tablet by mouth every 12 (twelve) hours as needed for severe pain. Must last  30 days.  Dispense: 60 tablet; Refill: 0    Pharmacotherapy (Medications Ordered): Meds ordered this  encounter  Medications   HYDROcodone-acetaminophen (NORCO/VICODIN) 5-325 MG tablet    Sig: Take 1 tablet by mouth every 12 (twelve) hours as needed for severe pain. Must last 30 days.    Dispense:  60 tablet    Refill:  0    Chronic Pain: STOP Act (Not applicable) Fill 1 day early if closed on refill date. Avoid benzodiazepines within 8 hours of opioids   Orders:  Orders Placed This Encounter  Procedures   DG Lumbar Spine Complete W/Bend    Patient presents with axial pain with possible radicular component. Please assist [place] in identifying specific level(s) and laterality of any additional findings such as: 1. Facet (Zygapophyseal) joint DJD (Hypertrophy, space narrowing, subchondral sclerosis, and/or osteophyte formation) 2. DDD and/or IVDD (Loss of disc height, desiccation, gas patterns, osteophytes, endplate sclerosis, or "Black disc disease") 3. Pars defects 4. Spondylolisthesis, spondylosis, and/or spondyloarthropathies (include Degree/Grade of displacement in mm) (stability) 5. Vertebral body Fractures (acute/chronic) (state percentage of collapse) 6. Demineralization (osteopenia/osteoporotic) 7. Bone pathology 8. Foraminal narrowing  9. Surgical changes    Standing Status:   Future    Standing Expiration Date:   03/22/2022    Scheduling Instructions:     Please make sure that the patient understands that this needs to be done as soon as possible. Never have the patient do the imaging "just before the next appointment". Inform patient that having the imaging done within the [facility] will expedite the availability of the results and will provide      imaging availability to the requesting physician. In addition inform the patient that the imaging order has an expiration date and will not be renewed if not done within the active period.    Order Specific Question:   Reason for Exam (SYMPTOM  OR DIAGNOSIS REQUIRED)    Answer:   Low back pain    Order Specific Question:   Preferred  imaging location?    Answer:   [facility]    Order Specific Question:   Call Results- Best Contact Number?    Answer:   [phone] ([facility])    Order Specific Question:   Radiology Contrast Protocol - do NOT remove file path    Answer:   \\[email or web address]    Order Specific Question:   Release to patient    Answer:   Immediate   MR LUMBAR SPINE WO CONTRAST    Patient presents with axial pain with possible radicular component. Please assist [place] in identifying specific level(s) and laterality of any additional findings such as: 1. Facet (Zygapophyseal) joint DJD (Hypertrophy, space narrowing, subchondral sclerosis, and/or osteophyte formation) 2. DDD and/or IVDD (Loss of disc height, desiccation, gas patterns, osteophytes, endplate sclerosis, or "Black disc disease") 3. Pars defects 4. Spondylolisthesis, spondylosis, and/or spondyloarthropathies (include Degree/Grade of displacement in mm) (stability) 5. Vertebral body Fractures (acute/chronic) (state percentage of collapse) 6. Demineralization (osteopenia/osteoporotic) 7. Bone pathology 8. Foraminal narrowing  9. Surgical changes 10. Central, Lateral Recess, and/or Foraminal Stenosis (include AP diameter of stenosis in mm) 11. Surgical changes (hardware type, status, and presence of fibrosis) 12. Modic Type Changes (MRI only) 13. IVDD (Disc bulge, protrusion, herniation, extrusion) (Level, laterality, extent)    Standing Status:   Future    Standing Expiration Date:   03/22/2022    Scheduling Instructions:     Please make sure that the patient understands that this needs  to be done as soon as possible. Never have the patient do the imaging "just before the next appointment". Inform patient that having the imaging done within the [facility] will expedite the availability of the results and will provide      imaging availability to the requesting physician. In addition inform the  patient that the imaging order has an expiration date and will not be renewed if not done within the active period.    Order Specific Question:   What is the patient's sedation requirement?    Answer:   No Sedation    Order Specific Question:   Does the patient have a pacemaker or implanted devices?    Answer:   No    Order Specific Question:   Preferred imaging location?    Answer:   [facility] (table limit-350lbs)    Order Specific Question:   Call Results- Best Contact Number?    Answer:   [phone] ([facility])    Order Specific Question:   Radiology Contrast Protocol - do NOT remove file path    Answer:   \\[email or web address]   DG Si Joints    Standing Status:   Future    Standing Expiration Date:   02/22/2023    Order Specific Question:   Reason for Exam (SYMPTOM  OR DIAGNOSIS REQUIRED)    Answer:   left groin pain, left low back pain, history of lumbar spine surgery 2 years ago    Order Specific Question:   Preferred imaging location?    Answer:   [place] as prescribed.  No refills needed.  Follow-up plan:   Return for patient will call to schedule F2F appt after xrays and MRI.    Recent Visits Date Type Provider Dept  11/29/21 Office Visit Gillis Santa, MD [facility]  Showing recent visits within past 90 days and meeting all other requirements Today's Visits Date Type Provider Dept  02/21/22 Office Visit Gillis Santa, MD [facility]  Showing today's visits and meeting all other requirements Future Appointments No visits were found meeting these conditions. Showing future appointments within next 90 days and meeting all other requirements  I discussed the assessment and treatment plan with the patient. The patient was provided an opportunity to ask questions and all were answered. The patient agreed with the plan and demonstrated an understanding of the instructions.  Patient  advised to call back or seek an in-person evaluation if the symptoms or condition worsens.  Duration of encounter: 4mnutes.  Total time on encounter, as per AMA guidelines included both the face-to-face and non-face-to-face time personally spent by the physician and/or other qualified health care professional(s) on the day of the encounter (includes time in activities that require the physician or other qualified health care professional and does not include time in activities normally performed by clinical staff). Physician's time may include the following activities when performed: preparing to see the patient (eg, review of tests, pre-charting review of records) obtaining and/or reviewing separately obtained history performing a medically appropriate examination and/or evaluation counseling and educating the patient/family/caregiver ordering medications, tests, or procedures referring and communicating with other health care professionals (when not separately reported) documenting clinical information in the electronic or other health record independently interpreting results (not separately reported) and communicating results to the patient/ family/caregiver care coordination (not separately reported)  Note by: BGillis Santa MD Date: 02/21/2022; Time: 11:46 AM

## 2022-02-21 NOTE — Progress Notes (Signed)
Safety precautions to be maintained throughout the outpatient stay will include: orient to surroundings, keep bed in low position, maintain call bell within reach at all times, provide assistance with transfer out of bed and ambulation.  

## 2022-03-12 NOTE — Telephone Encounter (Signed)
Patient needs Pregabalin refilled. She has appt on March 6 but she will be out before that. Please advise patient

## 2022-03-20 ENCOUNTER — Ambulatory Visit
Admission: RE | Admit: 2022-03-20 | Discharge: 2022-03-20 | Disposition: A | Discharge status: Home / Self Care | Payer: Medicare Other | Source: Ambulatory Visit | Attending: Student in an Organized Health Care Education/Training Program | Admitting: Student in an Organized Health Care Education/Training Program

## 2022-03-20 DIAGNOSIS — M5416 Radiculopathy, lumbar region: Secondary | ICD-10-CM

## 2022-03-20 DIAGNOSIS — G894 Chronic pain syndrome: Secondary | ICD-10-CM

## 2022-03-20 DIAGNOSIS — Z9889 Other specified postprocedural states: Secondary | ICD-10-CM

## 2022-03-20 DIAGNOSIS — M961 Postlaminectomy syndrome, not elsewhere classified: Secondary | ICD-10-CM

## 2022-03-27 ENCOUNTER — Encounter: Payer: Self-pay | Admitting: Student in an Organized Health Care Education/Training Program

## 2022-03-27 ENCOUNTER — Ambulatory Visit
Payer: Medicare Other | Attending: Student in an Organized Health Care Education/Training Program | Admitting: Student in an Organized Health Care Education/Training Program

## 2022-03-27 VITALS — BP 125/51 | HR 53 | Temp 97.2°F | Resp 17 | Ht 62.0 in | Wt 155.0 lb

## 2022-03-27 DIAGNOSIS — M533 Sacrococcygeal disorders, not elsewhere classified: Secondary | ICD-10-CM | POA: Insufficient documentation

## 2022-03-27 DIAGNOSIS — Z96642 Presence of left artificial hip joint: Secondary | ICD-10-CM | POA: Insufficient documentation

## 2022-03-27 DIAGNOSIS — G894 Chronic pain syndrome: Secondary | ICD-10-CM | POA: Insufficient documentation

## 2022-03-27 DIAGNOSIS — Z9889 Other specified postprocedural states: Secondary | ICD-10-CM | POA: Insufficient documentation

## 2022-03-27 DIAGNOSIS — M5416 Radiculopathy, lumbar region: Secondary | ICD-10-CM | POA: Insufficient documentation

## 2022-03-27 DIAGNOSIS — G8929 Other chronic pain: Secondary | ICD-10-CM | POA: Diagnosis present

## 2022-03-27 DIAGNOSIS — M961 Postlaminectomy syndrome, not elsewhere classified: Secondary | ICD-10-CM | POA: Diagnosis not present

## 2022-03-27 MED ORDER — TRAMADOL HCL ER 100 MG PO TB24: 100.0000 mg | ORAL_TABLET | Freq: Every day | ORAL | 1 refills | Status: DC

## 2022-03-27 MED ORDER — PREGABALIN 50 MG PO CAPS: 50.0000 mg | ORAL_CAPSULE | Freq: Two times a day (BID) | ORAL | 5 refills | Status: DC

## 2022-03-27 NOTE — Assessment & Plan Note (Signed)
Patient finds better pain relief for tramadol than she does with hydrocodone.  States that tramadol does make her slightly sedated so she has to take it at night.  As a result she has increased pain during the day.  We discussed transitioning to ER tramadol 100 mg nightly.  She is also finding benefit with Lyrica.  No side effects noted.  Recommend dose escalation from 25 mg twice daily to 50 mg twice daily.  Continue Tylenol as prescribed.

## 2022-03-27 NOTE — Assessment & Plan Note (Signed)
Reviewed lumbar MRI with patient which shows new left subarticular migrating disc extrusion resulting in effacement of the left subarticular zone likely impinging the left L1 and L2 nerve roots which is likely contributing to her radiating hip and left groin pain.  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  She also has lumbar facet arthropathy in these areas.  She has tried lumbar epidural steroid injections in the past with Dr. Sharlet Salina with limited response

## 2022-03-27 NOTE — Progress Notes (Signed)
PROVIDER NOTE: Information contained herein reflects review and annotations entered in association with encounter. Interpretation of such information and data should be left to medically-trained personnel. Information provided to patient can be located elsewhere in the medical record under "Patient Instructions". Document created using STT-dictation technology, any transcriptional errors that may result from process are unintentional.    Patient: [name]  Service Category: E/M  Provider: Gillis Santa, MD  DOB: Oct 08, 1940  DOS: 03/27/2022  Referring Provider: Marinda Elk, MD  MRN: [id number]  Specialty: Interventional Pain Management  PCP: Marinda Elk, MD  Type: Established Patient  Setting: Ambulatory outpatient    Location: Office  Delivery: Face-to-face     HPI  [name], a 82 y.o. year old female, is here today because of her Lumbar radiculopathy [M54.16]. [name] primary complain today is Back Pain and Knee Pain (Right)  Pertinent problems: [name] has History of left hip replacement; S/P revision of total knee, right; Chronic pain of right knee; Chronic pain syndrome; and History of lumbar laminectomy for spinal cord decompression (L3-L5) Dr Cari Caraway 2022 on their pertinent problem list. Pain Assessment: Severity of Chronic pain is reported as a 6 /10. Location: Back Lower/sometimes to left groin. Onset: More than a month ago. Quality: Constant. Timing: Constant. Modifying factor(s): meds, sitting, reclining. Vitals:  height is '5\' 2"'$  (1.575 m) and weight is 155 lb (70.3 kg). Her temporal temperature is 97.2 F (36.2 C) (abnormal). Her blood pressure is 125/51 (abnormal) and her pulse is 53 (abnormal). Her respiration is 17 and oxygen saturation is 100%.  BMI: Estimated body mass index is 28.35 kg/m as calculated from the following:   Height as of this encounter: '5\' 2"'$  (1.575 m).   Weight as of this encounter: 155 lb (70.3 kg). Last encounter:  02/21/2022. Last procedure: Visit date not found.  Reason for encounter: follow-up evaluation  Patient presents today to review her lumbar x-ray and lumbar MRI results.  She continues to endorse persistent and intermittently severe low back pain with radiation into her left groin and left leg at times.  She is also endorsing right knee pain status post right knee replacement in 2011 status post revision of January 2021.  She is accompanied today by her son.  She would also like to discuss medication management options for chronic pain management.   Pharmacotherapy Assessment  Analgesic: Start tramadol 100 mg ER  Monitoring: [place] PMP: PDMP reviewed during this encounter.       Pharmacotherapy: No side-effects or adverse reactions reported. Compliance: No problems identified. Effectiveness: Clinically acceptable.  Rise Patience, RN  03/27/2022  1:31 PM  Sign when Signing Visit Safety precautions to be maintained throughout the outpatient stay will include: orient to surroundings, keep bed in low position, maintain call bell within reach at all times, provide assistance with transfer out of bed and ambulation.     No results found for: "CBDTHCR" No results found for: "D8THCCBX" No results found for: "D9THCCBX"  UDS:  Summary  Date Value Ref Range Status  11/21/2021 Note  Final    Comment:    ==================================================================== Compliance Drug Analysis, Ur ==================================================================== Test                             Result       Flag       Units  Drug Present and Declared for Prescription Verification   Pregabalin  PRESENT      EXPECTED   Citalopram                     PRESENT      EXPECTED   Desmethylcitalopram            PRESENT      EXPECTED    Desmethylcitalopram is an expected metabolite of citalopram or the    enantiomeric form, escitalopram.    Acetaminophen                  PRESENT       EXPECTED  Drug Present not Declared for Prescription Verification   Tramadol                       >7937        UNEXPECTED ng/mg creat   O-Desmethyltramadol            7573         UNEXPECTED ng/mg creat   N-Desmethyltramadol            4151         UNEXPECTED ng/mg creat    Source of tramadol is a prescription medication. O-desmethyltramadol    and N-desmethyltramadol are expected metabolites of tramadol.  Drug Absent but Declared for Prescription Verification   Diphenhydramine                Not Detected UNEXPECTED ==================================================================== Test                      Result    Flag   Units      Ref Range   Creatinine              63               mg/dL      >=20 ==================================================================== Declared Medications:  The flagging and interpretation on this report are based on the  following declared medications.  Unexpected results may arise from  inaccuracies in the declared medications.   **Note: The testing scope of this panel includes these medications:   Diphenhydramine (Tylenol PM)  Escitalopram (Lexapro)  Pregabalin (Lyrica)   **Note: The testing scope of this panel does not include small to  moderate amounts of these reported medications:   Acetaminophen (Tylenol)  Acetaminophen (Tylenol PM)   **Note: The testing scope of this panel does not include the  following reported medications:   Amlodipine (Norvasc)  Apixaban (Eliquis)  Clindamycin (Cleocin)  Dronedarone (Multaq)  Estradiol (Estrace)  Eye Drops  Pravastatin (Pravachol)  Vitamin D ==================================================================== For clinical consultation, please call [phone]. ====================================================================       ROS  Constitutional: Denies any fever or chills Gastrointestinal: No reported hemesis, hematochezia, vomiting, or acute GI  distress Musculoskeletal:  Low back pain with radiation into left groin and left leg, right knee pain Neurological: No reported episodes of acute onset apraxia, aphasia, dysarthria, agnosia, amnesia, paralysis, loss of coordination, or loss of consciousness  Medication Review  Vitamin D, acetaminophen, amLODipine, apixaban, carboxymethylcellulose, clindamycin, dronedarone, escitalopram, estradiol, pravastatin, pregabalin, and traMADol  History Review  Allergy: [name] is allergic to aspirin, nsaids, methocarbamol, morphine and related, and oxycodone. Drug: [name]  reports no history of drug use. Alcohol:  reports no history of alcohol use. Tobacco:  reports that she has never smoked. She has never used smokeless tobacco. Social: [name]  reports that she has never smoked.  She has never used smokeless tobacco. She reports that she does not drink alcohol and does not use drugs. Medical:  has a past medical history of Anxiety, Aortic atherosclerosis ([place]), Arthritis, Current use of long term anticoagulation, Dysrhythmia, GERD (gastroesophageal reflux disease), History of 2019 novel coronavirus disease (COVID-19) (07/2019), History of kidney stones, Hypercholesteremia, Hypertension, IDA (iron deficiency anemia), Paroxysmal atrial fibrillation ([place]), and Skin cancer. Surgical: [name]  has a past surgical history that includes Abdominal hysterectomy; Septoplasty; Cataract extraction w/PHACO (Right, 07/13/2014); Cataract extraction w/PHACO (Left, 10/05/2014); Breast excisional biopsy (Left, 1973); Lumbar laminectomy/decompression microdiscectomy (N/A, 05/10/2020); Breast cyst aspiration (Left, 11/21/2015); Joint replacement; Total hip arthroplasty (Left, 06/16/2020); Colonoscopy; and Total knee revision (Right, 02/15/2021). Family: family history includes CAD in her father; Dementia in her mother; Heart attack in her father.  Laboratory Chemistry Profile   Renal Lab Results  Component  Value Date   BUN 19 02/20/2021   CREATININE 1.00 02/20/2021   GFRAA 38 (L) 06/19/2018   GFRNONAA 56 (L) 02/20/2021    Hepatic Lab Results  Component Value Date   AST 13 (L) 02/20/2021   ALT 11 02/20/2021   ALBUMIN 2.9 (L) 02/20/2021   ALKPHOS 58 02/20/2021   AMMONIA <10 02/20/2021    Electrolytes Lab Results  Component Value Date   NA 137 02/20/2021   K 3.4 (L) 02/20/2021   CL 104 02/20/2021   CALCIUM 9.0 02/20/2021    Bone No results found for: "VD25OH", "VD125OH2TOT", "[id number]", "UK:[id number]", "25OHVITD1", "25OHVITD2", "25OHVITD3", "TESTOFREE", "TESTOSTERONE"  Inflammation (CRP: Acute Phase) (ESR: Chronic Phase) No results found for: "CRP", "ESRSEDRATE", "LATICACIDVEN"       Note: Above Lab results reviewed.  Recent Imaging Review  MR LUMBAR SPINE WO CONTRAST CLINICAL DATA:  Lumbar radiculopathy M54.16 (ICD-10-CM). History of lumbar laminectomy for spinal cord decompression Z98.890 (ICD-10-CM). Lumbar post-laminectomy syndrome M96.1 (ICD-10-CM). Chronic pain syndrome G89.4 (ICD-10-CM).  EXAM: MRI LUMBAR SPINE WITHOUT CONTRAST  TECHNIQUE: Multiplanar, multisequence MR imaging of the lumbar spine was performed. No intravenous contrast was administered.  COMPARISON:  MRI of the lumbar spine January 09, 2019.  FINDINGS: Segmentation:  Standard.  Alignment: A 3-4 mm anterolisthesis of L4 and trace anterolisthesis of T12 over L1. Small retrolisthesis at L1-2, L2-3 and L3-4. Levoconvex curvature.  Vertebrae: No fracture, evidence of discitis, or bone lesion. Postsurgical changes from left laminotomy at L3-4 and L4-5. Degenerative endplate changes at [id number] and L3-4 with reactive marrow edema at L3-4. L1 inferior endplate degenerative changes with associated Schmorl node and reactive marrow edema.  Conus medullaris and cauda equina: Conus extends to the L2 level. Conus and cauda equina appear normal.  Paraspinal and other soft tissues: Negative.  Disc  levels:  T12-L1: Mild anterolisthesis, shallow disc bulge, moderate right and mild left facet degenerative changes without significant spinal canal or neural foraminal stenosis.  L1-2: Loss of disc height, disc bulge with superimposed left subarticular, superiorly migrating disc extrusion and mild facet degenerative changes. Findings result in effacement of the left subarticular zone, likely impinging on the left L1 and L2 nerve roots and mild left neural foraminal narrowing. Findings have developed since prior MRI.  L2-3: Loss of disc height, disc bulge with associated osteophytic component and moderate facet degenerative changes resulting in narrowing of the bilateral subarticular zones, right greater than left, and moderate bilateral neural foraminal narrowing, unchanged.  L3-4: Retrolisthesis, loss of disc height, disc bulge, moderate facet degenerative changes and ligamentum flavum redundancy. Posttreatment changes from left laminotomy with improved spinal canal patency. Persistent prominent  subarticular zone stenosis and severe bilateral neural foraminal narrowing.  L4-5: Anterolisthesis, disc bulge left disc uncovering and advanced hypertrophic facet degenerative changes with ligamentum flavum redundancy on the right side. Postsurgical changes from left laminotomy with improved canal patency. There is persistent prominent narrowing of the bilateral subarticular zones and severe bilateral neural foraminal narrowing.  L5-S1: Shallow disc bulge, advanced facet degenerative changes and ligamentum flavum redundancy. There also 2 synovial cy cyst in the right fossa lateral aspect of the spinal canal causing indentation on the thecal sac and contributing for mild spinal canal stenosis. There is moderate narrowing of the bilateral subarticular zones and moderate bilateral neural foraminal narrowing.  IMPRESSION: 1. At L1-2, new left subarticular, superiorly migrating  disc extrusion resulting in effacement of the left subarticular zone, likely impinging on the left L1 and L2 nerve roots. 2. At L2-3, narrowing of the bilateral subarticular zones, right greater than left, and moderate bilateral neural foraminal narrowing, unchanged. 3. At L3-4, prominent narrowing of the bilateral subarticular zones and severe bilateral neural foraminal narrowing, unchanged. 4. At L4-5, prominent narrowing of the bilateral subarticular zones and severe bilateral neural foraminal narrowing, unchanged. 5. At L5-S1, moderate narrowing of the bilateral subarticular zones and moderate bilateral neural foraminal narrowing, unchanged.  Electronically Signed   By: Pedro Earls M.D.   On: 03/21/2022 09:51 Note: Reviewed        Physical Exam  General appearance: Well nourished, well developed, and well hydrated. In no apparent acute distress Mental status: Alert, oriented x 3 (person, place, & time)       Respiratory: No evidence of acute respiratory distress Eyes: PERLA Vitals: BP (!) 125/51   Pulse (!) 53   Temp (!) 97.2 F (36.2 C) (Temporal)   Resp 17   Ht '5\' 2"'$  (1.575 m)   Wt 155 lb (70.3 kg)   SpO2 100%   BMI 28.35 kg/m  BMI: Estimated body mass index is 28.35 kg/m as calculated from the following:   Height as of this encounter: '5\' 2"'$  (1.575 m).   Weight as of this encounter: 155 lb (70.3 kg). Ideal: Ideal body weight: 50.1 kg (110 lb 7.2 oz) Adjusted ideal body weight: 58.2 kg (128 lb 4.3 oz)  Lumbar Spine Area Exam  Skin & Axial Inspection: Well healed scar from previous spine surgery detected Alignment: Symmetrical Functional ROM: Pain restricted ROM       Stability: No instability detected Muscle Tone/Strength: Functionally intact. No obvious neuro-muscular anomalies detected. Sensory (Neurological): Musculoskeletal pain pattern   Gait & Posture Assessment  Ambulation: Unassisted Gait: Antalgic gait (limping) Posture: WNL   Lower  Extremity Exam      Side: Right lower extremity   Side: Left lower extremity  Stability: No instability observed           Stability: No instability observed          Skin & Extremity Inspection: Skin color, temperature, and hair growth are WNL. No peripheral edema or cyanosis. No masses, redness, swelling, asymmetry, or associated skin lesions. No contractures.   Skin & Extremity Inspection: Skin color, temperature, and hair growth are WNL. No peripheral edema or cyanosis. No masses, redness, swelling, asymmetry, or associated skin lesions. No contractures.  Functional ROM: Pain restricted ROM for hip and knee joints           Functional ROM: Unrestricted ROM                  Muscle Tone/Strength: Functionally  intact. No obvious neuro-muscular anomalies detected.   Muscle Tone/Strength: Functionally intact. No obvious neuro-muscular anomalies detected.  Sensory (Neurological): Arthropathic arthralgia         Sensory (Neurological): Unimpaired        DTR: Patellar: deferred today Achilles: deferred today Plantar: deferred today   DTR: Patellar: deferred today Achilles: deferred today Plantar: deferred today  Palpation: No palpable anomalies   Palpation: No palpable anomalies    Assessment   Diagnosis Status  1. Lumbar radiculopathy   2. Lumbar post-laminectomy syndrome   3. History of lumbar laminectomy for spinal cord decompression   4. History of left hip replacement   5. Chronic left SI joint pain   6. Chronic pain syndrome    Controlled Controlled Controlled    Plan of Care  Problem-specific:  Lumbar radiculopathy Reviewed lumbar MRI with patient which shows new left subarticular migrating disc extrusion resulting in effacement of the left subarticular zone likely impinging the left L1 and L2 nerve roots which is likely contributing to her radiating hip and left groin pain.  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  She also has lumbar facet arthropathy in these areas.  She has tried lumbar epidural steroid injections in the past with Dr. Sharlet Salina with limited response  History of lumbar laminectomy for spinal cord decompression (L3-L5) Dr Cari Caraway 2022 Discussed spinal cord stimulation, risks and benefits reviewed and patient would like to hold off on that  Lumbar post-laminectomy syndrome Discussed spinal cord stimulation and patient would like to hold off on that  Chronic pain syndrome Patient finds better pain relief for tramadol than she does with hydrocodone.  States that tramadol does make her slightly sedated so she has to take it at night.  As a result she has increased pain during the day.  We discussed transitioning to ER tramadol 100 mg nightly.  She is also finding benefit with Lyrica.  No side effects noted.  Recommend dose escalation from 25 mg twice daily to 50 mg twice daily.  Continue Tylenol as prescribed.  [name] has a current medication list which includes the following long-term medication(s): amlodipine, dronedarone, eliquis, escitalopram, estradiol, pravastatin, and pregabalin.  Pharmacotherapy (Medications Ordered): Meds ordered this encounter  Medications   pregabalin (LYRICA) 50 MG capsule    Sig: Take 1 capsule (50 mg total) by mouth 2 (two) times daily.    Dispense:  60 capsule    Refill:  5   traMADol (ULTRAM-ER) 100 MG 24 hr tablet    Sig: Take 1 tablet (100 mg total) by mouth daily.    Dispense:  30 tablet    Refill:  1   Orders:  No orders of the defined types were placed in this encounter.  Follow-up plan:   Return in about 6 weeks (around 05/08/2022) for Medication Management, in person.      Analgesic Pharmacotherapy:  Opioid Analgesics: trial of tramadol ER, consider buprenorphine  Membrane stabilizer:  Failed gabapentin.  Trial of Lyrica as above, excellent response thus far, increase to 50 mg twice daily  Muscle relaxant: To be  determined at a later time  NSAID: Medically contraindicated patient on Eliquis for A-fib  Other analgesic(s): To be determined at a later time       Recent Visits Date Type Provider Dept  02/21/22 Office Visit Gillis Santa, MD [facility]  Showing recent visits within past 90 days and meeting all other requirements  Today's Visits Date Type Provider Dept  03/27/22 Office Visit Gillis Santa, MD [facility]  Showing today's visits and meeting all other requirements Future Appointments Date Type Provider Dept  04/30/22 Appointment Gillis Santa, MD [facility]  Showing future appointments within next 90 days and meeting all other requirements  I discussed the assessment and treatment plan with the patient. The patient was provided an opportunity to ask questions and all were answered. The patient agreed with the plan and demonstrated an understanding of the instructions.  Patient advised to call back or seek an in-person evaluation if the symptoms or condition worsens.  Duration of encounter: 25mnutes.  Total time on encounter, as per AMA guidelines included both the face-to-face and non-face-to-face time personally spent by the physician and/or other qualified health care professional(s) on the day of the encounter (includes time in activities that require the physician or other qualified health care professional and does not include time in activities normally performed by clinical staff). Physician's time may include the following activities when performed: Preparing to see the patient (e.g., pre-charting review of records, searching for previously ordered imaging, lab work, and nerve conduction tests) Review of prior analgesic pharmacotherapies. Reviewing PMP Interpreting ordered tests (e.g., lab work, imaging, nerve conduction tests) Performing post-procedure evaluations, including interpretation of diagnostic procedures Obtaining and/or reviewing  separately obtained history Performing a medically appropriate examination and/or evaluation Counseling and educating the patient/family/caregiver Ordering medications, tests, or procedures Referring and communicating with other health care professionals (when not separately reported) Documenting clinical information in the electronic or other health record Independently interpreting results (not separately reported) and communicating results to the patient/ family/caregiver Care coordination (not separately reported)  Note by: BGillis Santa MD Date: 03/27/2022; Time: 2:35 PM

## 2022-03-27 NOTE — Progress Notes (Signed)
Safety precautions to be maintained throughout the outpatient stay will include: orient to surroundings, keep bed in low position, maintain call bell within reach at all times, provide assistance with transfer out of bed and ambulation.  

## 2022-03-27 NOTE — Assessment & Plan Note (Signed)
Discussed spinal cord stimulation, risks and benefits reviewed and patient would like to hold off on that

## 2022-03-27 NOTE — Assessment & Plan Note (Signed)
Discussed spinal cord stimulation and patient would like to hold off on that

## 2022-04-22 ENCOUNTER — Other Ambulatory Visit: Payer: Self-pay | Admitting: Student in an Organized Health Care Education/Training Program

## 2022-04-30 ENCOUNTER — Encounter: Payer: Self-pay | Admitting: Student in an Organized Health Care Education/Training Program

## 2022-04-30 ENCOUNTER — Ambulatory Visit
Payer: Medicare Other | Attending: Student in an Organized Health Care Education/Training Program | Admitting: Student in an Organized Health Care Education/Training Program

## 2022-04-30 VITALS — BP 115/48 | HR 66 | Temp 97.4°F | Ht 61.0 in | Wt 157.0 lb

## 2022-04-30 DIAGNOSIS — M533 Sacrococcygeal disorders, not elsewhere classified: Secondary | ICD-10-CM | POA: Diagnosis present

## 2022-04-30 DIAGNOSIS — Z96642 Presence of left artificial hip joint: Secondary | ICD-10-CM

## 2022-04-30 DIAGNOSIS — M5416 Radiculopathy, lumbar region: Secondary | ICD-10-CM | POA: Diagnosis present

## 2022-04-30 DIAGNOSIS — M961 Postlaminectomy syndrome, not elsewhere classified: Secondary | ICD-10-CM

## 2022-04-30 DIAGNOSIS — G8929 Other chronic pain: Secondary | ICD-10-CM | POA: Diagnosis present

## 2022-04-30 DIAGNOSIS — G894 Chronic pain syndrome: Secondary | ICD-10-CM | POA: Insufficient documentation

## 2022-04-30 DIAGNOSIS — Z9889 Other specified postprocedural states: Secondary | ICD-10-CM | POA: Diagnosis present

## 2022-04-30 MED ORDER — TRAMADOL HCL ER 100 MG PO TB24: 100.0000 mg | ORAL_TABLET | Freq: Every day | ORAL | 2 refills | Status: DC

## 2022-04-30 NOTE — Progress Notes (Signed)
PROVIDER NOTE: Information contained herein reflects review and annotations entered in association with encounter. Interpretation of such information and data should be left to medically-trained personnel. Information provided to patient can be located elsewhere in the medical record under "Patient Instructions". Document created using STT-dictation technology, any transcriptional errors that may result from process are unintentional.    Patient: [name]  Service Category: E/M  Provider: Edward Jolly, MD  DOB: 08/22/1940  DOS: 04/30/2022  Referring Provider: Patrice Paradise, MD  MRN: [id number]  Specialty: Interventional Pain Management  PCP: Patrice Paradise, MD  Type: Established Patient  Setting: Ambulatory outpatient    Location: Office  Delivery: Face-to-face     HPI  [name], a 82 y.o. year old female, is here today because of her Lumbar radiculopathy [M54.16]. [name] primary complain today is Back Pain (lower)  Pertinent problems: [name] has History of left hip replacement; S/P revision of total knee, right; Chronic pain of right knee; Chronic pain syndrome; and History of lumbar laminectomy for spinal cord decompression (L3-L5) Dr Marcell Barlow 2022 on their pertinent problem list. Pain Assessment: Severity of Chronic pain is reported as a 3 /10. Location: Back Lower/denies. Onset: More than a month ago. Quality: Constant, Aching. Timing: Constant. Modifying factor(s): meds. Vitals:  height is 5\' 1"  (1.549 m) and weight is 157 lb (71.2 kg). Her temporal temperature is 97.4 F (36.3 C) (abnormal). Her blood pressure is 115/48 (abnormal) and her pulse is 66. Her oxygen saturation is 98%.  BMI: Estimated body mass index is 29.66 kg/m as calculated from the following:   Height as of this encounter: 5\' 1"  (1.549 m).   Weight as of this encounter: 157 lb (71.2 kg). Last encounter: 02/21/2022.   Reason for encounter: follow-up evaluation -Patient states that she is  doing much better after medication adjustment last month -Improved quality of life, is sleeping better, is more active and has been able to get out of the house -Currently on Lyrica 50 mg BID and Tramadol 100 mg ER without any side effects would like to continue current regimen  03/26/21 Patient presents today to review her lumbar x-ray and lumbar MRI results.  She continues to endorse persistent and intermittently severe low back pain with radiation into her left groin and left leg at times.  She is also endorsing right knee pain status post right knee replacement in 2011 status post revision of January 2021.  She is accompanied today by her son.  She would also like to discuss medication management options for chronic pain management.   Pharmacotherapy Assessment  Analgesic: tramadol 100 mg ER  Monitoring: [place] PMP: PDMP reviewed during this encounter.       Pharmacotherapy: No side-effects or adverse reactions reported. Compliance: No problems identified. Effectiveness: Clinically acceptable.  Florina Ou, RN  04/30/2022 10:02 AM  Sign when Signing Visit Nursing Pain Medication Assessment:  Safety precautions to be maintained throughout the outpatient stay will include: orient to surroundings, keep bed in low position, maintain call bell within reach at all times, provide assistance with transfer out of bed and ambulation.  Medication Inspection Compliance: Pill count conducted under aseptic conditions, in front of the patient. Neither the pills nor the bottle was removed from the patient's sight at any time. Once count was completed pills were immediately returned to the patient in their original bottle.  Medication: Tramadol (Ultram) Pill/Patch Count:  20 of 30 pills remain Pt states she has 6 pills in  her pill box at home Pill/Patch Appearance: Markings consistent with prescribed medication Bottle Appearance: Standard pharmacy container. Clearly labeled. Filled Date: 04 / 01 /  2024 Last Medication intake:  YesterdaySafety precautions to be maintained throughout the outpatient stay will include: orient to surroundings, keep bed in low position, maintain call bell within reach at all times, provide assistance with transfer out of bed and ambulation.     No results found for: "CBDTHCR" No results found for: "D8THCCBX" No results found for: "D9THCCBX"  UDS:  Summary  Date Value Ref Range Status  11/21/2021 Note  Final    Comment:    ==================================================================== Compliance Drug Analysis, Ur ==================================================================== Test                             Result       Flag       Units  Drug Present and Declared for Prescription Verification   Pregabalin                     PRESENT      EXPECTED   Citalopram                     PRESENT      EXPECTED   Desmethylcitalopram            PRESENT      EXPECTED    Desmethylcitalopram is an expected metabolite of citalopram or the    enantiomeric form, escitalopram.    Acetaminophen                  PRESENT      EXPECTED  Drug Present not Declared for Prescription Verification   Tramadol                       >7937        UNEXPECTED ng/mg creat   O-Desmethyltramadol            7573         UNEXPECTED ng/mg creat   N-Desmethyltramadol            4151         UNEXPECTED ng/mg creat    Source of tramadol is a prescription medication. O-desmethyltramadol    and N-desmethyltramadol are expected metabolites of tramadol.  Drug Absent but Declared for Prescription Verification   Diphenhydramine                Not Detected UNEXPECTED ==================================================================== Test                      Result    Flag   Units      Ref Range   Creatinine              63               mg/dL      >=16 ==================================================================== Declared Medications:  The flagging and interpretation on  this report are based on the  following declared medications.  Unexpected results may arise from  inaccuracies in the declared medications.   **Note: The testing scope of this panel includes these medications:   Diphenhydramine (Tylenol PM)  Escitalopram (Lexapro)  Pregabalin (Lyrica)   **Note: The testing scope of this panel does not include small to  moderate amounts of these reported medications:   Acetaminophen (Tylenol)  Acetaminophen (Tylenol PM)   **  Note: The testing scope of this panel does not include the  following reported medications:   Amlodipine (Norvasc)  Apixaban (Eliquis)  Clindamycin (Cleocin)  Dronedarone (Multaq)  Estradiol (Estrace)  Eye Drops  Pravastatin (Pravachol)  Vitamin D ==================================================================== For clinical consultation, please call [phone]. ====================================================================       ROS  Constitutional: Denies any fever or chills Gastrointestinal: No reported hemesis, hematochezia, vomiting, or acute GI distress Musculoskeletal:  Low back pain with radiation into left groin and left leg, right knee pain, improved from before Neurological: No reported episodes of acute onset apraxia, aphasia, dysarthria, agnosia, amnesia, paralysis, loss of coordination, or loss of consciousness  Medication Review  Vitamin D, acetaminophen, amLODipine, apixaban, carboxymethylcellulose, dronedarone, escitalopram, estradiol, omeprazole, pravastatin, pregabalin, and traMADol  History Review  Allergy: [name] is allergic to aspirin, nsaids, methocarbamol, morphine and related, and oxycodone. Drug: [name]  reports no history of drug use. Alcohol:  reports no history of alcohol use. Tobacco:  reports that she has never smoked. She has never used smokeless tobacco. Social: [name]  reports that she has never smoked. She has never used smokeless tobacco. She reports that  she does not drink alcohol and does not use drugs. Medical:  has a past medical history of Anxiety, Aortic atherosclerosis, Arthritis, Current use of long term anticoagulation, Dysrhythmia, GERD (gastroesophageal reflux disease), History of 2019 novel coronavirus disease (COVID-19) (07/2019), History of kidney stones, Hypercholesteremia, Hypertension, IDA (iron deficiency anemia), Paroxysmal atrial fibrillation, and Skin cancer. Surgical: [name]  has a past surgical history that includes Abdominal hysterectomy; Septoplasty; Cataract extraction w/PHACO (Right, 07/13/2014); Cataract extraction w/PHACO (Left, 10/05/2014); Breast excisional biopsy (Left, 1973); Lumbar laminectomy/decompression microdiscectomy (N/A, 05/10/2020); Breast cyst aspiration (Left, 11/21/2015); Joint replacement; Total hip arthroplasty (Left, 06/16/2020); Colonoscopy; and Total knee revision (Right, 02/15/2021). Family: family history includes CAD in her father; Dementia in her mother; Heart attack in her father.  Laboratory Chemistry Profile   Renal Lab Results  Component Value Date   BUN 19 02/20/2021   CREATININE 1.00 02/20/2021   GFRAA 38 (L) 06/19/2018   GFRNONAA 56 (L) 02/20/2021    Hepatic Lab Results  Component Value Date   AST 13 (L) 02/20/2021   ALT 11 02/20/2021   ALBUMIN 2.9 (L) 02/20/2021   ALKPHOS 58 02/20/2021   AMMONIA <10 02/20/2021    Electrolytes Lab Results  Component Value Date   NA 137 02/20/2021   K 3.4 (L) 02/20/2021   CL 104 02/20/2021   CALCIUM 9.0 02/20/2021    Bone No results found for: "VD25OH", "VD125OH2TOT", "UJ8119JY7", "WG9562ZH0", "25OHVITD1", "25OHVITD2", "25OHVITD3", "TESTOFREE", "TESTOSTERONE"  Inflammation (CRP: Acute Phase) (ESR: Chronic Phase) No results found for: "CRP", "ESRSEDRATE", "LATICACIDVEN"       Note: Above Lab results reviewed.  Recent Imaging Review  MR LUMBAR SPINE WO CONTRAST CLINICAL DATA:  Lumbar radiculopathy M54.16 (ICD-10-CM). History  of lumbar laminectomy for spinal cord decompression Z98.890 (ICD-10-CM). Lumbar post-laminectomy syndrome M96.1 (ICD-10-CM). Chronic pain syndrome G89.4 (ICD-10-CM).  EXAM: MRI LUMBAR SPINE WITHOUT CONTRAST  TECHNIQUE: Multiplanar, multisequence MR imaging of the lumbar spine was performed. No intravenous contrast was administered.  COMPARISON:  MRI of the lumbar spine January 09, 2019.  FINDINGS: Segmentation:  Standard.  Alignment: A 3-4 mm anterolisthesis of L4 and trace anterolisthesis of T12 over L1. Small retrolisthesis at L1-2, L2-3 and L3-4. Levoconvex curvature.  Vertebrae: No fracture, evidence of discitis, or bone lesion. Postsurgical changes from left laminotomy at L3-4 and L4-5. Degenerative endplate changes at L2-3 and  L3-4 with reactive marrow edema at L3-4. L1 inferior endplate degenerative changes with associated Schmorl node and reactive marrow edema.  Conus medullaris and cauda equina: Conus extends to the L2 level. Conus and cauda equina appear normal.  Paraspinal and other soft tissues: Negative.  Disc levels:  T12-L1: Mild anterolisthesis, shallow disc bulge, moderate right and mild left facet degenerative changes without significant spinal canal or neural foraminal stenosis.  L1-2: Loss of disc height, disc bulge with superimposed left subarticular, superiorly migrating disc extrusion and mild facet degenerative changes. Findings result in effacement of the left subarticular zone, likely impinging on the left L1 and L2 nerve roots and mild left neural foraminal narrowing. Findings have developed since prior MRI.  L2-3: Loss of disc height, disc bulge with associated osteophytic component and moderate facet degenerative changes resulting in narrowing of the bilateral subarticular zones, right greater than left, and moderate bilateral neural foraminal narrowing, unchanged.  L3-4: Retrolisthesis, loss of disc height, disc bulge, moderate facet  degenerative changes and ligamentum flavum redundancy. Posttreatment changes from left laminotomy with improved spinal canal patency. Persistent prominent subarticular zone stenosis and severe bilateral neural foraminal narrowing.  L4-5: Anterolisthesis, disc bulge left disc uncovering and advanced hypertrophic facet degenerative changes with ligamentum flavum redundancy on the right side. Postsurgical changes from left laminotomy with improved canal patency. There is persistent prominent narrowing of the bilateral subarticular zones and severe bilateral neural foraminal narrowing.  L5-S1: Shallow disc bulge, advanced facet degenerative changes and ligamentum flavum redundancy. There also 2 synovial cy cyst in the right fossa lateral aspect of the spinal canal causing indentation on the thecal sac and contributing for mild spinal canal stenosis. There is moderate narrowing of the bilateral subarticular zones and moderate bilateral neural foraminal narrowing.  IMPRESSION: 1. At L1-2, new left subarticular, superiorly migrating disc extrusion resulting in effacement of the left subarticular zone, likely impinging on the left L1 and L2 nerve roots. 2. At L2-3, narrowing of the bilateral subarticular zones, right greater than left, and moderate bilateral neural foraminal narrowing, unchanged. 3. At L3-4, prominent narrowing of the bilateral subarticular zones and severe bilateral neural foraminal narrowing, unchanged. 4. At L4-5, prominent narrowing of the bilateral subarticular zones and severe bilateral neural foraminal narrowing, unchanged. 5. At L5-S1, moderate narrowing of the bilateral subarticular zones and moderate bilateral neural foraminal narrowing, unchanged.  Electronically Signed   By: Baldemar Lenis M.D.   On: 03/21/2022 09:51 Note: Reviewed        Physical Exam  General appearance: Well nourished, well developed, and well hydrated. In no apparent  acute distress Mental status: Alert, oriented x 3 (person, place, & time)       Respiratory: No evidence of acute respiratory distress Eyes: PERLA Vitals: BP (!) 115/48 (BP Location: Left Arm, Patient Position: Sitting, Cuff Size: Normal)   Pulse 66   Temp (!) 97.4 F (36.3 C) (Temporal)   Ht 5\' 1"  (1.549 m)   Wt 157 lb (71.2 kg)   SpO2 98%   BMI 29.66 kg/m  BMI: Estimated body mass index is 29.66 kg/m as calculated from the following:   Height as of this encounter: 5\' 1"  (1.549 m).   Weight as of this encounter: 157 lb (71.2 kg). Ideal: Ideal body weight: 47.8 kg (105 lb 6.1 oz) Adjusted ideal body weight: 57.2 kg (126 lb 0.5 oz)  Lumbar Spine Area Exam  Skin & Axial Inspection: Well healed scar from previous spine surgery detected Alignment: Symmetrical Functional ROM:  Pain restricted ROM       Stability: No instability detected Muscle Tone/Strength: Functionally intact. No obvious neuro-muscular anomalies detected. Sensory (Neurological): Musculoskeletal pain pattern   Gait & Posture Assessment  Ambulation: Unassisted Gait: Antalgic gait (limping) Posture: WNL   Lower Extremity Exam      Side: Right lower extremity   Side: Left lower extremity  Stability: No instability observed           Stability: No instability observed          Skin & Extremity Inspection: Skin color, temperature, and hair growth are WNL. No peripheral edema or cyanosis. No masses, redness, swelling, asymmetry, or associated skin lesions. No contractures.   Skin & Extremity Inspection: Skin color, temperature, and hair growth are WNL. No peripheral edema or cyanosis. No masses, redness, swelling, asymmetry, or associated skin lesions. No contractures.  Functional ROM: Pain restricted ROM for hip and knee joints           Functional ROM: Unrestricted ROM                  Muscle Tone/Strength: Functionally intact. No obvious neuro-muscular anomalies detected.   Muscle Tone/Strength: Functionally intact.  No obvious neuro-muscular anomalies detected.  Sensory (Neurological): Arthropathic arthralgia         Sensory (Neurological): Unimpaired        DTR: Patellar: deferred today Achilles: deferred today Plantar: deferred today   DTR: Patellar: deferred today Achilles: deferred today Plantar: deferred today  Palpation: No palpable anomalies   Palpation: No palpable anomalies    Assessment   Diagnosis Status  1. Lumbar radiculopathy   2. Lumbar post-laminectomy syndrome   3. History of lumbar laminectomy for spinal cord decompression   4. History of left hip replacement   5. Chronic left SI joint pain   6. Chronic pain syndrome    Controlled Controlled Controlled    Plan of Care     [name] has a current medication list which includes the following long-term medication(s): amlodipine, dronedarone, eliquis, escitalopram, estradiol, pravastatin, and pregabalin.  Pharmacotherapy (Medications Ordered): Meds ordered this encounter  Medications   traMADol (ULTRAM-ER) 100 MG 24 hr tablet    Sig: Take 1 tablet (100 mg total) by mouth daily.    Dispense:  30 tablet    Refill:  2  Continue Lyrica 50 mg BID, no refills needed Consider TENS unit and lumbar bracing    Follow-up plan:   Return in about 16 weeks (around 08/20/2022) for Medication Management, in person.      Analgesic Pharmacotherapy:  Opioid Analgesics:100 mg tramadol ER, consider buprenorphine  Membrane stabilizer:  Failed gabapentin.  Lyrica  50 mg twice daily  Muscle relaxant: To be determined at a later time  NSAID: Medically contraindicated patient on Eliquis for A-fib  Other analgesic(s): To be determined at a later time       Recent Visits Date Type Provider Dept  03/27/22 Office Visit Edward Jolly, MD [facility]  02/21/22 Office Visit Edward Jolly, MD [facility]  Showing recent visits within past 90 days and meeting all other requirements Today's Visits Date Type  Provider Dept  04/30/22 Office Visit Edward Jolly, MD [facility]  Showing today's visits and meeting all other requirements Future Appointments No visits were found meeting these conditions. Showing future appointments within next 90 days and meeting all other requirements  I discussed the assessment and treatment plan with the patient. The patient was provided  an opportunity to ask questions and all were answered. The patient agreed with the plan and demonstrated an understanding of the instructions.  Patient advised to call back or seek an in-person evaluation if the symptoms or condition worsens.  Duration of encounter: 30minutes.  Total time on encounter, as per AMA guidelines included both the face-to-face and non-face-to-face time personally spent by the physician and/or other qualified health care professional(s) on the day of the encounter (includes time in activities that require the physician or other qualified health care professional and does not include time in activities normally performed by clinical staff). Physician's time may include the following activities when performed: Preparing to see the patient (e.g., pre-charting review of records, searching for previously ordered imaging, lab work, and nerve conduction tests) Review of prior analgesic pharmacotherapies. Reviewing PMP Interpreting ordered tests (e.g., lab work, imaging, nerve conduction tests) Performing post-procedure evaluations, including interpretation of diagnostic procedures Obtaining and/or reviewing separately obtained history Performing a medically appropriate examination and/or evaluation Counseling and educating the patient/family/caregiver Ordering medications, tests, or procedures Referring and communicating with other health care professionals (when not separately reported) Documenting clinical information in the electronic or other health record Independently interpreting results (not  separately reported) and communicating results to the patient/ family/caregiver Care coordination (not separately reported)  Note by: Edward JollyBilal , MD Date: 04/30/2022; Time: 10:43 AM

## 2022-04-30 NOTE — Progress Notes (Signed)
Nursing Pain Medication Assessment:  Safety precautions to be maintained throughout the outpatient stay will include: orient to surroundings, keep bed in low position, maintain call bell within reach at all times, provide assistance with transfer out of bed and ambulation.  Medication Inspection Compliance: Pill count conducted under aseptic conditions, in front of the patient. Neither the pills nor the bottle was removed from the patient's sight at any time. Once count was completed pills were immediately returned to the patient in their original bottle.  Medication: Tramadol (Ultram) Pill/Patch Count:  20 of 30 pills remain Pt states she has 6 pills in her pill box at home Pill/Patch Appearance: Markings consistent with prescribed medication Bottle Appearance: Standard pharmacy container. Clearly labeled. Filled Date: 04 / 01 / 2024 Last Medication intake:  YesterdaySafety precautions to be maintained throughout the outpatient stay will include: orient to surroundings, keep bed in low position, maintain call bell within reach at all times, provide assistance with transfer out of bed and ambulation.

## 2022-08-13 ENCOUNTER — Encounter: Payer: Self-pay | Admitting: Student in an Organized Health Care Education/Training Program

## 2022-08-13 ENCOUNTER — Ambulatory Visit
Payer: Medicare Other | Attending: Student in an Organized Health Care Education/Training Program | Admitting: Student in an Organized Health Care Education/Training Program

## 2022-08-13 VITALS — BP 127/48 | HR 80 | Temp 98.0°F | Ht 61.0 in | Wt 161.0 lb

## 2022-08-13 DIAGNOSIS — M48061 Spinal stenosis, lumbar region without neurogenic claudication: Secondary | ICD-10-CM | POA: Insufficient documentation

## 2022-08-13 DIAGNOSIS — M961 Postlaminectomy syndrome, not elsewhere classified: Secondary | ICD-10-CM | POA: Diagnosis not present

## 2022-08-13 DIAGNOSIS — M5416 Radiculopathy, lumbar region: Secondary | ICD-10-CM | POA: Insufficient documentation

## 2022-08-13 DIAGNOSIS — M533 Sacrococcygeal disorders, not elsewhere classified: Secondary | ICD-10-CM | POA: Diagnosis not present

## 2022-08-13 DIAGNOSIS — Z9889 Other specified postprocedural states: Secondary | ICD-10-CM | POA: Diagnosis present

## 2022-08-13 DIAGNOSIS — Z96642 Presence of left artificial hip joint: Secondary | ICD-10-CM | POA: Insufficient documentation

## 2022-08-13 DIAGNOSIS — G894 Chronic pain syndrome: Secondary | ICD-10-CM | POA: Insufficient documentation

## 2022-08-13 DIAGNOSIS — G8929 Other chronic pain: Secondary | ICD-10-CM | POA: Diagnosis present

## 2022-08-13 MED ORDER — PREGABALIN 50 MG PO CAPS: 50.0000 mg | ORAL_CAPSULE | Freq: Two times a day (BID) | ORAL | 5 refills | Status: AC

## 2022-08-13 MED ORDER — TRAMADOL HCL ER 100 MG PO TB24: 100.0000 mg | ORAL_TABLET | Freq: Every day | ORAL | 2 refills | Status: DC

## 2022-08-13 NOTE — Progress Notes (Signed)
Safety precautions to be maintained throughout the outpatient stay will include: orient to surroundings, keep bed in low position, maintain call bell within reach at all times, provide assistance with transfer out of bed and ambulationNursing Pain Medication Assessment:  Safety precautions to be maintained throughout the outpatient stay will include: orient to surroundings, keep bed in low position, maintain call bell within reach at all times, provide assistance with transfer out of bed and ambulation.  Medication Inspection Compliance: Ms. Deyoe did not comply with our request to bring her pills to be counted. She was reminded that bringing the medication bottles, even when empty, is a requirement.  Medication: Tramadol (Ultram) Pill/Patch Count: No pills available to be counted. Pill/Patch Appearance: No markings Bottle Appearance: No container available. Did not bring bottle(s) to appointment. Filled Date: 0 / 0 /  no bottle available Last Medication intake:  Yesterday.

## 2022-08-13 NOTE — Progress Notes (Signed)
PROVIDER NOTE: Information contained herein reflects review and annotations entered in association with encounter. Interpretation of such information and data should be left to medically-trained personnel. Information provided to patient can be located elsewhere in the medical record under "Patient Instructions". Document created using STT-dictation technology, any transcriptional errors that may result from process are unintentional.    Patient: [name]  Service Category: E/M  Provider: Edward Jolly, MD  DOB: 11/05/1940  DOS: 08/13/2022  Referring Provider: Patrice Paradise, MD  MRN: [id number]  Specialty: Interventional Pain Management  PCP: Patrice Paradise, MD  Type: Established Patient  Setting: Ambulatory outpatient    Location: Office  Delivery: Face-to-face     HPI  [name], a 82 y.o. year old female, is here today because of her Lumbar radiculopathy [M54.16]. [name] complain today is Back Pain (lower)  Pertinent problems: [name] has History of left hip replacement; S/P revision of total knee, right; Chronic pain of right knee; Chronic pain syndrome; and History of lumbar laminectomy for spinal cord decompression (L3-L5) Dr Marcell Barlow 2022 on their pertinent problem list. Pain Assessment: Severity of Chronic pain is reported as a 8 /10. Location: Back Lower/denies. Onset: More than a month ago. Quality: Constant, Aching. Timing: Constant. Modifying factor(s): meds, heat, ice. Vitals:  height is 5\' 1"  (1.549 m) and weight is 161 lb (73 kg). Her temporal temperature is 98 F (36.7 C). Her blood pressure is 127/48 (abnormal) and her pulse is 80. Her oxygen saturation is 98%.  BMI: Estimated body mass index is 30.42 kg/m as calculated from the following:   Height as of this encounter: 5\' 1"  (1.549 m).   Weight as of this encounter: 161 lb (73 kg). Last encounter: 02/21/2022.   Reason for encounter: medication management  -Currently on Lyrica 50 mg BID  and Tramadol 100 mg ER without any side effects would like to continue current regimen -having pain during the day so we discussed adding APAP 500 mg q6 hrs  03/26/21 Patient presents today to review her lumbar x-ray and lumbar MRI results.  She continues to endorse persistent and intermittently severe low back pain with radiation into her left groin and left leg at times.  She is also endorsing right knee pain status post right knee replacement in 2011 status post revision of January 2021.  She is accompanied today by her son.  She would also like to discuss medication management options for chronic pain management.   Pharmacotherapy Assessment  Analgesic: tramadol 100 mg ER  Monitoring: [place] PMP: PDMP reviewed during this encounter.       Pharmacotherapy: No side-effects or adverse reactions reported. Compliance: No problems identified. Effectiveness: Clinically acceptable.  Florina Ou, RN  08/13/2022 10:56 AM  Sign when Signing Visit Safety precautions to be maintained throughout the outpatient stay will include: orient to surroundings, keep bed in low position, maintain call bell within reach at all times, provide assistance with transfer out of bed and ambulationNursing Pain Medication Assessment:  Safety precautions to be maintained throughout the outpatient stay will include: orient to surroundings, keep bed in low position, maintain call bell within reach at all times, provide assistance with transfer out of bed and ambulation.  Medication Inspection Compliance: [name] did not comply with our request to bring her pills to be counted. She was reminded that bringing the medication bottles, even when empty, is a requirement.  Medication: Tramadol (Ultram) Pill/Patch Count: No pills available to be counted. Pill/Patch  Appearance: No markings Bottle Appearance: No container available. Did not bring bottle(s) to appointment. Filled Date: 0 / 0 /  no bottle available Last Medication  intake:  Yesterday.     No results found for: "CBDTHCR" No results found for: "D8THCCBX" No results found for: "D9THCCBX"  UDS:  Summary  Date Value Ref Range Status  11/21/2021 Note  Final    Comment:    ==================================================================== Compliance Drug Analysis, Ur ==================================================================== Test                             Result       Flag       Units  Drug Present and Declared for Prescription Verification   Pregabalin                     PRESENT      EXPECTED   Citalopram                     PRESENT      EXPECTED   Desmethylcitalopram            PRESENT      EXPECTED    Desmethylcitalopram is an expected metabolite of citalopram or the    enantiomeric form, escitalopram.    Acetaminophen                  PRESENT      EXPECTED  Drug Present not Declared for Prescription Verification   Tramadol                       >7937        UNEXPECTED ng/mg creat   O-Desmethyltramadol            7573         UNEXPECTED ng/mg creat   N-Desmethyltramadol            4151         UNEXPECTED ng/mg creat    Source of tramadol is a prescription medication. O-desmethyltramadol    and N-desmethyltramadol are expected metabolites of tramadol.  Drug Absent but Declared for Prescription Verification   Diphenhydramine                Not Detected UNEXPECTED ==================================================================== Test                      Result    Flag   Units      Ref Range   Creatinine              63               mg/dL      >=29 ==================================================================== Declared Medications:  The flagging and interpretation on this report are based on the  following declared medications.  Unexpected results may arise from  inaccuracies in the declared medications.   **Note: The testing scope of this panel includes these medications:   Diphenhydramine (Tylenol PM)   Escitalopram (Lexapro)  Pregabalin (Lyrica)   **Note: The testing scope of this panel does not include small to  moderate amounts of these reported medications:   Acetaminophen (Tylenol)  Acetaminophen (Tylenol PM)   **Note: The testing scope of this panel does not include the  following reported medications:   Amlodipine (Norvasc)  Apixaban (Eliquis)  Clindamycin (Cleocin)  Dronedarone (Multaq)  Estradiol (Estrace)  Eye Drops  Pravastatin (  Pravachol)  Vitamin D ==================================================================== For clinical consultation, please call [phone]. ====================================================================       ROS  Constitutional: Denies any fever or chills Gastrointestinal: No reported hemesis, hematochezia, vomiting, or acute GI distress Musculoskeletal:  Low back pain with radiation into left groin and left leg, right knee pain Neurological: No reported episodes of acute onset apraxia, aphasia, dysarthria, agnosia, amnesia, paralysis, loss of coordination, or loss of consciousness  Medication Review  Vitamin D, acetaminophen, amLODipine, apixaban, carboxymethylcellulose, dronedarone, escitalopram, estradiol, omeprazole, pravastatin, pregabalin, and traMADol  History Review  Allergy: [name] is allergic to aspirin, nsaids, methocarbamol, morphine and codeine, and oxycodone. Drug: [name]  reports no history of drug use. Alcohol:  reports no history of alcohol use. Tobacco:  reports that she has never smoked. She has never used smokeless tobacco. Social: [name]  reports that she has never smoked. She has never used smokeless tobacco. She reports that she does not drink alcohol and does not use drugs. Medical:  has a past medical history of Anxiety, Aortic atherosclerosis (HCC), Arthritis, Current use of long term anticoagulation, Dysrhythmia, GERD (gastroesophageal reflux disease), History of 2019 novel coronavirus  disease (COVID-19) (07/2019), History of kidney stones, Hypercholesteremia, Hypertension, IDA (iron deficiency anemia), Paroxysmal atrial fibrillation (HCC), and Skin cancer. Surgical: [name]  has a past surgical history that includes Abdominal hysterectomy; Septoplasty; Cataract extraction w/PHACO (Right, 07/13/2014); Cataract extraction w/PHACO (Left, 10/05/2014); Breast excisional biopsy (Left, 1973); Lumbar laminectomy/decompression microdiscectomy (N/A, 05/10/2020); Breast cyst aspiration (Left, 11/21/2015); Joint replacement; Total hip arthroplasty (Left, 06/16/2020); Colonoscopy; and Total knee revision (Right, 02/15/2021). Family: family history includes CAD in her father; Dementia in her mother; Heart attack in her father.  Laboratory Chemistry Profile   Renal Lab Results  Component Value Date   BUN 19 02/20/2021   CREATININE 1.00 02/20/2021   GFRAA 38 (L) 06/19/2018   GFRNONAA 56 (L) 02/20/2021    Hepatic Lab Results  Component Value Date   AST 13 (L) 02/20/2021   ALT 11 02/20/2021   ALBUMIN 2.9 (L) 02/20/2021   ALKPHOS 58 02/20/2021   AMMONIA <10 02/20/2021    Electrolytes Lab Results  Component Value Date   NA 137 02/20/2021   K 3.4 (L) 02/20/2021   CL 104 02/20/2021   CALCIUM 9.0 02/20/2021    Bone No results found for: "VD25OH", "VD125OH2TOT", "KV4259DG3", "OV5643PI9", "25OHVITD1", "25OHVITD2", "25OHVITD3", "TESTOFREE", "TESTOSTERONE"  Inflammation (CRP: Acute Phase) (ESR: Chronic Phase) No results found for: "CRP", "ESRSEDRATE", "LATICACIDVEN"       Note: Above Lab results reviewed.  Recent Imaging Review  MR LUMBAR SPINE WO CONTRAST CLINICAL DATA:  Lumbar radiculopathy M54.16 (ICD-10-CM). History of lumbar laminectomy for spinal cord decompression Z98.890 (ICD-10-CM). Lumbar post-laminectomy syndrome M96.1 (ICD-10-CM). Chronic pain syndrome G89.4 (ICD-10-CM).  EXAM: MRI LUMBAR SPINE WITHOUT CONTRAST  TECHNIQUE: Multiplanar, multisequence MR imaging  of the lumbar spine was performed. No intravenous contrast was administered.  COMPARISON:  MRI of the lumbar spine January 09, 2019.  FINDINGS: Segmentation:  Standard.  Alignment: A 3-4 mm anterolisthesis of L4 and trace anterolisthesis of T12 over L1. Small retrolisthesis at L1-2, L2-3 and L3-4. Levoconvex curvature.  Vertebrae: No fracture, evidence of discitis, or bone lesion. Postsurgical changes from left laminotomy at L3-4 and L4-5. Degenerative endplate changes at L2-3 and L3-4 with reactive marrow edema at L3-4. L1 inferior endplate degenerative changes with associated Schmorl node and reactive marrow edema.  Conus medullaris and cauda equina: Conus extends to the L2 level. Conus and cauda equina appear  normal.  Paraspinal and other soft tissues: Negative.  Disc levels:  T12-L1: Mild anterolisthesis, shallow disc bulge, moderate right and mild left facet degenerative changes without significant spinal canal or neural foraminal stenosis.  L1-2: Loss of disc height, disc bulge with superimposed left subarticular, superiorly migrating disc extrusion and mild facet degenerative changes. Findings result in effacement of the left subarticular zone, likely impinging on the left L1 and L2 nerve roots and mild left neural foraminal narrowing. Findings have developed since prior MRI.  L2-3: Loss of disc height, disc bulge with associated osteophytic component and moderate facet degenerative changes resulting in narrowing of the bilateral subarticular zones, right greater than left, and moderate bilateral neural foraminal narrowing, unchanged.  L3-4: Retrolisthesis, loss of disc height, disc bulge, moderate facet degenerative changes and ligamentum flavum redundancy. Posttreatment changes from left laminotomy with improved spinal canal patency. Persistent prominent subarticular zone stenosis and severe bilateral neural foraminal narrowing.  L4-5: Anterolisthesis, disc  bulge left disc uncovering and advanced hypertrophic facet degenerative changes with ligamentum flavum redundancy on the right side. Postsurgical changes from left laminotomy with improved canal patency. There is persistent prominent narrowing of the bilateral subarticular zones and severe bilateral neural foraminal narrowing.  L5-S1: Shallow disc bulge, advanced facet degenerative changes and ligamentum flavum redundancy. There also 2 synovial cy cyst in the right fossa lateral aspect of the spinal canal causing indentation on the thecal sac and contributing for mild spinal canal stenosis. There is moderate narrowing of the bilateral subarticular zones and moderate bilateral neural foraminal narrowing.  IMPRESSION: 1. At L1-2, new left subarticular, superiorly migrating disc extrusion resulting in effacement of the left subarticular zone, likely impinging on the left L1 and L2 nerve roots. 2. At L2-3, narrowing of the bilateral subarticular zones, right greater than left, and moderate bilateral neural foraminal narrowing, unchanged. 3. At L3-4, prominent narrowing of the bilateral subarticular zones and severe bilateral neural foraminal narrowing, unchanged. 4. At L4-5, prominent narrowing of the bilateral subarticular zones and severe bilateral neural foraminal narrowing, unchanged. 5. At L5-S1, moderate narrowing of the bilateral subarticular zones and moderate bilateral neural foraminal narrowing, unchanged.  Electronically Signed   By: Baldemar Lenis M.D.   On: 03/21/2022 09:51 Note: Reviewed        Physical Exam  General appearance: Well nourished, well developed, and well hydrated. In no apparent acute distress Mental status: Alert, oriented x 3 (person, place, & time)       Respiratory: No evidence of acute respiratory distress Eyes: PERLA Vitals: BP (!) 127/48   Pulse 80   Temp 98 F (36.7 C) (Temporal)   Ht 5\' 1"  (1.549 m)   Wt 161 lb (73 kg)    SpO2 98%   BMI 30.42 kg/m  BMI: Estimated body mass index is 30.42 kg/m as calculated from the following:   Height as of this encounter: 5\' 1"  (1.549 m).   Weight as of this encounter: 161 lb (73 kg). Ideal: Ideal body weight: 47.8 kg (105 lb 6.1 oz) Adjusted ideal body weight: 57.9 kg (127 lb 10 oz)  Lumbar Spine Area Exam  Skin & Axial Inspection: Well healed scar from previous spine surgery detected Alignment: Symmetrical Functional ROM: Pain restricted ROM       Stability: No instability detected Muscle Tone/Strength: Functionally intact. No obvious neuro-muscular anomalies detected. Sensory (Neurological): Dermatomal pain pattern Palpation: No palpable anomalies       Provocative Tests: Hyperextension/rotation test: deferred today  Lumbar quadrant test (Kemp's test): (+) bilateral for foraminal stenosis Lateral bending test: (+) ipsilateral radicular pain, bilaterally. Positive for bilateral foraminal stenosis.  Gait & Posture Assessment  Ambulation: Unassisted Gait: Antalgic gait (limping) Posture: WNL   Lower Extremity Exam      Side: Right lower extremity   Side: Left lower extremity  Stability: No instability observed           Stability: No instability observed          Skin & Extremity Inspection: Skin color, temperature, and hair growth are WNL. No peripheral edema or cyanosis. No masses, redness, swelling, asymmetry, or associated skin lesions. No contractures.   Skin & Extremity Inspection: Skin color, temperature, and hair growth are WNL. No peripheral edema or cyanosis. No masses, redness, swelling, asymmetry, or associated skin lesions. No contractures.  Functional ROM: Pain restricted ROM for hip and knee joints           Functional ROM: Unrestricted ROM                  Muscle Tone/Strength: Functionally intact. No obvious neuro-muscular anomalies detected.   Muscle Tone/Strength: Functionally intact. No obvious neuro-muscular anomalies detected.  Sensory  (Neurological): Arthropathic arthralgia         Sensory (Neurological): Unimpaired        DTR: Patellar: deferred today Achilles: deferred today Plantar: deferred today   DTR: Patellar: deferred today Achilles: deferred today Plantar: deferred today  Palpation: No palpable anomalies   Palpation: No palpable anomalies    Assessment   Diagnosis Status  1. Lumbar radiculopathy   2. Lumbar post-laminectomy syndrome   3. History of lumbar laminectomy for spinal cord decompression   4. History of left hip replacement   5. Chronic left SI joint pain   6. Chronic pain syndrome   7. Lumbar foraminal stenosis     Having a Flare-up Controlled Controlled    Plan of Care     [name] has a current medication list which includes the following long-term medication(s): amlodipine, dronedarone, eliquis, escitalopram, estradiol, pravastatin, and pregabalin.  Pharmacotherapy (Medications Ordered): Meds ordered this encounter  Medications   traMADol (ULTRAM-ER) 100 MG 24 hr tablet    Sig: Take 1 tablet (100 mg total) by mouth daily.    Dispense:  30 tablet    Refill:  2   pregabalin (LYRICA) 50 MG capsule    Sig: Take 1 capsule (50 mg total) by mouth 2 (two) times daily.    Dispense:  60 capsule    Refill:  5   Orders Placed This Encounter  Procedures   Lumbar Transforaminal Epidural    Standing Status:   Future    Standing Expiration Date:   11/13/2022    Scheduling Instructions:     Laterality: Bilateral     Level(s): TBD             Sedation: local     Timeframe: ASAP    Order Specific Question:   Where will this procedure be performed?    Answer:   [facility]   Consider spinal cord stimulator trial for postlaminectomy pain syndrome   Follow-up plan:   Return in about 3 months (around 11/21/2022) for Medication Management, in person.      Analgesic Pharmacotherapy:  Opioid Analgesics:100 mg tramadol ER, consider buprenorphine  Membrane stabilizer:   Failed gabapentin.  Lyrica  50 mg twice daily  Muscle relaxant: To be determined at a later time  NSAID: Medically contraindicated patient on Eliquis for A-fib  Other analgesic(s): To be determined at a later time       Recent Visits No visits were found meeting these conditions. Showing recent visits within past 90 days and meeting all other requirements Today's Visits Date Type Provider Dept  08/13/22 Office Visit Edward Jolly, MD [facility]  Showing today's visits and meeting all other requirements Future Appointments No visits were found meeting these conditions. Showing future appointments within next 90 days and meeting all other requirements  I discussed the assessment and treatment plan with the patient. The patient was provided an opportunity to ask questions and all were answered. The patient agreed with the plan and demonstrated an understanding of the instructions.  Patient advised to call back or seek an in-person evaluation if the symptoms or condition worsens.  Duration of encounter: .  Total time on encounter, as per AMA guidelines included both the face-to-face and non-face-to-face time personally spent by the physician and/or other qualified health care professional(s) on the day of the encounter (includes time in activities that require the physician or other qualified health care professional and does not include time in activities normally performed by clinical staff). Physician's time may include the following activities when performed: Preparing to see the patient (e.g., pre-charting review of records, searching for previously ordered imaging, lab work, and nerve conduction tests) Review of prior analgesic pharmacotherapies. Reviewing PMP Interpreting ordered tests (e.g., lab work, imaging, nerve conduction tests) Performing post-procedure evaluations, including interpretation of diagnostic procedures Obtaining and/or reviewing separately  obtained history Performing a medically appropriate examination and/or evaluation Counseling and educating the patient/family/caregiver Ordering medications, tests, or procedures Referring and communicating with other health care professionals (when not separately reported) Documenting clinical information in the electronic or other health record Independently interpreting results (not separately reported) and communicating results to the patient/ family/caregiver Care coordination (not separately reported)  Note by: Edward Jolly, MD Date: 08/13/2022; Time: 11:51 AM

## 2022-08-13 NOTE — Patient Instructions (Signed)

## 2022-08-26 ENCOUNTER — Encounter: Payer: Self-pay | Admitting: Student in an Organized Health Care Education/Training Program

## 2022-08-26 ENCOUNTER — Telehealth: Payer: Self-pay

## 2022-08-26 ENCOUNTER — Ambulatory Visit: Payer: Medicare Other | Admitting: Student in an Organized Health Care Education/Training Program

## 2022-08-26 DIAGNOSIS — M48061 Spinal stenosis, lumbar region without neurogenic claudication: Secondary | ICD-10-CM

## 2022-08-26 DIAGNOSIS — M961 Postlaminectomy syndrome, not elsewhere classified: Secondary | ICD-10-CM

## 2022-08-26 DIAGNOSIS — M5416 Radiculopathy, lumbar region: Secondary | ICD-10-CM

## 2022-08-26 MED ORDER — DEXAMETHASONE SODIUM PHOSPHATE 10 MG/ML IJ SOLN
INTRAMUSCULAR | Status: AC
  Filled 2022-08-26: qty 1

## 2022-08-26 MED ORDER — LIDOCAINE HCL (PF) 2 % IJ SOLN
INTRAMUSCULAR | Status: AC
  Filled 2022-08-26: qty 5

## 2022-08-26 MED ORDER — ROPIVACAINE HCL 2 MG/ML IJ SOLN
INTRAMUSCULAR | Status: AC
  Filled 2022-08-26: qty 20

## 2022-08-26 MED ORDER — IOHEXOL 180 MG/ML  SOLN
INTRAMUSCULAR | Status: AC
  Filled 2022-08-26: qty 20

## 2022-08-26 MED ORDER — SODIUM CHLORIDE (PF) 0.9 % IJ SOLN
INTRAMUSCULAR | Status: AC
  Filled 2022-08-26: qty 10

## 2022-08-26 NOTE — Progress Notes (Signed)
Patient did not stop her Eliqius for TF-ESI Instructed to stop for 3 days prior, reschedule TF ESI

## 2022-08-26 NOTE — Telephone Encounter (Signed)
Dr Cherylann Ratel would like to do a Transforaminal Epidural Steroid injection and would like permission to stop her Eliquis for 3 days.  Thank you

## 2022-08-27 ENCOUNTER — Telehealth: Payer: Self-pay

## 2022-08-27 NOTE — Telephone Encounter (Signed)
Clearance request sent to [facility] at [facility] cardiology. kt

## 2022-08-27 NOTE — Telephone Encounter (Signed)
Cardiologist did not receive request for patient to stop Eliquis for 3 days. Please resend.

## 2022-09-04 ENCOUNTER — Encounter: Payer: Self-pay | Admitting: Student in an Organized Health Care Education/Training Program

## 2022-09-04 ENCOUNTER — Ambulatory Visit
Payer: Medicare Other | Attending: Student in an Organized Health Care Education/Training Program | Admitting: Student in an Organized Health Care Education/Training Program

## 2022-09-04 ENCOUNTER — Ambulatory Visit
Admission: RE | Admit: 2022-09-04 | Discharge: 2022-09-04 | Disposition: A | Discharge status: Home / Self Care | Payer: Medicare Other | Source: Ambulatory Visit | Attending: Student in an Organized Health Care Education/Training Program | Admitting: Student in an Organized Health Care Education/Training Program

## 2022-09-04 VITALS — BP 160/65 | HR 55 | Temp 97.2°F | Resp 15 | Ht 61.0 in | Wt 160.0 lb

## 2022-09-04 DIAGNOSIS — Z9889 Other specified postprocedural states: Secondary | ICD-10-CM | POA: Diagnosis present

## 2022-09-04 DIAGNOSIS — M48061 Spinal stenosis, lumbar region without neurogenic claudication: Secondary | ICD-10-CM | POA: Insufficient documentation

## 2022-09-04 DIAGNOSIS — M5416 Radiculopathy, lumbar region: Secondary | ICD-10-CM | POA: Insufficient documentation

## 2022-09-04 MED ORDER — SODIUM CHLORIDE 0.9% FLUSH
2.0000 mL | Freq: Once | INTRAVENOUS | Status: AC
  Administered 2022-09-04: 2 mL

## 2022-09-04 MED ORDER — LIDOCAINE HCL 2 % IJ SOLN
20.0000 mL | Freq: Once | INTRAMUSCULAR | Status: AC
  Administered 2022-09-04: 400 mg
  Filled 2022-09-04: qty 40

## 2022-09-04 MED ORDER — ROPIVACAINE HCL 2 MG/ML IJ SOLN
2.0000 mL | Freq: Once | INTRAMUSCULAR | Status: AC
  Administered 2022-09-04: 2 mL via EPIDURAL
  Filled 2022-09-04: qty 20

## 2022-09-04 MED ORDER — IOHEXOL 180 MG/ML  SOLN
10.0000 mL | Freq: Once | INTRAMUSCULAR | Status: AC
  Administered 2022-09-04: 10 mL via EPIDURAL
  Filled 2022-09-04: qty 20

## 2022-09-04 MED ORDER — DEXAMETHASONE SODIUM PHOSPHATE 10 MG/ML IJ SOLN
20.0000 mg | Freq: Once | INTRAMUSCULAR | Status: AC
  Administered 2022-09-04: 20 mg
  Filled 2022-09-04: qty 2

## 2022-09-04 NOTE — Patient Instructions (Signed)
Pain Management Discharge Instructions  General Discharge Instructions :  If you need to reach your doctor call: Monday-Friday 8:00 am - 4:00 pm at [phone] or toll free [phone].  After clinic hours [phone] to have operator reach doctor.  Bring all of your medication bottles to all your appointments in the pain clinic.  To cancel or reschedule your appointment with Pain Management please remember to call 24 hours in advance to avoid a fee.  Refer to the educational materials which you have been given on: General Risks, I had my Procedure. Discharge Instructions, Post Sedation.  Post Procedure Instructions:  The drugs you were given will stay in your system until tomorrow, so for the next 24 hours you should not drive, make any legal decisions or drink any alcoholic beverages.  You may eat anything you prefer, but it is better to start with liquids then soups and crackers, and gradually work up to solid foods.  Please notify your doctor immediately if you have any unusual bleeding, trouble breathing or pain that is not related to your normal pain.  Depending on the type of procedure that was done, some parts of your body may feel week and/or numb.  This usually clears up by tonight or the next day.  Walk with the use of an assistive device or accompanied by an adult for the 24 hours.  You may use ice on the affected area for the first 24 hours.  Put ice in a Ziploc bag and cover with a towel and place against area 15 minutes on 15 minutes off.  You may switch to heat after 24 hours.Selective Nerve Root Block Patient Information  Description: Specific nerve roots exit the spinal canal and these nerves can be compressed and inflamed by a bulging disc and bone spurs.  By injecting steroids on the nerve root, we can potentially decrease the inflammation surrounding these nerves, which often leads to decreased pain.  Also, by injecting local anesthesia on the nerve root, this can  provide us helpful information to give to your referring doctor if it decreases your pain.  Selective nerve root blocks can be done along the spine from the neck to the low back depending on the location of your pain.   After numbing the skin with local anesthesia, a small needle is passed to the nerve root and the position of the needle is verified using x-ray pictures.  After the needle is in correct position, we then deposit the medication.  You may experience a pressure sensation while this is being done.  The entire block usually lasts less than 15 minutes.  Conditions that may be treated with selective nerve root blocks: Low back and leg pain Spinal stenosis Diagnostic block prior to potential surgery Neck and arm pain Post laminectomy syndrome  Preparation for the injection:  Do not eat any solid food or dairy products within 8 hours of your appointment. You may drink clear liquids up to 3 hours before an appointment.  Clear liquids include water, black coffee, juice or soda.  No milk or cream please. You may take your regular medications, including pain medications, with a sip of water before your appointment.  Diabetics should hold regular insulin (if taken separately) and take 1/2 normal NPH dose the morning of the procedure.  Carry some sugar containing items with you to your appointment. A driver must accompany you and be prepared to drive you home after your procedure. Bring all your current medications with you. An IV   may be inserted and sedation may be given at the discretion of the physician. A blood pressure cuff, EKG, and other monitors will often be applied during the procedure.  Some patients may need to have extra oxygen administered for a short period. You will be asked to provide medical information, including allergies, prior to the procedure.  We must know immediately if you are taking blood  Thinners (like Coumadin) or if you are allergic to IV iodine contrast  (dye).  Possible side-effects: All are usually temporary Bleeding from needle site Light headedness Numbness and tingling Decreased blood pressure Weakness in arms/legs Pressure sensation in back/neck Pain at injection site (several days)  Possible complications: All are extremely rare Infection Nerve injury Spinal headache (a headache wore with upright position)  Call if you experience: Fever/chills associated with headache or increased back/neck pain Headache worsened by an upright position New onset weakness or numbness of an extremity below the injection site Hives or difficulty breathing (go to the emergency room) Inflammation or drainage at the injection site(s) Severe back/neck pain greater than usual New symptoms which are concerning to you  Please note:  Although the local anesthetic injected can often make your back or neck feel good for several hours after the injection the pain will likely return.  It takes 3-5 days for steroids to work on the nerve root. You may not notice any pain relief for at least one week.  If effective, we will often do a series of 3 injections spaced 3-6 weeks apart to maximally decrease your pain.    If you have any questions, please call [phone]  [facility] 

## 2022-09-04 NOTE — Progress Notes (Signed)
PROVIDER NOTE: Interpretation of information contained herein should be left to medically-trained personnel. Specific patient instructions are provided elsewhere under "Patient Instructions" section of medical record. This document was created in part using STT-dictation technology, any transcriptional errors that may result from this process are unintentional.  Patient: [name] Type: Established DOB: 20-Sep-1940 MRN: [id number] PCP: Patrice Paradise, MD  Service: Procedure DOS: 09/04/2022 Setting: Ambulatory Location: Ambulatory outpatient facility Delivery: Face-to-face Provider: Edward Jolly, MD Specialty: Interventional Pain Management Specialty designation: 09 Location: Outpatient facility Ref. Prov.: Patrice Paradise, MD       Interventional Therapy   Procedure: Lumbar trans-foraminal epidural steroid injection (L-TFESI) #1  Laterality: Bilateral (-50)  Level: L4 nerve root(s) Imaging: Fluoroscopy-guided         Anesthesia: Local anesthesia (1-2% Lidocaine) DOS: 09/04/2022  Performed by: Edward Jolly, MD  Purpose: Diagnostic/Therapeutic Indications: Lumbar radicular pain severe enough to impact quality of life or function. 1. Lumbar radiculopathy   2. Lumbar foraminal stenosis   3. History of lumbar laminectomy for spinal cord decompression    NAS-11 Pain score:   Pre-procedure: 7 /10   Post-procedure: 7 /10     Position / Prep / Materials:  Position: Prone  Prep solution: DuraPrep (Iodine Povacrylex [0.7% available iodine] and Isopropyl Alcohol, 74% w/w) Prep Area: Entire Posterior Lumbosacral Area.  From the lower tip of the scapula down to the tailbone and from flank to flank. Materials:  Tray: Block Needle(s):  Type: Spinal  Gauge (G): 22  Length: 5-in  Qty: 2      H&P (Pre-op Assessment):  Ms. Schwisow is a 82 y.o. (year old), female patient, seen today for interventional treatment. 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 Ms. Wellbrock has a current medication list which includes the following prescription(s): acetaminophen, amlodipine, carboxymethylcellulose, vitamin d, dronedarone, escitalopram, estradiol, omeprazole, pravastatin, pregabalin, tramadol, and eliquis. Her primarily concern today is the Back Pain and Knee Pain (right)  Initial Vital Signs:  Pulse/HCG Rate: (!) 55ECG Heart Rate: 64 Temp: (!) 97.2 F (36.2 C) Resp: 16 BP: 139/62 SpO2: 100 %  BMI: Estimated body mass index is 30.23 kg/m as calculated from the following:   Height as of this encounter: 5\' 1"  (1.549 m).   Weight as of this encounter: 160 lb (72.6 kg).  Risk Assessment: Allergies: Reviewed. She is allergic to aspirin, nsaids, methocarbamol, morphine and codeine, and oxycodone.  Allergy Precautions: None required Coagulopathies: Reviewed. None identified.  Blood-thinner therapy: None at this time Active Infection(s): Reviewed. None identified. Ms. Coral is afebrile  Site Confirmation: Ms. Manly was asked to confirm the procedure and laterality before marking the site Procedure checklist: Completed Consent: Before the procedure and under the influence of no sedative(s), amnesic(s), or anxiolytics, the patient was informed of the treatment options, risks and possible complications. To fulfill our ethical and legal obligations, as recommended by the American Medical Association's Code of Ethics, I have informed the patient of my clinical impression; the nature and purpose of the treatment or procedure; the risks, benefits, and possible complications of  the intervention; the alternatives, including doing nothing; the risk(s) and benefit(s) of the alternative treatment(s) or procedure(s); and the risk(s) and benefit(s) of doing nothing. The patient was provided information  about the general risks and possible complications associated with the procedure. These may include, but are not limited to: failure to achieve desired goals, infection, bleeding, organ or nerve damage, allergic reactions, paralysis, and death. In addition, the patient was informed of those risks and complications associated to Spine-related procedures, such as failure to decrease pain; infection (i.e.: Meningitis, epidural or intraspinal abscess); bleeding (i.e.: epidural hematoma, subarachnoid hemorrhage, or any other type of intraspinal or peri-dural bleeding); organ or nerve damage (i.e.: Any type of peripheral nerve, nerve root, or spinal cord injury) with subsequent damage to sensory, motor, and/or autonomic systems, resulting in permanent pain, numbness, and/or weakness of one or several areas of the body; allergic reactions; (i.e.: anaphylactic reaction); and/or death. Furthermore, the patient was informed of those risks and complications associated with the medications. These include, but are not limited to: allergic reactions (i.e.: anaphylactic or anaphylactoid reaction(s)); adrenal axis suppression; blood sugar elevation that in diabetics may result in ketoacidosis or comma; water retention that in patients with history of congestive heart failure may result in shortness of breath, pulmonary edema, and decompensation with resultant heart failure; weight gain; swelling or edema; medication-induced neural toxicity; particulate matter embolism and blood vessel occlusion with resultant organ, and/or nervous system infarction; and/or aseptic necrosis of one or more joints. Finally, the patient was informed that Medicine is not an exact science; therefore, there is also the possibility  of unforeseen or unpredictable risks and/or possible complications that may result in a catastrophic outcome. The patient indicated having understood very clearly. We have given the patient no guarantees and we have made no promises. Enough time was given to the patient to ask questions, all of which were answered to the patient's satisfaction. Ms. Liff has indicated that she wanted to continue with the procedure. Attestation: I, the ordering provider, attest that I have discussed with the patient the benefits, risks, side-effects, alternatives, likelihood of achieving goals, and potential problems during recovery for the procedure that I have provided informed consent. Date  Time: 09/04/2022 11:22 AM   Pre-Procedure Preparation:  Monitoring: As per clinic protocol. Respiration, ETCO2, SpO2, BP, heart rate and rhythm monitor placed and checked for adequate function Safety Precautions: Patient was assessed for positional comfort and pressure points before starting the procedure. Time-out: I initiated and conducted the "Time-out" before starting the procedure, as per protocol. The patient was asked to participate by confirming the accuracy of the "Time Out" information. Verification of the correct person, site, and procedure were performed and confirmed by me, the nursing staff, and the patient. "Time-out" conducted as per Joint Commission's Universal Protocol (UP.01.01.01). Time: 1145 Start Time: 1145 hrs.  Description/Narrative of Procedure:          Target: The 6 o'clock position under the pedicle, on the affected side. Region: Posterolateral Lumbosacral Approach: Posterior Percutaneous Paravertebral approach.  Rationale (medical necessity): procedure needed and proper for the diagnosis and/or treatment of the patient's medical symptoms and needs. Procedural Technique Safety Precautions: Aspiration looking for blood return was conducted prior to all injections. At no point did we inject any  substances, as a needle was being advanced. No attempts were made at seeking any paresthesias. Safe injection practices and needle disposal techniques used. Medications properly checked for expiration dates. SDV (single dose vial) medications used. Description of the Procedure: Protocol guidelines were followed. The patient was placed in position over the procedure table. The target area was identified and the area prepped in the usual manner. Skin & deeper tissues  infiltrated with local anesthetic. Appropriate amount of time allowed to pass for local anesthetics to take effect. The procedure needles were then advanced to the target area. Proper needle placement secured. Negative aspiration confirmed. Solution injected in intermittent fashion, asking for systemic symptoms every 0.5cc of injectate. The needles were then removed and the area cleansed, making sure to leave some of the prepping solution back to take advantage of its long term bactericidal properties.  Vitals:   09/04/22 1127 09/04/22 1144 09/04/22 1149 09/04/22 1155  BP: 139/62 (!) 160/66 (!) 176/65 (!) 160/65  Pulse: (!) 55     Resp: 16 18 17 15   Temp: (!) 97.2 F (36.2 C)     SpO2: 100% 96% 95% 96%  Weight: 160 lb (72.6 kg)     Height: 5\' 1"  (1.549 m)       Start Time: 1145 hrs. End Time: 1153 hrs.  Imaging Guidance (Spinal):          Type of Imaging Technique: Fluoroscopy Guidance (Spinal) Indication(s): Assistance in needle guidance and placement for procedures requiring needle placement in or near specific anatomical locations not easily accessible without such assistance. Exposure Time: Please see nurses notes. Contrast: Before injecting any contrast, we confirmed that the patient did not have an allergy to iodine, shellfish, or radiological contrast. Once satisfactory needle placement was completed at the desired level, radiological contrast was injected. Contrast injected under live fluoroscopy. No contrast complications.  See chart for type and volume of contrast used. Fluoroscopic Guidance: I was personally present during the use of fluoroscopy. "Tunnel Vision Technique" used to obtain the best possible view of the target area. Parallax error corrected before commencing the procedure. "Direction-depth-direction" technique used to introduce the needle under continuous pulsed fluoroscopy. Once target was reached, antero-posterior, oblique, and lateral fluoroscopic projection used confirm needle placement in all planes. Images permanently stored in EMR. Interpretation: I personally interpreted the imaging intraoperatively. Adequate needle placement confirmed in multiple planes. Appropriate spread of contrast into desired area was observed. No evidence of afferent or efferent intravascular uptake. No intrathecal or subarachnoid spread observed. Permanent images saved into the patient's record.  Post-operative Assessment:  Post-procedure Vital Signs:  Pulse/HCG Rate: (!) 5570 Temp: (!) 97.2 F (36.2 C) Resp: 15 BP: (!) 160/65 SpO2: 96 %  EBL: None  Complications: No immediate post-treatment complications observed by team, or reported by patient.  Note: The patient tolerated the entire procedure well. A repeat set of vitals were taken after the procedure and the patient was kept under observation following institutional policy, for this type of procedure. Post-procedural neurological assessment was performed, showing return to baseline, prior to discharge. The patient was provided with post-procedure discharge instructions, including a section on how to identify potential problems. Should any problems arise concerning this procedure, the patient was given instructions to immediately contact us, at any time, without hesitation. In any case, we plan to contact the patient by telephone for a follow-up status report regarding this interventional procedure.  Comments:  No additional relevant information.  Plan of Care  (POC)  Orders:  Orders Placed This Encounter  Procedures   DG PAIN CLINIC C-ARM 1-60 MIN NO REPORT    Intraoperative interpretation by procedural physician at [facility].    Standing Status:   Standing    Number of Occurrences:   1    Order Specific Question:   Reason for exam:    Answer:   Assistance in needle guidance and placement for procedures requiring needle  placement in or near specific anatomical locations not easily accessible without such assistance.     Medications ordered for procedure: Meds ordered this encounter  Medications   iohexol (OMNIPAQUE) 180 MG/ML injection 10 mL    Must be Myelogram-compatible. If not available, you may substitute with a water-soluble, non-ionic, hypoallergenic, myelogram-compatible radiological contrast medium.   lidocaine (XYLOCAINE) 2 % (with pres) injection 400 mg   sodium chloride flush (NS) 0.9 % injection 2 mL    This is for a two (2) level block. Use two (2) syringes and divide content in half.   ropivacaine (PF) 2 mg/mL (0.2%) (NAROPIN) injection 2 mL    This is for a two (2) level block. Use two (2) syringes and divide content in half.   dexamethasone (DECADRON) injection 20 mg    This is for a two (2) level block. Use two (2) syringes and divide content in half.   Medications administered: We administered iohexol, lidocaine, sodium chloride flush, ropivacaine (PF) 2 mg/mL (0.2%), and dexamethasone.  See the medical record for exact dosing, route, and time of administration.  Follow-up plan:   Return for Keep sch. appt.       Analgesic Pharmacotherapy:  Opioid Analgesics:100 mg tramadol ER, consider buprenorphine  Membrane stabilizer:  Failed gabapentin.  Lyrica  50 mg twice daily  Muscle relaxant: To be determined at a later time  NSAID: Medically contraindicated patient on Eliquis for A-fib  Other analgesic(s): To be determined at a later time        Recent Visits Date Type Provider Dept  08/13/22 Office  Visit Edward Jolly, MD [facility]  Showing recent visits within past 90 days and meeting all other requirements Today's Visits Date Type Provider Dept  09/04/22 Procedure visit Edward Jolly, MD [facility]  Showing today's visits and meeting all other requirements Future Appointments Date Type Provider Dept  11/12/22 Appointment Edward Jolly, MD [facility]  Showing future appointments within next 90 days and meeting all other requirements  Disposition: Discharge home  Discharge (Date  Time): 09/04/2022;   hrs.   Primary Care Physician: Patrice Paradise, MD Location: [facility] Note by: Edward Jolly, MD (TTS technology used. I apologize for any typographical errors that were not detected and corrected.) Date: 09/04/2022; Time: 12:06 PM  Disclaimer:  Medicine is not an Visual merchandiser. The only guarantee in medicine is that nothing is guaranteed. It is important to note that the decision to proceed with this intervention was based on the information collected from the patient. The Data and conclusions were drawn from the patient's questionnaire, the interview, and the physical examination. Because the information was provided in large part by the patient, it cannot be guaranteed that it has not been purposely or unconsciously manipulated. Every effort has been made to obtain as much relevant data as possible for this evaluation. It is important to note that the conclusions that lead to this procedure are derived in large part from the available data. Always take into account that the treatment will also be dependent on availability of resources and existing treatment guidelines, considered by other Pain Management Practitioners as being common knowledge and practice, at the time of the intervention. For Medico-Legal purposes, it is also important to point out that variation in procedural techniques and pharmacological choices are the  acceptable norm. The indications, contraindications, technique, and results of the above procedure should only be interpreted and judged by a Board-Certified Interventional Pain Specialist  with extensive familiarity and expertise in the same exact procedure and technique.

## 2022-09-05 ENCOUNTER — Telehealth: Payer: Self-pay | Admitting: *Deleted

## 2022-09-05 NOTE — Telephone Encounter (Signed)
Called for post procedure check. No answer. LVM. 

## 2022-10-01 ENCOUNTER — Other Ambulatory Visit: Payer: Self-pay | Admitting: Physician Assistant

## 2022-10-01 DIAGNOSIS — Z1231 Encounter for screening mammogram for malignant neoplasm of breast: Secondary | ICD-10-CM

## 2022-10-18 ENCOUNTER — Other Ambulatory Visit: Payer: Self-pay | Admitting: Student in an Organized Health Care Education/Training Program

## 2022-10-23 ENCOUNTER — Telehealth: Payer: Self-pay | Admitting: Student in an Organized Health Care Education/Training Program

## 2022-10-23 NOTE — Telephone Encounter (Signed)
Pharmacy called stated that they needed a new prescription to be send in for patient Tramadol. Pharmacy states that [facility] will not send over prescription. Please give patient a call. TY

## 2022-10-24 NOTE — Telephone Encounter (Signed)
Called [facility], they filled her Tramadol ER on 10-18-22. I informed patient that was the last refill, she will need to come for her appt in order to receive additional fills of Tramadol ER.

## 2022-11-04 ENCOUNTER — Other Ambulatory Visit: Payer: Self-pay | Admitting: Student in an Organized Health Care Education/Training Program

## 2022-11-04 ENCOUNTER — Ambulatory Visit
Admission: RE | Admit: 2022-11-04 | Discharge: 2022-11-04 | Disposition: A | Discharge status: Home / Self Care | Payer: Medicare Other | Source: Ambulatory Visit | Attending: Physician Assistant | Admitting: Physician Assistant

## 2022-11-04 DIAGNOSIS — Z1231 Encounter for screening mammogram for malignant neoplasm of breast: Secondary | ICD-10-CM | POA: Diagnosis present

## 2022-11-12 ENCOUNTER — Ambulatory Visit
Payer: Medicare Other | Attending: Student in an Organized Health Care Education/Training Program | Admitting: Student in an Organized Health Care Education/Training Program

## 2022-11-12 ENCOUNTER — Ambulatory Visit
Admission: RE | Admit: 2022-11-12 | Discharge: 2022-11-12 | Disposition: A | Discharge status: Home / Self Care | Payer: Medicare Other | Source: Ambulatory Visit | Attending: Student in an Organized Health Care Education/Training Program | Admitting: Student in an Organized Health Care Education/Training Program

## 2022-11-12 ENCOUNTER — Encounter: Payer: Self-pay | Admitting: Student in an Organized Health Care Education/Training Program

## 2022-11-12 VITALS — BP 135/58 | HR 58 | Temp 98.4°F | Ht 61.0 in | Wt 160.0 lb

## 2022-11-12 DIAGNOSIS — Z96651 Presence of right artificial knee joint: Secondary | ICD-10-CM | POA: Diagnosis present

## 2022-11-12 DIAGNOSIS — Z9889 Other specified postprocedural states: Secondary | ICD-10-CM | POA: Diagnosis present

## 2022-11-12 DIAGNOSIS — G894 Chronic pain syndrome: Secondary | ICD-10-CM | POA: Diagnosis present

## 2022-11-12 DIAGNOSIS — M25561 Pain in right knee: Secondary | ICD-10-CM | POA: Diagnosis present

## 2022-11-12 DIAGNOSIS — G8929 Other chronic pain: Secondary | ICD-10-CM | POA: Diagnosis present

## 2022-11-12 DIAGNOSIS — Z96642 Presence of left artificial hip joint: Secondary | ICD-10-CM | POA: Insufficient documentation

## 2022-11-12 DIAGNOSIS — M5416 Radiculopathy, lumbar region: Secondary | ICD-10-CM | POA: Insufficient documentation

## 2022-11-12 DIAGNOSIS — M48061 Spinal stenosis, lumbar region without neurogenic claudication: Secondary | ICD-10-CM | POA: Diagnosis present

## 2022-11-12 DIAGNOSIS — M961 Postlaminectomy syndrome, not elsewhere classified: Secondary | ICD-10-CM | POA: Insufficient documentation

## 2022-11-12 DIAGNOSIS — M549 Dorsalgia, unspecified: Secondary | ICD-10-CM | POA: Diagnosis present

## 2022-11-12 MED ORDER — TRAMADOL HCL ER 100 MG PO TB24: 100.0000 mg | ORAL_TABLET | Freq: Every day | ORAL | 2 refills | Status: AC

## 2022-11-12 NOTE — Progress Notes (Signed)
Nursing Pain Medication Assessment:  Safety precautions to be maintained throughout the outpatient stay will include: orient to surroundings, keep bed in low position, maintain call bell within reach at all times, provide assistance with transfer out of bed and ambulation.  Medication Inspection Compliance: Pill count conducted under aseptic conditions, in front of the patient. Neither the pills nor the bottle was removed from the patient's sight at any time. Once count was completed pills were immediately returned to the patient in their original bottle.  Medication: See above Pill/Patch Count:  9 of 30 pills remain Pill/Patch Appearance: Markings consistent with prescribed medication Bottle Appearance: Standard pharmacy container. Clearly labeled. Filled Date: 8 / 62 / 2024 Last Medication intake:  YesterdaySafety precautions to be maintained throughout the outpatient stay will include: orient to surroundings, keep bed in low position, maintain call bell within reach at all times, provide assistance with transfer out of bed and ambulation.

## 2022-11-12 NOTE — Progress Notes (Signed)
PROVIDER NOTE: Information contained herein reflects review and annotations entered in association with encounter. Interpretation of such information and data should be left to medically-trained personnel. Information provided to patient can be located elsewhere in the medical record under "Patient Instructions". Document created using STT-dictation technology, any transcriptional errors that may result from process are unintentional.    Patient: [name]  Service Category: E/M  Provider: Edward Jolly, MD  DOB: 31-Aug-1940  DOS: 11/12/2022  Referring Provider: Patrice Paradise, MD  MRN: [id number]  Specialty: Interventional Pain Management  PCP: Patrice Paradise, MD  Type: Established Patient  Setting: Ambulatory outpatient    Location: Office  Delivery: Face-to-face     HPI  [name], a 82 y.o. year old female, is here today because of her Lumbar radiculopathy [M54.16]. [name] primary complain today is Knee Pain (right)  Pertinent problems: [name] has History of left hip replacement; S/P revision of total knee, right; Chronic pain of right knee; Chronic pain syndrome; and History of lumbar laminectomy for spinal cord decompression (L3-L5) Dr Marcell Barlow 2022 on their pertinent problem list. Pain Assessment: Severity of Chronic pain is reported as a 6 /10. Location: Knee Right/Denies. Onset: More than a month ago. Quality: Aching, Burning, Throbbing. Timing: Constant. Modifying factor(s): Meds, ice, heat. Vitals:  height is 5\' 1"  (1.549 m) and weight is 160 lb (72.6 kg). Her temperature is 98.4 F (36.9 C). Her blood pressure is 135/58 (abnormal) and her pulse is 58 (abnormal). Her oxygen saturation is 93%.  BMI: Estimated body mass index is 30.23 kg/m as calculated from the following:   Height as of this encounter: 5\' 1"  (1.549 m).   Weight as of this encounter: 160 lb (72.6 kg). Last encounter: 08/13/2022. Last procedure: 09/04/2022.  Reason for encounter: both,  medication management and post-procedure evaluation and assessment.    Post-procedure evaluation   Procedure: Lumbar trans-foraminal epidural steroid injection (L-TFESI) #1  Laterality: Bilateral (-50)  Level: L4 nerve root(s) Imaging: Fluoroscopy-guided         Anesthesia: Local anesthesia (1-2% Lidocaine) DOS: 09/04/2022  Performed by: Edward Jolly, MD  Purpose: Diagnostic/Therapeutic Indications: Lumbar radicular pain severe enough to impact quality of life or function. 1. Lumbar radiculopathy   2. Lumbar foraminal stenosis   3. History of lumbar laminectomy for spinal cord decompression    NAS-11 Pain score:   Pre-procedure: 7 /10   Post-procedure: 7 /10      Effectiveness:  Initial hour after procedure: 100 %  Subsequent 4-6 hours post-procedure: 100 %  Analgesia past initial 6 hours: 75 %  Ongoing improvement:  Analgesic:  60% Function: Somewhat improved ROM: Somewhat improved   Pharmacotherapy Assessment  Analgesic: Tramadol 100 mg ER  Monitoring: [place] PMP: PDMP reviewed during this encounter.       Pharmacotherapy: No side-effects or adverse reactions reported. Compliance: No problems identified. Effectiveness: Clinically acceptable.  Brigitte Pulse, RN  11/12/2022 11:03 AM  Sign when Signing Visit Nursing Pain Medication Assessment:  Safety precautions to be maintained throughout the outpatient stay will include: orient to surroundings, keep bed in low position, maintain call bell within reach at all times, provide assistance with transfer out of bed and ambulation.  Medication Inspection Compliance: Pill count conducted under aseptic conditions, in front of the patient. Neither the pills nor the bottle was removed from the patient's sight at any time. Once count was completed pills were immediately returned to the patient in their original bottle.  Medication: See  above Pill/Patch Count:  9 of 30 pills remain Pill/Patch Appearance: Markings consistent with  prescribed medication Bottle Appearance: Standard pharmacy container. Clearly labeled. Filled Date: 8 / 23 / 2024 Last Medication intake:  YesterdaySafety precautions to be maintained throughout the outpatient stay will include: orient to surroundings, keep bed in low position, maintain call bell within reach at all times, provide assistance with transfer out of bed and ambulation.   No results found for: "CBDTHCR" No results found for: "D8THCCBX" No results found for: "D9THCCBX"  UDS:  Summary  Date Value Ref Range Status  11/21/2021 Note  Final    Comment:    ==================================================================== Compliance Drug Analysis, Ur ==================================================================== Test                             Result       Flag       Units  Drug Present and Declared for Prescription Verification   Pregabalin                     PRESENT      EXPECTED   Citalopram                     PRESENT      EXPECTED   Desmethylcitalopram            PRESENT      EXPECTED    Desmethylcitalopram is an expected metabolite of citalopram or the    enantiomeric form, escitalopram.    Acetaminophen                  PRESENT      EXPECTED  Drug Present not Declared for Prescription Verification   Tramadol                       >7937        UNEXPECTED ng/mg creat   O-Desmethyltramadol            7573         UNEXPECTED ng/mg creat   N-Desmethyltramadol            4151         UNEXPECTED ng/mg creat    Source of tramadol is a prescription medication. O-desmethyltramadol    and N-desmethyltramadol are expected metabolites of tramadol.  Drug Absent but Declared for Prescription Verification   Diphenhydramine                Not Detected UNEXPECTED ==================================================================== Test                      Result    Flag   Units      Ref Range   Creatinine              63               mg/dL       >=16 ==================================================================== Declared Medications:  The flagging and interpretation on this report are based on the  following declared medications.  Unexpected results may arise from  inaccuracies in the declared medications.   **Note: The testing scope of this panel includes these medications:   Diphenhydramine (Tylenol PM)  Escitalopram (Lexapro)  Pregabalin (Lyrica)   **Note: The testing scope of this panel does not include small to  moderate amounts of these reported medications:   Acetaminophen (Tylenol)  Acetaminophen (Tylenol  PM)   **Note: The testing scope of this panel does not include the  following reported medications:   Amlodipine (Norvasc)  Apixaban (Eliquis)  Clindamycin (Cleocin)  Dronedarone (Multaq)  Estradiol (Estrace)  Eye Drops  Pravastatin (Pravachol)  Vitamin D ==================================================================== For clinical consultation, please call [phone]. ====================================================================       ROS  Constitutional: Denies any fever or chills Gastrointestinal: No reported hemesis, hematochezia, vomiting, or acute GI distress Musculoskeletal:  Low back and radiating leg pain Neurological: No reported episodes of acute onset apraxia, aphasia, dysarthria, agnosia, amnesia, paralysis, loss of coordination, or loss of consciousness  Medication Review  Vitamin D, acetaminophen, amLODipine, apixaban, carboxymethylcellulose, dronedarone, escitalopram, estradiol, omeprazole, pravastatin, pregabalin, and traMADol  History Review  Allergy: [name] is allergic to aspirin, nsaids, methocarbamol, morphine and codeine, and oxycodone. Drug: [name]  reports no history of drug use. Alcohol:  reports no history of alcohol use. Tobacco:  reports that she has never smoked. She has never used smokeless tobacco. Social: [name]  reports that she has  never smoked. She has never used smokeless tobacco. She reports that she does not drink alcohol and does not use drugs. Medical:  has a past medical history of Anxiety, Aortic atherosclerosis (HCC), Arthritis, Current use of long term anticoagulation, Dysrhythmia, GERD (gastroesophageal reflux disease), History of 2019 novel coronavirus disease (COVID-19) (07/2019), History of kidney stones, Hypercholesteremia, Hypertension, IDA (iron deficiency anemia), Paroxysmal atrial fibrillation (HCC), and Skin cancer. Surgical: [name]  has a past surgical history that includes Abdominal hysterectomy; Septoplasty; Cataract extraction w/PHACO (Right, 07/13/2014); Cataract extraction w/PHACO (Left, 10/05/2014); Breast excisional biopsy (Left, 1973); Lumbar laminectomy/decompression microdiscectomy (N/A, 05/10/2020); Breast cyst aspiration (Left, 11/21/2015); Joint replacement; Total hip arthroplasty (Left, 06/16/2020); Colonoscopy; and Total knee revision (Right, 02/15/2021). Family: family history includes CAD in her father; Dementia in her mother; Heart attack in her father.  Laboratory Chemistry Profile   Renal Lab Results  Component Value Date   BUN 19 02/20/2021   CREATININE 1.00 02/20/2021   GFRAA 38 (L) 06/19/2018   GFRNONAA 56 (L) 02/20/2021    Hepatic Lab Results  Component Value Date   AST 13 (L) 02/20/2021   ALT 11 02/20/2021   ALBUMIN 2.9 (L) 02/20/2021   ALKPHOS 58 02/20/2021   AMMONIA <10 02/20/2021    Electrolytes Lab Results  Component Value Date   NA 137 02/20/2021   K 3.4 (L) 02/20/2021   CL 104 02/20/2021   CALCIUM 9.0 02/20/2021    Bone No results found for: "VD25OH", "VD125OH2TOT", "EX5284XL2", "GM0102VO5", "25OHVITD1", "25OHVITD2", "25OHVITD3", "TESTOFREE", "TESTOSTERONE"  Inflammation (CRP: Acute Phase) (ESR: Chronic Phase) No results found for: "CRP", "ESRSEDRATE", "LATICACIDVEN"       Note: Above Lab results reviewed.   Physical Exam  General appearance: Well  nourished, well developed, and well hydrated. In no apparent acute distress Mental status: Alert, oriented x 3 (person, place, & time)       Respiratory: No evidence of acute respiratory distress Eyes: PERLA Vitals: BP (!) 135/58   Pulse (!) 58   Temp 98.4 F (36.9 C)   Ht 5\' 1"  (1.549 m)   Wt 160 lb (72.6 kg)   SpO2 93%   BMI 30.23 kg/m  BMI: Estimated body mass index is 30.23 kg/m as calculated from the following:   Height as of this encounter: 5\' 1"  (1.549 m).   Weight as of this encounter: 160 lb (72.6 kg). Ideal: Ideal body weight: 47.8 kg (105 lb 6.1 oz) Adjusted ideal body  weight: 57.7 kg (127 lb 3.7 oz)  Lumbar Spine Area Exam  Skin & Axial Inspection: Well healed scar from previous spine surgery detected Alignment: Symmetrical Functional ROM: Pain restricted ROM       Stability: No instability detected Muscle Tone/Strength: Functionally intact. No obvious neuro-muscular anomalies detected. Sensory (Neurological): Dermatomal pain pattern Palpation: No palpable anomalies       Provocative Tests: Hyperextension/rotation test: deferred today       Lumbar quadrant test (Kemp's test): (+) bilateral for foraminal stenosis Lateral bending test: (+) ipsilateral radicular pain, bilaterally. Positive for bilateral foraminal stenosis.   Gait & Posture Assessment  Ambulation: Unassisted Gait: Antalgic gait (limping) Posture: WNL    Lower Extremity Exam      Side: Right lower extremity   Side: Left lower extremity  Stability: No instability observed           Stability: No instability observed          Skin & Extremity Inspection: Skin color, temperature, and hair growth are WNL. No peripheral edema or cyanosis. No masses, redness, swelling, asymmetry, or associated skin lesions. No contractures.   Skin & Extremity Inspection: Skin color, temperature, and hair growth are WNL. No peripheral edema or cyanosis. No masses, redness, swelling, asymmetry, or associated skin lesions. No  contractures.  Functional ROM: Pain restricted ROM for hip and knee joints           Functional ROM: Unrestricted ROM                  Muscle Tone/Strength: Functionally intact. No obvious neuro-muscular anomalies detected.   Muscle Tone/Strength: Functionally intact. No obvious neuro-muscular anomalies detected.  Sensory (Neurological): Arthropathic arthralgia         Sensory (Neurological): Unimpaired        DTR: Patellar: deferred today Achilles: deferred today Plantar: deferred today   DTR: Patellar: deferred today Achilles: deferred today Plantar: deferred today  Palpation: No palpable anomalies   Palpation: No palpable anomalies      MR LUMBAR SPINE WO CONTRAST CLINICAL DATA:  Lumbar radiculopathy M54.16 (ICD-10-CM). History of lumbar laminectomy for spinal cord decompression Z98.890 (ICD-10-CM). Lumbar post-laminectomy syndrome M96.1 (ICD-10-CM). Chronic pain syndrome G89.4 (ICD-10-CM).   EXAM: MRI LUMBAR SPINE WITHOUT CONTRAST   TECHNIQUE: Multiplanar, multisequence MR imaging of the lumbar spine was performed. No intravenous contrast was administered.   COMPARISON:  MRI of the lumbar spine January 09, 2019.   FINDINGS: Segmentation:  Standard.   Alignment: A 3-4 mm anterolisthesis of L4 and trace anterolisthesis of T12 over L1. Small retrolisthesis at L1-2, L2-3 and L3-4. Levoconvex curvature.   Vertebrae: No fracture, evidence of discitis, or bone lesion. Postsurgical changes from left laminotomy at L3-4 and L4-5. Degenerative endplate changes at L2-3 and L3-4 with reactive marrow edema at L3-4. L1 inferior endplate degenerative changes with associated Schmorl node and reactive marrow edema.   Conus medullaris and cauda equina: Conus extends to the L2 level. Conus and cauda equina appear normal.   Paraspinal and other soft tissues: Negative.   Disc levels:   T12-L1: Mild anterolisthesis, shallow disc bulge, moderate right and mild left facet  degenerative changes without significant spinal canal or neural foraminal stenosis.   L1-2: Loss of disc height, disc bulge with superimposed left subarticular, superiorly migrating disc extrusion and mild facet degenerative changes. Findings result in effacement of the left subarticular zone, likely impinging on the left L1 and L2 nerve roots and mild left neural foraminal narrowing. Findings have developed  since prior MRI.   L2-3: Loss of disc height, disc bulge with associated osteophytic component and moderate facet degenerative changes resulting in narrowing of the bilateral subarticular zones, right greater than left, and moderate bilateral neural foraminal narrowing, unchanged.   L3-4: Retrolisthesis, loss of disc height, disc bulge, moderate facet degenerative changes and ligamentum flavum redundancy. Posttreatment changes from left laminotomy with improved spinal canal patency. Persistent prominent subarticular zone stenosis and severe bilateral neural foraminal narrowing.   L4-5: Anterolisthesis, disc bulge left disc uncovering and advanced hypertrophic facet degenerative changes with ligamentum flavum redundancy on the right side. Postsurgical changes from left laminotomy with improved canal patency. There is persistent prominent narrowing of the bilateral subarticular zones and severe bilateral neural foraminal narrowing.   L5-S1: Shallow disc bulge, advanced facet degenerative changes and ligamentum flavum redundancy. There also 2 synovial cy cyst in the right fossa lateral aspect of the spinal canal causing indentation on the thecal sac and contributing for mild spinal canal stenosis. There is moderate narrowing of the bilateral subarticular zones and moderate bilateral neural foraminal narrowing.   IMPRESSION: 1. At L1-2, new left subarticular, superiorly migrating disc extrusion resulting in effacement of the left subarticular zone, likely impinging on the left  L1 and L2 nerve roots. 2. At L2-3, narrowing of the bilateral subarticular zones, right greater than left, and moderate bilateral neural foraminal narrowing, unchanged. 3. At L3-4, prominent narrowing of the bilateral subarticular zones and severe bilateral neural foraminal narrowing, unchanged. 4. At L4-5, prominent narrowing of the bilateral subarticular zones and severe bilateral neural foraminal narrowing, unchanged. 5. At L5-S1, moderate narrowing of the bilateral subarticular zones and moderate bilateral neural foraminal narrowing, unchanged.   Electronically Signed   By: Baldemar Lenis M.D.   On: 03/21/2022 09:51 Note: Reviewed        Assessment   Diagnosis Status  1. Lumbar radiculopathy   2. Lumbar foraminal stenosis   3. History of lumbar laminectomy for spinal cord decompression   4. Chronic knee pain after total replacement of right knee joint   5. Lumbar post-laminectomy syndrome   6. History of left hip replacement   7. Chronic pain syndrome   8. Mid back pain, chronic    Persistent Persistent Controlled   Updated Problems: No problems updated.  Plan of Care  I discussed  percutaneous spinal cord stimulator trial with the patient in detail. I explained to the patient that they will have an external power source and programmer which the patient will use for 7 days. There will be daily communication with the stimulator company and the patient. A possible need for a mid-trial clinic visit to give the patient the best chance of success.   Patient will need to have a thorough psychosocial behavioral evaluation. Our office will be happy to help facilitate this. Will place referral.  Some of patient's pain does seem to be mechanical in nature, with some component of neurogenic pain as well. We discussed the indications for spinal cord stimulation, specifically stating that it is typically better for neuropathic and appendicular pain, but that we have had  some success in the treatment of low back and hip pain.   I explained to pt how a spinal cord stimulation (SCS) is indicated for chronic, intractable pain, especially in cases of failed back surgery syndrome, radiculopathy, or pain syndromes that affect broader areas such as the back and legs.  SCS devices provide continuous electrical impulses to the spinal cord, modulating pain signals before  they reach the brain.  We discussed the potential benefits and risks of spinal cord stimulation. Benefits: can provide substantial relief from chronic pain, long-term therapy with programmable settings, often reduces the need for oral medications, including opioids, some patients experience reduced dependency on other pain interventions. Risks (including but not limited to): Surgical risks, including infection, lead migration/fracture, and hardware malfunction, long-term implantation requires ongoing maintenance and possible reoperation, variable effectiveness: some patients do not experience sufficient pain relief.  I was able to evaluate her thoracic interlaminar spaces under live fluoroscopy and they appear patent for percutaneous access.  He will need to obtain thoracic MRI to rule out thoracic canal stenosis in preparation of spinal cord stim trial.  I will also place an order for psych eval as below.  Refill of tramadol as below.  Patient has a history of chronic and persistent right knee pain that is worsening after having total knee arthroplasty as well as a revision.  I will obtain a MRI of her right knee for further workup.  Patient will follow-up in 4 weeks to discuss results and further discuss spinal cord stimulator trial before we consider scheduling.    Pharmacotherapy (Medications Ordered): Meds ordered this encounter  Medications   traMADol (ULTRAM-ER) 100 MG 24 hr tablet    Sig: Take 1 tablet (100 mg total) by mouth daily.    Dispense:  30 tablet    Refill:  2   Orders:  Orders Placed  This Encounter  Procedures   DG PAIN CLINIC C-ARM 1-60 MIN NO REPORT    Intraoperative interpretation by procedural physician at [facility].    Standing Status:   Standing    Number of Occurrences:   1    Order Specific Question:   Reason for exam:    Answer:   Assistance in needle guidance and placement for procedures requiring needle placement in or near specific anatomical locations not easily accessible without such assistance.   MR KNEE RIGHT WO CONTRAST    Standing Status:   Future    Standing Expiration Date:   12/13/2022    Scheduling Instructions:     Please make sure that the patient understands that this needs to be done as soon as possible. Never have the patient do the imaging "just before the next appointment". Inform patient that having the imaging done within the [facility] will expedite the availability of the results and will provide      imaging availability to the requesting physician. In addition inform the patient that the imaging order has an expiration date and will not be renewed if not done within the active period.    Order Specific Question:   What is the patient's sedation requirement?    Answer:   No Sedation    Order Specific Question:   Does the patient have a pacemaker or implanted devices?    Answer:   No    Order Specific Question:   Preferred imaging location?    Answer:   [facility] (table limit - 500 lbs)    Order Specific Question:   Call Results- Best Contact Number?    Answer:   [phone] [facility])    Order Specific Question:   Radiology Contrast Protocol - do NOT remove file path    Answer:   [email or web address]   MR THORACIC SPINE WO CONTRAST    Standing Status:   Future    Standing Expiration Date:   11/12/2023    Order  Specific Question:   What is the patient's sedation requirement?    Answer:   No Sedation    Order Specific Question:   Does the patient have a pacemaker or implanted  devices?    Answer:   No    Order Specific Question:   Preferred imaging location?    Answer:   [facility] (table limit - 550lbs)   Ambulatory referral to Psychology    Referral Priority:   Routine    Referral Type:   Psychiatric    Referral Reason:   Specialty Services Required    Requested Specialty:   Psychology    Number of Visits Requested:   1   Follow-up plan:   Return in about 3 months (around 02/12/2023) for MM, F2F.      Analgesic Pharmacotherapy:  Opioid Analgesics:100 mg tramadol ER, consider buprenorphine  Membrane stabilizer:  Failed gabapentin.  Lyrica  50 mg twice daily  Muscle relaxant: To be determined at a later time  NSAID: Medically contraindicated patient on Eliquis for A-fib  Other analgesic(s): To be determined at a later time         Recent Visits Date Type Provider Dept  09/04/22 Procedure visit Edward Jolly, MD [facility]  Showing recent visits within past 90 days and meeting all other requirements Today's Visits Date Type Provider Dept  11/12/22 Office Visit Edward Jolly, MD [facility]  Showing today's visits and meeting all other requirements Future Appointments Date Type Provider Dept  02/06/23 Appointment Edward Jolly, MD [facility]  Showing future appointments within next 90 days and meeting all other requirements  I discussed the assessment and treatment plan with the patient. The patient was provided an opportunity to ask questions and all were answered. The patient agreed with the plan and demonstrated an understanding of the instructions.  Patient advised to call back or seek an in-person evaluation if the symptoms or condition worsens.  Duration of encounter: .  Total time on encounter, as per AMA guidelines included both the face-to-face and non-face-to-face time personally spent by the physician and/or other qualified health care professional(s) on the day of the encounter (includes  time in activities that require the physician or other qualified health care professional and does not include time in activities normally performed by clinical staff). Physician's time may include the following activities when performed: Preparing to see the patient (e.g., pre-charting review of records, searching for previously ordered imaging, lab work, and nerve conduction tests) Review of prior analgesic pharmacotherapies. Reviewing PMP Interpreting ordered tests (e.g., lab work, imaging, nerve conduction tests) Performing post-procedure evaluations, including interpretation of diagnostic procedures Obtaining and/or reviewing separately obtained history Performing a medically appropriate examination and/or evaluation Counseling and educating the patient/family/caregiver Ordering medications, tests, or procedures Referring and communicating with other health care professionals (when not separately reported) Documenting clinical information in the electronic or other health record Independently interpreting results (not separately reported) and communicating results to the patient/ family/caregiver Care coordination (not separately reported)  Note by: Edward Jolly, MD Date: 11/12/2022; Time: 1:10 PM

## 2022-12-08 ENCOUNTER — Ambulatory Visit
Admission: RE | Admit: 2022-12-08 | Discharge: 2022-12-08 | Disposition: A | Discharge status: Home / Self Care | Payer: Medicare Other | Source: Ambulatory Visit | Attending: Student in an Organized Health Care Education/Training Program | Admitting: Student in an Organized Health Care Education/Training Program

## 2022-12-08 DIAGNOSIS — G8929 Other chronic pain: Secondary | ICD-10-CM

## 2022-12-08 DIAGNOSIS — G894 Chronic pain syndrome: Secondary | ICD-10-CM

## 2022-12-08 DIAGNOSIS — M961 Postlaminectomy syndrome, not elsewhere classified: Secondary | ICD-10-CM

## 2022-12-24 ENCOUNTER — Telehealth: Payer: Self-pay | Admitting: Student in an Organized Health Care Education/Training Program

## 2022-12-24 NOTE — Telephone Encounter (Signed)
Patient would like to come in  for MRI results and next step,.

## 2022-12-31 ENCOUNTER — Encounter: Payer: Self-pay | Admitting: Student in an Organized Health Care Education/Training Program

## 2022-12-31 ENCOUNTER — Ambulatory Visit
Payer: Medicare Other | Attending: Student in an Organized Health Care Education/Training Program | Admitting: Student in an Organized Health Care Education/Training Program

## 2022-12-31 VITALS — BP 137/86 | HR 64 | Temp 98.1°F | Resp 18 | Ht 61.0 in | Wt 160.0 lb

## 2022-12-31 DIAGNOSIS — M48061 Spinal stenosis, lumbar region without neurogenic claudication: Secondary | ICD-10-CM

## 2022-12-31 DIAGNOSIS — M961 Postlaminectomy syndrome, not elsewhere classified: Secondary | ICD-10-CM | POA: Diagnosis not present

## 2022-12-31 DIAGNOSIS — G894 Chronic pain syndrome: Secondary | ICD-10-CM | POA: Diagnosis present

## 2022-12-31 DIAGNOSIS — M5416 Radiculopathy, lumbar region: Secondary | ICD-10-CM

## 2022-12-31 DIAGNOSIS — Z9889 Other specified postprocedural states: Secondary | ICD-10-CM | POA: Diagnosis not present

## 2022-12-31 NOTE — Progress Notes (Signed)
Safety precautions to be maintained throughout the outpatient stay will include: orient to surroundings, keep bed in low position, maintain call bell within reach at all times, provide assistance with transfer out of bed and ambulation.  

## 2022-12-31 NOTE — Patient Instructions (Addendum)
Give patient soap solution for SCS trial  Procedure instructions  Do not eat or drink fluids (other than water) for 6 hours before your procedure  No water for 2 hours before your procedure  Take your blood pressure medicine with a sip of water  Arrive 30 minutes before your appointment  Carefully read the "Preparing for your procedure" detailed instructions  If you have questions call us at [phone]  _____________________________________________________________________    ______________________________________________________________________  Preparing for your procedure  Appointments: If you think you may not be able to keep your appointment, call 24-48 hours in advance to cancel. We need time to make it available to others.  During your procedure appointment there will be: No Prescription Refills. No disability issues to discussed. No medication changes or discussions.  Instructions: Food intake: Avoid eating anything solid for at least 8 hours prior to your procedure. Clear liquid intake: You may take clear liquids such as water up to 2 hours prior to your procedure. (No carbonated drinks. No soda.) Transportation: Unless otherwise stated by your physician, bring a driver. Morning Medicines: Except for blood thinners, take all of your other morning medications with a sip of water. Make sure to take your heart and blood pressure medicines. If your blood pressure's lower number is above 100, the case will be rescheduled. Blood thinners: Make sure to stop your blood thinners as instructed.  If you take a blood thinner, but were not instructed to stop it, call our office [phone] and ask to talk to a nurse. Not stopping a blood thinner prior to certain procedures could lead to serious complications. Diabetics on insulin: Notify the staff so that you can be scheduled 1st case in the morning. If your diabetes requires high dose insulin, take only  of your normal  insulin dose the morning of the procedure and notify the staff that you have done so. Preventing infections: Shower with an antibacterial soap the morning of your procedure.  Build-up your immune system: Take 1000 mg of Vitamin C with every meal (3 times a day) the day prior to your procedure. Antibiotics: Inform the nursing staff if you are taking any antibiotics or if you have any conditions that may require antibiotics prior to procedures. (Example: recent joint implants)   Pregnancy: If you are pregnant make sure to notify the nursing staff. Not doing so may result in injury to the fetus, including death.  Sickness: If you have a cold, fever, or any active infections, call and cancel or reschedule your procedure. Receiving steroids while having an infection may result in complications. Arrival: You must be in the facility at least 30 minutes prior to your scheduled procedure. Tardiness: Your scheduled time is also the cutoff time. If you do not arrive at least 15 minutes prior to your procedure, you will be rescheduled.  Children: Do not bring any children with you. Make arrangements to keep them home. Dress appropriately: There is always a possibility that your clothing may get soiled. Avoid long dresses. Valuables: Do not bring any jewelry or valuables.  Reasons to call and reschedule or cancel your procedure: (Following these recommendations will minimize the risk of a serious complication.) Surgeries: Avoid having procedures within 2 weeks of any surgery. (Avoid for 2 weeks before or after any surgery). Flu Shots: Avoid having procedures within 2 weeks of a flu shots or . (Avoid for 2 weeks before or after immunizations). Barium: Avoid having a procedure within 7-10 days after having had a  radiological study involving the use of radiological contrast. (Myelograms, Barium swallow or enema study). Heart attacks: Avoid any elective procedures or surgeries for the initial 6 months after a  "Myocardial Infarction" (Heart Attack). Blood thinners: It is imperative that you stop these medications before procedures. Let us know if you if you take any blood thinner.  Infection: Avoid procedures during or within two weeks of an infection (including chest colds or gastrointestinal problems). Symptoms associated with infections include: Localized redness, fever, chills, night sweats or profuse sweating, burning sensation when voiding, cough, congestion, stuffiness, runny nose, sore throat, diarrhea, nausea, vomiting, cold or Flu symptoms, recent or current infections. It is specially important if the infection is over the area that we intend to treat. Heart and lung problems: Symptoms that may suggest an active cardiopulmonary problem include: cough, chest pain, breathing difficulties or shortness of breath, dizziness, ankle swelling, uncontrolled high or unusually low blood pressure, and/or palpitations. If you are experiencing any of these symptoms, cancel your procedure and contact your primary care physician for an evaluation.  Remember:  Regular Business hours are:  Monday to Thursday 8:00 AM to 4:00 PM  Provider's Schedule: Delano Metz, MD:  Procedure days: Tuesday and Thursday 7:30 AM to 4:00 PM  Edward Jolly, MD:  Procedure days: Monday and Wednesday 7:30 AM to 4:00 PM

## 2022-12-31 NOTE — Progress Notes (Signed)
PROVIDER NOTE: Information contained herein reflects review and annotations entered in association with encounter. Interpretation of such information and data should be left to medically-trained personnel. Information provided to patient can be located elsewhere in the medical record under "Patient Instructions". Document created using STT-dictation technology, any transcriptional errors that may result from process are unintentional.    Patient: [name]  Service Category: E/M  Provider: Edward Jolly, MD  DOB: 1940-01-29  DOS: 12/31/2022  Referring Provider: Patrice Paradise, MD  MRN: [id number]  Specialty: Interventional Pain Management  PCP: Patrice Paradise, MD  Type: Established Patient  Setting: Ambulatory outpatient    Location: Office  Delivery: Face-to-face     HPI  [name], a 82 y.o. year old female, is here today because of her Lumbar radiculopathy [M54.16]. [name] primary complain today is Back Pain and Knee Pain (Right )  Pertinent problems: [name] has History of left hip replacement; S/P revision of total knee, right; Chronic pain of right knee; Chronic pain syndrome; and History of lumbar laminectomy for spinal cord decompression (L3-L5) Dr Marcell Barlow 2022 on their pertinent problem list. Pain Assessment: Severity of Chronic pain is reported as a 6 /10. Location: Knee Right/Can radiate above the knee to the thigh. Onset: More than a month ago. Quality: Aching. Timing: Constant. Modifying factor(s): Tramadol, tylenol 650mg , ice and heat. Vitals:  height is 5\' 1"  (1.549 m) and weight is 160 lb (72.6 kg). Her temporal temperature is 98.1 F (36.7 C). Her blood pressure is 137/86 and her pulse is 64. Her respiration is 18 and oxygen saturation is 99%.  BMI: Estimated body mass index is 30.23 kg/m as calculated from the following:   Height as of this encounter: 5\' 1"  (1.549 m).   Weight as of this encounter: 160 lb (72.6 kg). Last encounter:  11/12/2022. Last procedure: 09/04/2022.  Reason for encounter:   History of Present Illness   The patient presents for a follow-up consultation to discuss recent MRI results. She has been experiencing intermittent pain radiating down into her right hip and leg, which is exacerbated by standing and relieved by sitting. The patient reports no pain when at rest. She has previously undergone spine surgery and has tried injections for pain relief, which provided temporary benefit.  The patient also reports occasional burning sensations in her knee, which also resolve with rest. Despite these symptoms, the patient expresses a desire to maintain an active lifestyle and is open to exploring new treatment options.  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 The recent MRI revealed a disc herniation at L2, L3 in the lumbar spine, impacting the right nerve root, which could be the source of the radiating pain into the right hip and leg.   She has a hx of prior lumbar laminotomy        ROS  Constitutional: Denies any fever or chills Gastrointestinal: No reported hemesis, hematochezia, vomiting, or acute GI distress Musculoskeletal:  as above Neurological:  as above  Medication Review  Vitamin D, acetaminophen, amLODipine, apixaban, carboxymethylcellulose, dronedarone, escitalopram, estradiol, omeprazole, pravastatin, pregabalin, and traMADol  History Review  Allergy: [name] is allergic to aspirin, nsaids, methocarbamol, morphine and codeine, and oxycodone. Drug: [name]  reports no history of drug use. Alcohol:  reports no history of alcohol use. Tobacco:  reports that she has never smoked. She has never used smokeless tobacco. Social: [name]  reports that she has never smoked. She has never used smokeless tobacco. She reports that she does not drink alcohol and does not use drugs. Medical:  has a past medical  history of Anxiety, Aortic atherosclerosis (HCC), Arthritis, Current use of long term anticoagulation, Dysrhythmia, GERD (gastroesophageal reflux disease), History of 2019 novel coronavirus disease (COVID-19) (07/2019), History of kidney stones, Hypercholesteremia, Hypertension, IDA (iron deficiency anemia), Paroxysmal atrial fibrillation (HCC), and Skin cancer. Surgical: [name]  has a past surgical history that includes Abdominal hysterectomy; Septoplasty; Cataract extraction w/PHACO (Right, 07/13/2014); Cataract extraction w/PHACO (Left, 10/05/2014); Breast excisional biopsy (Left, 1973); Lumbar laminectomy/decompression microdiscectomy (N/A, 05/10/2020); Breast cyst aspiration (Left, 11/21/2015); Joint replacement; Total hip arthroplasty (Left, 06/16/2020); Colonoscopy; and Total knee revision (Right, 02/15/2021). Family: family history includes CAD in her father; Dementia in her mother; Heart attack in her father.  Laboratory Chemistry Profile   Renal Lab Results  Component Value Date   BUN 19 02/20/2021   CREATININE 1.00 02/20/2021   GFRAA 38 (L) 06/19/2018   GFRNONAA 56 (L) 02/20/2021    Hepatic Lab Results  Component Value Date   AST 13 (L) 02/20/2021   ALT 11 02/20/2021   ALBUMIN 2.9 (L) 02/20/2021   ALKPHOS 58 02/20/2021   AMMONIA <10 02/20/2021    Electrolytes Lab Results  Component Value Date   NA 137 02/20/2021   K 3.4 (L) 02/20/2021   CL 104 02/20/2021   CALCIUM 9.0 02/20/2021    Bone No results found for: "VD25OH", "VD125OH2TOT", "UY4034VQ2", "VZ5638VF6", "25OHVITD1", "25OHVITD2", "25OHVITD3", "TESTOFREE", "TESTOSTERONE"  Inflammation (CRP: Acute Phase) (ESR: Chronic Phase) No results found for: "CRP", "ESRSEDRATE", "LATICACIDVEN"       Note: Above Lab results reviewed.  Recent Imaging Review  MR THORACIC SPINE WO CONTRAST CLINICAL DATA:  Mid low back pain for 3.5 years.  No known injury.  EXAM: MRI THORACIC SPINE WITHOUT CONTRAST  TECHNIQUE: Multiplanar,  multisequence MR imaging of the thoracic spine was performed. No intravenous contrast was administered.  COMPARISON:  None Available.  FINDINGS: Alignment: 2-3 mm anterolisthesis of C3 on C4. Dextrocurvature of the thoracic spine.  Vertebrae: No acute fracture, evidence of discitis, or aggressive bone lesion.  Cord:  Normal signal and morphology.  Paraspinal and other soft tissues: No acute paraspinal abnormality. Patulous esophagus.  Disc levels:  Disc spaces: Mild disc height loss at T12-L1 and L1-2 with Modic 1 endplate changes. Severe disc height loss at L2-3 partially visualized. Partially visualized degenerative disease with disc height loss at C3-4, C4-5, C5-6 and C6-7.  T1-T2: No disc protrusion, foraminal stenosis or central canal stenosis.  T2-T3: No disc protrusion, foraminal stenosis or central canal stenosis.  T3-T4: No disc protrusion, foraminal stenosis or central canal stenosis.  T4-T5: No disc protrusion, foraminal stenosis or central canal stenosis.  T5-T6: No disc protrusion, foraminal stenosis or central canal stenosis.  T6-T7: No disc protrusion, foraminal stenosis or central canal stenosis.  T7-T8: No disc protrusion, foraminal stenosis or central canal stenosis.  T8-T9: No disc protrusion, foraminal stenosis or central canal stenosis.  T9-T10: No disc protrusion, foraminal stenosis or central canal stenosis.  T10-T11: No disc protrusion, foraminal stenosis or central canal stenosis.  T11-T12:No disc protrusion. Mild bilateral foraminal stenosis. No central canal stenosis.  T12-L1: Mild disc bulge. No central canal stenosis. Moderate bilateral facet arthropathy. No significant foraminal stenosis.  L1-2: Mild disc osteophyte complex. No foraminal or central canal stenosis.  L2-3: Partially visualized is a broad disc osteophyte complex. Moderate right foraminal stenosis. No left foraminal  stenosis. No significant spinal stenosis.  The degree of  IMPRESSION: 1. No acute osseous injury of the thoracic spine. 2. At T11-12 there is a mild disc bulge. No central canal stenosis. Moderate bilateral facet arthropathy. 3. At T12-L1 there is a mild disc bulge. No central canal stenosis. Moderate bilateral facet arthropathy. 4. At L2-3 there is a partially visualized broad disc osteophyte complex. Moderate right foraminal stenosis. 5. Partially visualized degenerative disease with disc height loss at C3-4, C4-5, C5-6 and C6-7.  Electronically Signed   By: Elige Ko M.D.   On: 12/27/2022 07:43 MR KNEE RIGHT WO CONTRAST CLINICAL DATA:  Interim placement, no known injury, lateral knee pain for 2 years  EXAM: MRI OF THE RIGHT KNEE WITHOUT CONTRAST  TECHNIQUE: Multiplanar, multisequence MR imaging of the knee was performed. No intravenous contrast was administered.  COMPARISON:  None Available.  FINDINGS: Bones/Joint/Cartilage  Right total knee arthroplasty with susceptibility artifact partially obscuring the adjacent soft tissue and osseous structures. Patient motion also degrades image quality. No periarticular fluid collection or osteolysis. No fracture or dislocation. Normal alignment. Small amount of joint fluid. No marrow signal abnormality.  Ligaments  Collateral ligaments are intact.  Muscles and Tendons  Quadriceps tendon and patellar tendon are not well delineated secondary to artifact and motion, but are grossly intact. Muscles are normal.  Soft tissue No fluid collection or hematoma.  No soft tissue mass.  IMPRESSION: Right total knee arthroplasty with susceptibility artifact partially obscuring the adjacent soft tissue and osseous structures. Patient motion also degrades image quality. No complicating features.  Electronically Signed   By: Elige Ko M.D.   On: 12/27/2022 07:19   CLINICAL DATA:  Lumbar radiculopathy M54.16 (ICD-10-CM). History of lumbar laminectomy for spinal  cord decompression Z98.890 (ICD-10-CM). Lumbar post-laminectomy syndrome M96.1 (ICD-10-CM). Chronic pain syndrome G89.4 (ICD-10-CM).   EXAM: MRI LUMBAR SPINE WITHOUT CONTRAST   TECHNIQUE: Multiplanar, multisequence MR imaging of the lumbar spine was performed. No intravenous contrast was administered.   COMPARISON:  MRI of the lumbar spine January 09, 2019.   FINDINGS: Segmentation:  Standard.   Alignment: A 3-4 mm anterolisthesis of L4 and trace anterolisthesis of T12 over L1. Small retrolisthesis at L1-2, L2-3 and L3-4. Levoconvex curvature.   Vertebrae: No fracture, evidence of discitis, or bone lesion. Postsurgical changes from left laminotomy at L3-4 and L4-5. Degenerative endplate changes at L2-3 and L3-4 with reactive marrow edema at L3-4. L1 inferior endplate degenerative changes with associated Schmorl node and reactive marrow edema.   Conus medullaris and cauda equina: Conus extends to the L2 level. Conus and cauda equina appear normal.   Paraspinal and other soft tissues: Negative.   Disc levels:   T12-L1: Mild anterolisthesis, shallow disc bulge, moderate right and mild left facet degenerative changes without significant spinal canal or neural foraminal stenosis.   L1-2: Loss of disc height, disc bulge with superimposed left subarticular, superiorly migrating disc extrusion and mild facet degenerative changes. Findings result in effacement of the left subarticular zone, likely impinging on the left L1 and L2 nerve roots and mild left neural foraminal narrowing. Findings have developed since prior MRI.   L2-3: Loss of disc height, disc bulge with associated osteophytic component and moderate facet degenerative changes resulting in narrowing of the bilateral subarticular zones, right greater than left, and moderate bilateral neural foraminal narrowing, unchanged.   L3-4: Retrolisthesis, loss of disc height, disc bulge, moderate facet degenerative changes  and ligamentum flavum redundancy. Posttreatment changes from left laminotomy with improved  spinal canal patency. Persistent prominent subarticular zone stenosis and severe bilateral neural foraminal narrowing.   L4-5: Anterolisthesis, disc bulge left disc uncovering and advanced hypertrophic facet degenerative changes with ligamentum flavum redundancy on the right side. Postsurgical changes from left laminotomy with improved canal patency. There is persistent prominent narrowing of the bilateral subarticular zones and severe bilateral neural foraminal narrowing.   L5-S1: Shallow disc bulge, advanced facet degenerative changes and ligamentum flavum redundancy. There also 2 synovial cy cyst in the right fossa lateral aspect of the spinal canal causing indentation on the thecal sac and contributing for mild spinal canal stenosis. There is moderate narrowing of the bilateral subarticular zones and moderate bilateral neural foraminal narrowing.   IMPRESSION: 1. At L1-2, new left subarticular, superiorly migrating disc extrusion resulting in effacement of the left subarticular zone, likely impinging on the left L1 and L2 nerve roots. 2. At L2-3, narrowing of the bilateral subarticular zones, right greater than left, and moderate bilateral neural foraminal narrowing, unchanged. 3. At L3-4, prominent narrowing of the bilateral subarticular zones and severe bilateral neural foraminal narrowing, unchanged. 4. At L4-5, prominent narrowing of the bilateral subarticular zones and severe bilateral neural foraminal narrowing, unchanged. 5. At L5-S1, moderate narrowing of the bilateral subarticular zones and moderate bilateral neural foraminal narrowing, unchanged.     Electronically Signed   By: Baldemar Lenis M.D.   On: 03/21/2022 09:51      Note: Reviewed        Physical Exam  General appearance: Well nourished, well developed, and well hydrated. In no apparent acute  distress Mental status: Alert, oriented x 3 (person, place, & time)       Respiratory: No evidence of acute respiratory distress Eyes: PERLA Vitals: BP 137/86   Pulse 64   Temp 98.1 F (36.7 C) (Temporal)   Resp 18   Ht 5\' 1"  (1.549 m)   Wt 160 lb (72.6 kg)   SpO2 99%   BMI 30.23 kg/m  BMI: Estimated body mass index is 30.23 kg/m as calculated from the following:   Height as of this encounter: 5\' 1"  (1.549 m).   Weight as of this encounter: 160 lb (72.6 kg). Ideal: Ideal body weight: 47.8 kg (105 lb 6.1 oz) Adjusted ideal body weight: 57.7 kg (127 lb 3.7 oz)  Lumbar Spine Area Exam  Skin & Axial Inspection: Well healed scar from previous spine surgery detected Alignment: Symmetrical Functional ROM: Pain restricted ROM       Stability: No instability detected Muscle Tone/Strength: Functionally intact. No obvious neuro-muscular anomalies detected. Sensory (Neurological): Dermatomal pain pattern Palpation: No palpable anomalies       Provocative Tests: Hyperextension/rotation test: deferred today       Lumbar quadrant test (Kemp's test): (+) bilateral for foraminal stenosis Lateral bending test: (+) ipsilateral radicular pain, bilaterally. Positive for bilateral foraminal stenosis.   Gait & Posture Assessment  Ambulation: Unassisted Gait: Antalgic gait (limping) Posture: WNL    Lower Extremity Exam      Side: Right lower extremity   Side: Left lower extremity  Stability: No instability observed           Stability: No instability observed          Skin & Extremity Inspection: Skin color, temperature, and hair growth are WNL. No peripheral edema or cyanosis. No masses, redness, swelling, asymmetry, or associated skin lesions. No contractures.   Skin & Extremity Inspection: Skin color, temperature, and hair growth are WNL. No peripheral edema  or cyanosis. No masses, redness, swelling, asymmetry, or associated skin lesions. No contractures.  Functional ROM: Pain restricted ROM  for hip and knee joints           Functional ROM: Unrestricted ROM                  Muscle Tone/Strength: Functionally intact. No obvious neuro-muscular anomalies detected.   Muscle Tone/Strength: Functionally intact. No obvious neuro-muscular anomalies detected.  Sensory (Neurological): Arthropathic arthralgia         Sensory (Neurological): Unimpaired        DTR: Patellar: deferred today Achilles: deferred today Plantar: deferred today   DTR: Patellar: deferred today Achilles: deferred today Plantar: deferred today  Palpation: No palpable anomalies   Palpation: No palpable anomalies    Assessment   Diagnosis Status  1. Lumbar radiculopathy   2. Lumbar foraminal stenosis   3. History of lumbar laminectomy for spinal cord decompression   4. Lumbar post-laminectomy syndrome   5. Chronic pain syndrome    Having a Flare-up Having a Flare-up Controlled   Updated Problems: No problems updated.  Plan of Care  Problem-specific:  Assessment and Plan    Lumbar Disc Herniation with Radiculopathy   She has a disc herniation at L2-L3 impacting the right nerve root, leading to intermittent pain radiating to the right hip and leg, worsened by standing and alleviated by sitting. Given her history of lumbar spine surgery and nerve irritation, along with temporary relief from an injection in August, we discussed a spinal cord stimulator trial as the next step.  We also covered the risks, such as infection, the need for antibiotics, and the prohibition of showers for seven days post-procedure. If the trial proves successful, she will undergo minimally invasive surgery for permanent implantation by a neurosurgeon. She expressed a positive outlook and readiness for the trial. I explained to pt how a spinal cord stimulation (SCS) is indicated for chronic, intractable pain, especially in cases of failed back surgery syndrome, radiculopathy, or pain syndromes that affect broader areas such as the back  and legs.  SCS devices provide continuous electrical impulses to the spinal cord, modulating pain signals before they reach the brain.  We discussed the potential benefits and risks of spinal cord stimulation. Benefits: can provide substantial relief from chronic pain, long-term therapy with programmable settings, often reduces the need for oral medications, including opioids, some patients experience reduced dependency on other pain interventions. Risks (including but not limited to): Surgical risks, including infection, lead migration/fracture, and hardware malfunction, long-term implantation requires ongoing maintenance and possible reoperation, variable effectiveness: some patients do not experience sufficient pain relief.  Thoracic and Lumbar Spine Arthritis   She has arthritis at the T12-L1 junction, which may contribute to her upper lower back pain, though no significant compression from disc bulges in the thoracic spine is noted. We will monitor symptoms and manage conservatively.         Pharmacotherapy (Medications Ordered): No orders of the defined types were placed in this encounter.  Orders:  Orders Placed This Encounter  Procedures   [facility] TRIAL    Contact medical implant company representative to notify them of the scheduled case and to make sure they will be available to provide required equipment.    Standing Status:   Future    Standing Expiration Date:   03/31/2023    Scheduling Instructions:     Side: Bilateral     Level: Lumbar     Device: [employer]  Sedation: With sedation     Timeframe: As soon as pre-approved    Order Specific Question:   Where will this procedure be performed?    Answer:   [facility]   Follow-up plan:   Return in about 5 weeks (around 02/05/2023) for [employer] trial, ECT.      Analgesic Pharmacotherapy:  Opioid Analgesics:100 mg tramadol ER, consider buprenorphine  Membrane stabilizer:  Failed gabapentin.  Lyrica   50 mg twice daily  Muscle relaxant: To be determined at a later time  NSAID: Medically contraindicated patient on Eliquis for A-fib  Other analgesic(s): To be determined at a later time          Recent Visits Date Type Provider Dept  11/12/22 Office Visit Edward Jolly, MD [facility]  Showing recent visits within past 90 days and meeting all other requirements Today's Visits Date Type Provider Dept  12/31/22 Office Visit Edward Jolly, MD [facility]  Showing today's visits and meeting all other requirements Future Appointments Date Type Provider Dept  02/06/23 Appointment Edward Jolly, MD [facility]  Showing future appointments within next 90 days and meeting all other requirements  I discussed the assessment and treatment plan with the patient. The patient was provided an opportunity to ask questions and all were answered. The patient agreed with the plan and demonstrated an understanding of the instructions.  Patient advised to call back or seek an in-person evaluation if the symptoms or condition worsens.  Duration of encounter: 30 minutes.  Total time on encounter, as per AMA guidelines included both the face-to-face and non-face-to-face time personally spent by the physician and/or other qualified health care professional(s) on the day of the encounter (includes time in activities that require the physician or other qualified health care professional and does not include time in activities normally performed by clinical staff). Physician's time may include the following activities when performed: Preparing to see the patient (e.g., pre-charting review of records, searching for previously ordered imaging, lab work, and nerve conduction tests) Review of prior analgesic pharmacotherapies. Reviewing PMP Interpreting ordered tests (e.g., lab work, imaging, nerve conduction tests) Performing post-procedure evaluations, including interpretation of  diagnostic procedures Obtaining and/or reviewing separately obtained history Performing a medically appropriate examination and/or evaluation Counseling and educating the patient/family/caregiver Ordering medications, tests, or procedures Referring and communicating with other health care professionals (when not separately reported) Documenting clinical information in the electronic or other health record Independently interpreting results (not separately reported) and communicating results to the patient/ family/caregiver Care coordination (not separately reported)  Note by: Edward Jolly, MD Date: 12/31/2022; Time: 10:33 AM

## 2023-02-06 ENCOUNTER — Encounter: Payer: Medicare Other | Admitting: Student in an Organized Health Care Education/Training Program

## 2023-02-24 ENCOUNTER — Telehealth: Payer: Self-pay | Admitting: *Deleted

## 2023-02-24 NOTE — Telephone Encounter (Signed)
I have attempted to call 02/13/23. Unable to leave a message on cell due to mailbox full. Left message on home phone with no response back.  02/19/23 attempted to call. No answer. Unable to leave message due to mailbox full.  02/24/23 Attempted to call cell. Unable to leave message due to mailbox full. Called home number no answer.

## 2023-02-25 ENCOUNTER — Telehealth: Payer: Self-pay

## 2023-02-25 NOTE — Telephone Encounter (Signed)
 I was able to speak with the patient regarding scheduling her SCS trial. She is in the hospital, and has been for a couple of weeks. They determined that she has kidney stones. They put in a stent and when she leaves the hospital she will go to a rehab nursing home. She states that her pain is gone and she is hoping that the kidney stones are what was causing her problem the whole time. She will have to put off the SCS trial due to everything that is going on. She said she will call us  if she needs to pursue the trial. She said thank you for all your help.

## 2023-06-10 ENCOUNTER — Emergency Department

## 2023-06-10 ENCOUNTER — Inpatient Hospital Stay
Admission: EM | Admit: 2023-06-10 | Discharge: 2023-06-13 | DRG: 871 | Disposition: A | Discharge status: Skilled Nursing Facility | Source: Skilled Nursing Facility | Attending: Internal Medicine | Admitting: Internal Medicine

## 2023-06-10 ENCOUNTER — Encounter: Payer: Self-pay | Admitting: Pharmacy Technician

## 2023-06-10 ENCOUNTER — Other Ambulatory Visit: Payer: Self-pay

## 2023-06-10 DIAGNOSIS — R6520 Severe sepsis without septic shock: Secondary | ICD-10-CM | POA: Diagnosis present

## 2023-06-10 DIAGNOSIS — J1282 Pneumonia due to coronavirus disease 2019: Secondary | ICD-10-CM | POA: Diagnosis present

## 2023-06-10 DIAGNOSIS — Z8616 Personal history of COVID-19: Secondary | ICD-10-CM

## 2023-06-10 DIAGNOSIS — M1990 Unspecified osteoarthritis, unspecified site: Secondary | ICD-10-CM | POA: Diagnosis present

## 2023-06-10 DIAGNOSIS — E7800 Pure hypercholesterolemia, unspecified: Secondary | ICD-10-CM | POA: Diagnosis present

## 2023-06-10 DIAGNOSIS — A4189 Other specified sepsis: Principal | ICD-10-CM | POA: Diagnosis present

## 2023-06-10 DIAGNOSIS — Z7901 Long term (current) use of anticoagulants: Secondary | ICD-10-CM

## 2023-06-10 DIAGNOSIS — R5381 Other malaise: Secondary | ICD-10-CM | POA: Diagnosis not present

## 2023-06-10 DIAGNOSIS — E872 Acidosis, unspecified: Secondary | ICD-10-CM | POA: Diagnosis present

## 2023-06-10 DIAGNOSIS — R509 Fever, unspecified: Secondary | ICD-10-CM

## 2023-06-10 DIAGNOSIS — R9431 Abnormal electrocardiogram [ECG] [EKG]: Secondary | ICD-10-CM | POA: Diagnosis not present

## 2023-06-10 DIAGNOSIS — Z8249 Family history of ischemic heart disease and other diseases of the circulatory system: Secondary | ICD-10-CM

## 2023-06-10 DIAGNOSIS — J440 Chronic obstructive pulmonary disease with acute lower respiratory infection: Secondary | ICD-10-CM | POA: Diagnosis present

## 2023-06-10 DIAGNOSIS — A419 Sepsis, unspecified organism: Secondary | ICD-10-CM | POA: Diagnosis not present

## 2023-06-10 DIAGNOSIS — F03A0 Unspecified dementia, mild, without behavioral disturbance, psychotic disturbance, mood disturbance, and anxiety: Secondary | ICD-10-CM | POA: Diagnosis present

## 2023-06-10 DIAGNOSIS — J690 Pneumonitis due to inhalation of food and vomit: Secondary | ICD-10-CM | POA: Diagnosis present

## 2023-06-10 DIAGNOSIS — Z79899 Other long term (current) drug therapy: Secondary | ICD-10-CM

## 2023-06-10 DIAGNOSIS — G9341 Metabolic encephalopathy: Secondary | ICD-10-CM | POA: Diagnosis present

## 2023-06-10 DIAGNOSIS — R7989 Other specified abnormal findings of blood chemistry: Secondary | ICD-10-CM | POA: Diagnosis not present

## 2023-06-10 DIAGNOSIS — Z96651 Presence of right artificial knee joint: Secondary | ICD-10-CM | POA: Diagnosis present

## 2023-06-10 DIAGNOSIS — Z90710 Acquired absence of both cervix and uterus: Secondary | ICD-10-CM

## 2023-06-10 DIAGNOSIS — A0472 Enterocolitis due to Clostridium difficile, not specified as recurrent: Secondary | ICD-10-CM

## 2023-06-10 DIAGNOSIS — N179 Acute kidney failure, unspecified: Secondary | ICD-10-CM

## 2023-06-10 DIAGNOSIS — I700 Atherosclerosis of aorta: Secondary | ICD-10-CM | POA: Diagnosis present

## 2023-06-10 DIAGNOSIS — J9601 Acute respiratory failure with hypoxia: Secondary | ICD-10-CM

## 2023-06-10 DIAGNOSIS — I10 Essential (primary) hypertension: Secondary | ICD-10-CM

## 2023-06-10 DIAGNOSIS — Z85828 Personal history of other malignant neoplasm of skin: Secondary | ICD-10-CM

## 2023-06-10 DIAGNOSIS — J189 Pneumonia, unspecified organism: Secondary | ICD-10-CM | POA: Diagnosis not present

## 2023-06-10 DIAGNOSIS — I480 Paroxysmal atrial fibrillation: Secondary | ICD-10-CM

## 2023-06-10 DIAGNOSIS — Z87442 Personal history of urinary calculi: Secondary | ICD-10-CM

## 2023-06-10 DIAGNOSIS — Z66 Do not resuscitate: Secondary | ICD-10-CM | POA: Diagnosis present

## 2023-06-10 DIAGNOSIS — Z96642 Presence of left artificial hip joint: Secondary | ICD-10-CM | POA: Diagnosis present

## 2023-06-10 DIAGNOSIS — R4182 Altered mental status, unspecified: Secondary | ICD-10-CM | POA: Diagnosis present

## 2023-06-10 DIAGNOSIS — U071 COVID-19: Secondary | ICD-10-CM

## 2023-06-10 DIAGNOSIS — K219 Gastro-esophageal reflux disease without esophagitis: Secondary | ICD-10-CM | POA: Diagnosis present

## 2023-06-10 LAB — COMPREHENSIVE METABOLIC PANEL WITH GFR
ALT: 16 U/L (ref 0–44)
AST: 19 U/L (ref 15–41)
Albumin: 2.9 g/dL — ABNORMAL LOW (ref 3.5–5.0)
Alkaline Phosphatase: 37 U/L — ABNORMAL LOW (ref 38–126)
Anion gap: 8 (ref 5–15)
BUN: 34 mg/dL — ABNORMAL HIGH (ref 8–23)
CO2: 22 mmol/L (ref 22–32)
Calcium: 8 mg/dL — ABNORMAL LOW (ref 8.9–10.3)
Chloride: 104 mmol/L (ref 98–111)
Creatinine, Ser: 1.9 mg/dL — ABNORMAL HIGH (ref 0.44–1.00)
GFR, Estimated: 26 mL/min — ABNORMAL LOW (ref >60)
Glucose, Bld: 94 mg/dL (ref 70–99)
Potassium: 4 mmol/L (ref 3.5–5.1)
Sodium: 134 mmol/L — ABNORMAL LOW (ref 135–145)
Total Bilirubin: 0.5 mg/dL (ref 0.0–1.2)
Total Protein: 6.2 g/dL — ABNORMAL LOW (ref 6.5–8.1)

## 2023-06-10 LAB — CBC WITH DIFFERENTIAL/PLATELET
Abs Immature Granulocytes: 0.01 10*3/uL (ref 0.00–0.07)
Basophils Absolute: 0 10*3/uL (ref 0.0–0.1)
Basophils Relative: 0 %
Eosinophils Absolute: 0 10*3/uL (ref 0.0–0.5)
Eosinophils Relative: 0 %
HCT: 32.4 % — ABNORMAL LOW (ref 36.0–46.0)
Hemoglobin: 10.6 g/dL — ABNORMAL LOW (ref 12.0–15.0)
Immature Granulocytes: 0 %
Lymphocytes Relative: 36 %
Lymphs Abs: 1.7 10*3/uL (ref 0.7–4.0)
MCH: 29.8 pg (ref 26.0–34.0)
MCHC: 32.7 g/dL (ref 30.0–36.0)
MCV: 91 fL (ref 80.0–100.0)
Monocytes Absolute: 0.5 10*3/uL (ref 0.1–1.0)
Monocytes Relative: 10 %
Neutro Abs: 2.4 10*3/uL (ref 1.7–7.7)
Neutrophils Relative %: 54 %
Platelets: 154 10*3/uL (ref 150–400)
RBC: 3.56 MIL/uL — ABNORMAL LOW (ref 3.87–5.11)
RDW: 15.1 % (ref 11.5–15.5)
WBC: 4.6 10*3/uL (ref 4.0–10.5)
nRBC: 0 % (ref 0.0–0.2)

## 2023-06-10 LAB — URINALYSIS, W/ REFLEX TO CULTURE (INFECTION SUSPECTED)
Bilirubin Urine: NEGATIVE
Glucose, UA: NEGATIVE mg/dL
Ketones, ur: NEGATIVE mg/dL
Leukocytes,Ua: NEGATIVE
Nitrite: NEGATIVE
Protein, ur: 30 mg/dL — AB
Specific Gravity, Urine: 1.015 (ref 1.005–1.030)
pH: 5 (ref 5.0–8.0)

## 2023-06-10 LAB — PROTIME-INR
INR: 1.7 — ABNORMAL HIGH (ref 0.8–1.2)
Prothrombin Time: 19.9 s — ABNORMAL HIGH (ref 11.4–15.2)

## 2023-06-10 LAB — LACTIC ACID, PLASMA: Lactic Acid, Venous: 0.7 mmol/L (ref 0.5–1.9)

## 2023-06-10 LAB — BRAIN NATRIURETIC PEPTIDE: B Natriuretic Peptide: 880.4 pg/mL — ABNORMAL HIGH (ref 0.0–100.0)

## 2023-06-10 LAB — RESP PANEL BY RT-PCR (RSV, FLU A&B, COVID)  RVPGX2
Influenza A by PCR: NEGATIVE
Influenza B by PCR: NEGATIVE
Resp Syncytial Virus by PCR: NEGATIVE
SARS Coronavirus 2 by RT PCR: POSITIVE — AB

## 2023-06-10 LAB — LIPASE, BLOOD: Lipase: 26 U/L (ref 11–51)

## 2023-06-10 MED ORDER — VANCOMYCIN HCL 125 MG PO CAPS
125.0000 mg | ORAL_CAPSULE | Freq: Four times a day (QID) | ORAL | Status: DC
  Administered 2023-06-10 – 2023-06-13 (×10): 125 mg via ORAL
  Filled 2023-06-10 (×15): qty 1

## 2023-06-10 MED ORDER — DRONEDARONE HCL 400 MG PO TABS
400.0000 mg | ORAL_TABLET | Freq: Two times a day (BID) | ORAL | Status: DC
  Administered 2023-06-11 – 2023-06-13 (×5): 400 mg via ORAL
  Filled 2023-06-10 (×8): qty 1

## 2023-06-10 MED ORDER — TRAMADOL HCL ER 100 MG PO TB24: 100.0000 mg | ORAL_TABLET | Freq: Every day | ORAL | Status: DC

## 2023-06-10 MED ORDER — SODIUM CHLORIDE 0.9 % IV SOLN: 100.0000 mg | Freq: Every day | INTRAVENOUS | Status: DC

## 2023-06-10 MED ORDER — APIXABAN 2.5 MG PO TABS: 2.5000 mg | ORAL_TABLET | Freq: Two times a day (BID) | ORAL | Status: DC

## 2023-06-10 MED ORDER — AZELASTINE HCL 0.1 % NA SOLN
2.0000 | Freq: Two times a day (BID) | NASAL | Status: DC
  Administered 2023-06-11 – 2023-06-13 (×5): 2 via NASAL
  Filled 2023-06-10 (×2): qty 30

## 2023-06-10 MED ORDER — PANTOPRAZOLE SODIUM 40 MG PO TBEC
40.0000 mg | DELAYED_RELEASE_TABLET | Freq: Every day | ORAL | Status: DC
  Administered 2023-06-10 – 2023-06-13 (×4): 40 mg via ORAL
  Filled 2023-06-10 (×4): qty 1

## 2023-06-10 MED ORDER — LOSARTAN POTASSIUM 50 MG PO TABS: 25.0000 mg | ORAL_TABLET | Freq: Every day | ORAL | Status: DC

## 2023-06-10 MED ORDER — RISAQUAD PO CAPS
1.0000 | ORAL_CAPSULE | Freq: Three times a day (TID) | ORAL | Status: DC
  Administered 2023-06-10 – 2023-06-13 (×8): 1 via ORAL
  Filled 2023-06-10 (×9): qty 1

## 2023-06-10 MED ORDER — FAMOTIDINE 20 MG PO TABS
20.0000 mg | ORAL_TABLET | Freq: Every day | ORAL | Status: DC
  Administered 2023-06-10 – 2023-06-13 (×4): 20 mg via ORAL
  Filled 2023-06-10 (×4): qty 1

## 2023-06-10 MED ORDER — SODIUM CHLORIDE 0.9 % IV SOLN
100.0000 mg | Freq: Every day | INTRAVENOUS | Status: AC
  Administered 2023-06-11 – 2023-06-12 (×2): 100 mg via INTRAVENOUS
  Filled 2023-06-10 (×3): qty 20

## 2023-06-10 MED ORDER — SODIUM CHLORIDE 0.9 % IV BOLUS
1000.0000 mL | Freq: Once | INTRAVENOUS | Status: AC
  Administered 2023-06-10: 1000 mL via INTRAVENOUS

## 2023-06-10 MED ORDER — ONDANSETRON HCL 4 MG PO TABS: 4.0000 mg | ORAL_TABLET | Freq: Four times a day (QID) | ORAL | Status: DC | PRN

## 2023-06-10 MED ORDER — TRAMADOL HCL 50 MG PO TABS
50.0000 mg | ORAL_TABLET | Freq: Two times a day (BID) | ORAL | Status: DC
  Administered 2023-06-10 – 2023-06-13 (×7): 50 mg via ORAL
  Filled 2023-06-10 (×7): qty 1

## 2023-06-10 MED ORDER — ACETAMINOPHEN 325 MG PO TABS
650.0000 mg | ORAL_TABLET | Freq: Four times a day (QID) | ORAL | Status: DC | PRN
  Administered 2023-06-12: 650 mg via ORAL
  Filled 2023-06-10: qty 2

## 2023-06-10 MED ORDER — ONDANSETRON HCL 4 MG/2ML IJ SOLN
4.0000 mg | Freq: Four times a day (QID) | INTRAMUSCULAR | Status: DC | PRN
Start: 2023-06-10 — End: 2023-06-13

## 2023-06-10 MED ORDER — DEXAMETHASONE 6 MG PO TABS
6.0000 mg | ORAL_TABLET | Freq: Once | ORAL | Status: AC
  Administered 2023-06-10: 6 mg via ORAL
  Filled 2023-06-10: qty 1

## 2023-06-10 MED ORDER — ACETAMINOPHEN 500 MG PO TABS
1000.0000 mg | ORAL_TABLET | Freq: Once | ORAL | Status: AC
  Administered 2023-06-10: 1000 mg via ORAL
  Filled 2023-06-10: qty 2

## 2023-06-10 MED ORDER — AMPICILLIN-SULBACTAM SODIUM 3 (2-1) G IJ SOLR
3.0000 g | Freq: Once | INTRAMUSCULAR | Status: AC
  Administered 2023-06-10: 3 g via INTRAVENOUS
  Filled 2023-06-10: qty 8

## 2023-06-10 MED ORDER — HYDRALAZINE HCL 20 MG/ML IJ SOLN
5.0000 mg | Freq: Four times a day (QID) | INTRAMUSCULAR | Status: DC | PRN
  Administered 2023-06-11 – 2023-06-13 (×5): 5 mg via INTRAVENOUS
  Filled 2023-06-10 (×5): qty 1

## 2023-06-10 MED ORDER — PRAVASTATIN SODIUM 20 MG PO TABS
40.0000 mg | ORAL_TABLET | Freq: Every day | ORAL | Status: DC
  Administered 2023-06-10 – 2023-06-12 (×3): 40 mg via ORAL
  Filled 2023-06-10 (×3): qty 2

## 2023-06-10 MED ORDER — PREGABALIN 50 MG PO CAPS
50.0000 mg | ORAL_CAPSULE | Freq: Two times a day (BID) | ORAL | Status: DC
  Administered 2023-06-10 – 2023-06-13 (×7): 50 mg via ORAL
  Filled 2023-06-10 (×7): qty 1

## 2023-06-10 MED ORDER — HEPARIN SODIUM (PORCINE) 5000 UNIT/ML IJ SOLN
5000.0000 [IU] | Freq: Two times a day (BID) | INTRAMUSCULAR | Status: DC
  Administered 2023-06-10 – 2023-06-11 (×3): 5000 [IU] via SUBCUTANEOUS
  Filled 2023-06-10 (×3): qty 1

## 2023-06-10 MED ORDER — ESCITALOPRAM OXALATE 10 MG PO TABS
10.0000 mg | ORAL_TABLET | Freq: Every day | ORAL | Status: DC
  Administered 2023-06-10 – 2023-06-12 (×3): 10 mg via ORAL
  Filled 2023-06-10 (×3): qty 1

## 2023-06-10 MED ORDER — SODIUM CHLORIDE 0.9 % IV SOLN
3.0000 g | Freq: Two times a day (BID) | INTRAVENOUS | Status: DC
  Administered 2023-06-10 – 2023-06-13 (×6): 3 g via INTRAVENOUS
  Filled 2023-06-10 (×6): qty 8

## 2023-06-10 MED ORDER — IPRATROPIUM-ALBUTEROL 0.5-2.5 (3) MG/3ML IN SOLN: 3.0000 mL | Freq: Four times a day (QID) | RESPIRATORY_TRACT | Status: DC | PRN

## 2023-06-10 MED ORDER — GUAIFENESIN ER 600 MG PO TB12
1200.0000 mg | ORAL_TABLET | Freq: Two times a day (BID) | ORAL | Status: DC
  Administered 2023-06-10 – 2023-06-13 (×7): 1200 mg via ORAL
  Filled 2023-06-10 (×7): qty 2

## 2023-06-10 MED ORDER — SODIUM CHLORIDE 0.9 % IV SOLN: INTRAVENOUS | Status: AC

## 2023-06-10 MED ORDER — SODIUM CHLORIDE 0.9 % IV SOLN
200.0000 mg | Freq: Once | INTRAVENOUS | Status: AC
  Administered 2023-06-10: 200 mg via INTRAVENOUS
  Filled 2023-06-10: qty 40

## 2023-06-10 MED ORDER — IPRATROPIUM-ALBUTEROL 0.5-2.5 (3) MG/3ML IN SOLN
3.0000 mL | Freq: Four times a day (QID) | RESPIRATORY_TRACT | Status: DC
  Administered 2023-06-10: 3 mL via RESPIRATORY_TRACT
  Filled 2023-06-10 (×2): qty 3

## 2023-06-10 MED ORDER — REMDESIVIR 100 MG IV SOLR
200.0000 mg | Freq: Once | INTRAVENOUS | Status: DC
  Filled 2023-06-10: qty 40

## 2023-06-10 NOTE — ED Notes (Signed)
 CCMD notified to monitor pt.

## 2023-06-10 NOTE — Evaluation (Addendum)
 Clinical/Bedside Swallow Evaluation Patient Details  Name: MELISS FLEEK MRN: [id number] Date of Birth: 12/16/1940  Today's Date: 06/10/2023 Time: SLP Start Time (ACUTE ONLY): 1555 SLP Stop Time (ACUTE ONLY): 1645 SLP Time Calculation (min) (ACUTE ONLY): 50 min  Past Medical History:  Past Medical History:  Diagnosis Date   Anxiety    Aortic atherosclerosis (HCC)    Arthritis    Current use of long term anticoagulation    APIXABAN    Dysrhythmia    A-fib   GERD (gastroesophageal reflux disease)    History of 2019 novel coronavirus disease (COVID-19) 07/2019   History of kidney stones    Hypercholesteremia    Hypertension    IDA (iron deficiency anemia)    Paroxysmal atrial fibrillation (HCC)    Skin cancer    Skin - squamous   Past Surgical History:  Past Surgical History:  Procedure Laterality Date   ABDOMINAL HYSTERECTOMY     BREAST CYST ASPIRATION Left 11/21/2015   resolved   BREAST EXCISIONAL BIOPSY Left 1973   benign   CATARACT EXTRACTION W/PHACO Right 07/13/2014   Procedure: CATARACT EXTRACTION PHACO AND INTRAOCULAR LENS PLACEMENT (IOC);  Surgeon: Annell Kidney, MD;  Location: [facility];  Service: Ophthalmology;  Laterality: Right;   CATARACT EXTRACTION W/PHACO Left 10/05/2014   Procedure: CATARACT EXTRACTION PHACO AND INTRAOCULAR LENS PLACEMENT (IOC);  Surgeon: Annell Kidney, MD;  Location: [facility];  Service: Ophthalmology;  Laterality: Left;   COLONOSCOPY     JOINT REPLACEMENT     knee replacement right   LUMBAR LAMINECTOMY/DECOMPRESSION MICRODISCECTOMY N/A 05/10/2020   Procedure: L3-5 DECOMPRESSION;  Surgeon: Jodeen Munch, MD;  Location: [facility];  Service: Neurosurgery;  Laterality: N/A;   SEPTOPLASTY     TOTAL HIP ARTHROPLASTY Left 06/16/2020   Procedure: TOTAL HIP ARTHROPLASTY ANTERIOR APPROACH;  Surgeon: Molli Angelucci, MD;  Location: [facility];  Service: Orthopedics;  Laterality: Left;   TOTAL KNEE REVISION Right  02/15/2021   Procedure: TOTAL KNEE REVISION;  Surgeon: Molli Angelucci, MD;  Location: [facility];  Service: Orthopedics;  Laterality: Right;   HPI:  Pt is a 83 y.o. female with medical history significant of PAF on Eliquis , recently diagnosed C. difficile on vancomycin  p.o., HTN, HLD, mild Dementia, sent from her facility for evaluation of new onset of symptoms of cough fever weakness and confusion.   Patient is confused and provided only part of the history, most of history provided by son at bedside.  Patient started to have watery diarrhea last Friday, and was tested positive for C. difficile colitis and started on vancomycin  4 times a day p.o.  Since then her diarrhea has been improving, patient reported only 1 episode of loose BM yesterday.  Patient reported that she has been having a cough for 2 to 3 days but cannot tell me about details.  She also reported that she has frequent episodes of cough and choking after eat solid food.  Pt was found to be COVID+ this admit. CT Abd: Lower chest: Small pleural effusions are noted with adjacent bibasilar atelectasis or edema. Right middle lobe atelectasis or pneumonia is noted.     Assessment / Plan / Recommendation  Clinical Impression   Pt seen for BSE. Pt awakened easily, verbal and able to answer basic questiosn re: self in her immediate environment. She followed basic instructions w/ cue. Noted Mild Dementia dx per chart. Son present.  OF NOTE: pt c/o Esophageal phase Dysmotility when asked if any difficulty when eating/drinking -- she pointed  to mid-sternum area.  On [address] O2 support 4L; WBC WNL.   Pt appears to present w/ functional oropharyngeal phase swallowing w/ No overt oropharyngeal phase dysphagia noted, No neuromuscular deficits noted. Pt consumed po trials w/ No overt, clinical s/s of aspiration during the trials.  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  Pt appears at reduced risk for aspiration from an oropharyngeal  phase standpoint following general aspiration precautions. HOWEVER, ANY Esophageal phase Dysmotility or Regurgitation of Reflux material can increase risk for aspiration of the Reflux material during Retrograde flow thus impact Voicing and Pulmonary status. Pt also has other challenging factors that could impact oropharyngeal swallowing to include Dementia, deconditioning/weakness, hospitalization, and advanced age. These factors can increase risk for dysphagia as well as decreased oral intake overall.   During po trials, pt consumed all consistencies w/ no overt coughing, decline in vocal quality, or change in respiratory presentation during/post trials. O2 sats remained 96%. Oral phase appeared [facility] w/ timely bolus management, mastication, and control of bolus propulsion for A-P transfer for swallowing. Oral clearing achieved w/ all trial consistencies -- moistened, soft foods given. OM Exam appeared [facility] w/ no unilateral weakness noted. Speech Clear. Pt fed self w/ setup support.   Recommend a fairly Regular consistency diet w/ well-Cut meats, moistened foods; Thin liquids -- carefully monitor straw use, and pt should Hold Cup when drinking. Recommend general aspiration precautions including reducing distractions during meals. Recommend tray setup and sitting up support. REFLUX precautions. Pills WHOLE in Puree for safer, easier swallowing -- for ease of Esophageal clearing. This is encouraged now and for D/C.   Education given on Pills in Puree; food consistencies and easy to eat options; general aspiration and Reflux precautions to pt and Son w/ recommendation to f/u w/ GI post D/C. MD/NSG to reconsult if any new needs arise during admit. MD/NSG updated. Recommend Dietician f/u for support if needed. Recommend GI f/u for assessment/management. Precautions posted in chart. 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 Recommend general aspiration precautions including reducing distractions during meals. Recommend tray setup and sitting up support. REFLUX precautions.   Medication Administration: Whole meds with puree    Other  Recommendations Recommended Consults: Consider GI evaluation;Consider esophageal assessment (Dietician) Oral Care Recommendations: Oral care BID;Oral care before and after PO;Patient independent with oral care (setup)    Recommendations for follow up therapy are one component of a multi-disciplinary discharge planning process, led by the attending physician.  Recommendations may be updated based on patient status, additional functional criteria and insurance authorization.  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 female with medical history significant of  PAF on Eliquis , recently diagnosed C. difficile on vancomycin  p.o., HTN, HLD, mild dementia, sent from her facility for evaluation of new onset of symptoms of cough fever weakness and confusion.     Patient is confused and provided only part of the history, most of history provided by son at bedside.  Patient started  to have watery diarrhea last Friday, and was tested positive for C. difficile colitis and started on vancomycin  4 times a day p.o.  Since then her diarrhea has been improving, patient reported only 1 episode of loose BM yesterday.  Patient reported that she has been having a cough for 2 to 3 days but cannot tell me about details.  She also reported that she has frequent episodes of cough and choking after eat solid food.  Pt was found to be COVID+ this admit. Type of Study: Bedside Swallow Evaluation Previous Swallow Assessment: none Diet Prior to this Study: NPO (regular diet at facility per report) Temperature Spikes Noted: Yes (WBC 4.6) Respiratory Status: Nasal cannula (4L) History of Recent Intubation: No Behavior/Cognition: Alert;Cooperative;Pleasant mood;Confused;Distractible;Requires cueing (Baseline Dementia) Oral Cavity Assessment: Within Functional Limits Oral Care Completed by SLP: Yes Oral Cavity - Dentition: Adequate natural dentition Vision: Functional for self-feeding Self-Feeding Abilities: Able to feed self;Needs assist;Needs set up Patient Positioning: Upright in bed (MOD+ assist) Baseline Vocal Quality: Normal Volitional Cough: Strong Volitional Swallow: Able to elicit    Oral/Motor/Sensory Function Overall Oral Motor/Sensory Function: Within functional limits   Ice Chips Ice chips: Within functional limits Presentation: Spoon (fed; 2 trials)   Thin Liquid Thin Liquid: Within functional limits Presentation: Cup;Self Fed;Straw (~6 ozs total) Other Comments: water, tea    Nectar Thick Nectar Thick Liquid: Not tested   Honey Thick Honey Thick Liquid: Not tested   Puree Puree: Within functional limits Presentation: Spoon (fed; 5 trials)   Solid     Solid: Within functional limits Presentation: Self Fed (4 trials)         Darla Edward, MS, CCC-SLP Speech Language Pathologist Rehab Services; [facility] [phone]  (ascom) Janari Yamada 06/10/2023,4:57 PM

## 2023-06-10 NOTE — H&P (Addendum)
 History and Physical    [name] WGN:[id number] DOB: [birth year] DOA: 06/10/2023  PCP: Delmus Ferri, MD (Confirm with patient/family/NH records and if not entered, this has to be entered at [facility] point of entry) Patient coming from: Assisted living  I have personally briefly reviewed patient's old medical records in [facility]  Chief Complaint: Cough fever weakness and confusion  HPI: [name] is a 83 y.o. female with medical history significant of PAF on Eliquis , recently diagnosed C. difficile on vancomycin  p.o., HTN, HLD, mild dementia, sent from her facility for evaluation of new onset of symptoms of cough fever weakness and confusion.  Patient is confused and provided only part of the history, most of history provided by son at bedside.  Patient started to have watery diarrhea last Friday, and was tested positive for C. difficile colitis and started on vancomycin  4 times a day p.o.  Since then her diarrhea has been improving, patient reported only 1 episode of loose BM yesterday.  Today however patient was found to have productive cough, hypoxia with O2 saturation 84% on room air, and confused.  Patient reported that she has been having a cough for 2 to 3 days but cannot tell me about details.  She also reported that she has frequent episodes of cough and choking after eat solid food.  Son reported no previous history of aspiration or pneumonia.  In addition, son also reported the patient had a obstructive kidney stone and underwent stenting and some " big game antibiotics" in mid March which patient completed 1 month ago.  Otherwise no recent antibiotic usage.  ED Course: Temperature 100.9 heart rate 50-60s, blood pressure 140/50 O2 saturation 98% on 4 L.  CT abdomen pelvis showed no acute findings but incidental finding of bilateral lower field infiltrates compatible with pneumonia.  Blood work showed COVID positive, WBC 4.6 hemoglobin 10.6 BUN 34 creatinine 1.9 bicarb 22K  4.0.  CT head negative for acute findings.  Urine test negative for UTI.  Patient was given remdesivir, and Unasyn in the ED.  Review of Systems: As per HPI otherwise 14 point review of systems negative.    Past Medical History:  Diagnosis Date   Anxiety    Aortic atherosclerosis (HCC)    Arthritis    Current use of long term anticoagulation    APIXABAN    Dysrhythmia    A-fib   GERD (gastroesophageal reflux disease)    History of 2019 novel coronavirus disease (COVID-19) 07/2019   History of kidney stones    Hypercholesteremia    Hypertension    IDA (iron deficiency anemia)    Paroxysmal atrial fibrillation (HCC)    Skin cancer    Skin - squamous    Past Surgical History:  Procedure Laterality Date   ABDOMINAL HYSTERECTOMY     BREAST CYST ASPIRATION Left 11/21/2015   resolved   BREAST EXCISIONAL BIOPSY Left 1973   benign   CATARACT EXTRACTION W/PHACO Right 07/13/2014   Procedure: CATARACT EXTRACTION PHACO AND INTRAOCULAR LENS PLACEMENT (IOC);  Surgeon: Annell Kidney, MD;  Location: [facility];  Service: Ophthalmology;  Laterality: Right;   CATARACT EXTRACTION W/PHACO Left 10/05/2014   Procedure: CATARACT EXTRACTION PHACO AND INTRAOCULAR LENS PLACEMENT (IOC);  Surgeon: Annell Kidney, MD;  Location: [facility];  Service: Ophthalmology;  Laterality: Left;   COLONOSCOPY     JOINT REPLACEMENT     knee replacement right   LUMBAR LAMINECTOMY/DECOMPRESSION MICRODISCECTOMY N/A 05/10/2020   Procedure: L3-5 DECOMPRESSION;  Surgeon: Jodeen Munch, MD;  Location: [facility];  Service: Neurosurgery;  Laterality: N/A;   SEPTOPLASTY     TOTAL HIP ARTHROPLASTY Left 06/16/2020   Procedure: TOTAL HIP ARTHROPLASTY ANTERIOR APPROACH;  Surgeon: Molli Angelucci, MD;  Location: [facility];  Service: Orthopedics;  Laterality: Left;   TOTAL KNEE REVISION Right 02/15/2021   Procedure: TOTAL KNEE REVISION;  Surgeon: Molli Angelucci, MD;  Location: [facility];  Service:  Orthopedics;  Laterality: Right;     reports that she has never smoked. She has never used smokeless tobacco. She reports that she does not drink alcohol  and does not use drugs.  Allergies  Allergen Reactions   Aspirin     Causes bleeding in stomach   Nsaids Other (See Comments)    Bleeding in stomach   Methocarbamol Other (See Comments)    Hallucinations   Morphine  And Codeine Other (See Comments)    Disoriented hallucinations   Oxycodone Other (See Comments)    Disoriented Hallucinations     Family History  Problem Relation Age of Onset   Dementia Mother    Heart attack Father    CAD Father    Breast cancer Neg Hx     Prior to Admission medications   Medication Sig Start Date End Date Taking? Authorizing Provider  acetaminophen  (TYLENOL ) 500 MG tablet Take 2 tablets (1,000 mg total) by mouth every 6 (six) hours as needed for mild pain (pain score 1-3 or temp > 100.5). Patient taking differently: Take 650 mg by mouth every 6 (six) hours as needed for moderate pain (pain score 4-6) (pain score 1-3 or temp > 100.5). 02/21/21   Coralyn Derry, PA-C  amLODipine  (NORVASC ) 2.5 MG tablet Take 2.5 mg by mouth daily. 04/15/23   [provider]  amLODipine  (NORVASC ) 5 MG tablet Take 5 mg by mouth in the morning. Patient not taking: Reported on 06/10/2023    [provider]  carboxymethylcellulose (REFRESH PLUS) 0.5 % SOLN Place 1 drop into both eyes daily as needed (dry eyes).    [provider]  Cholecalciferol  (VITAMIN D ) 50 MCG (2000 UT) CAPS Take 2,000 Units by mouth in the morning.    [provider]  dronedarone  (MULTAQ ) 400 MG tablet Take 400 mg by mouth 2 (two) times daily with a meal. 09/28/15   [provider]  ELIQUIS  2.5 MG TABS tablet Take 2.5 mg by mouth 2 (two) times daily. 04/14/23   [provider]  ELIQUIS  5 MG TABS tablet Take 1 tablet by mouth 2 times daily. Call clinic - overdue for appt with Dr Nunzio Belch. Patient not  taking: Reported on 06/10/2023 08/07/17   Jolly Needle, MD  escitalopram  (LEXAPRO ) 10 MG tablet Take 10 mg by mouth at bedtime.    [provider]  estradiol  (ESTRACE ) 0.5 MG tablet Take 0.5 mg by mouth every morning. Patient not taking: Reported on 06/10/2023    [provider]  estradiol  (ESTRACE ) 0.5 MG tablet Take 0.25 mg by mouth daily. For 30 days 06/02/23 07/02/23  [provider]  famotidine (PEPCID) 20 MG tablet Take 20 mg by mouth daily. 05/14/23   [provider]  losartan (COZAAR) 25 MG tablet Take 25 mg by mouth daily. 06/05/23   [provider]  omeprazole (PRILOSEC) 20 MG capsule Take 20 mg by mouth 2 (two) times daily. 04/15/22   [provider]  pravastatin  (PRAVACHOL ) 40 MG tablet Take 40 mg by mouth at bedtime. 02/02/16   [provider]  pregabalin  (LYRICA ) 50 MG capsule Take 1 capsule (50 mg total) by mouth 2 (two) times daily. 08/13/22 02/09/23  Cephus Collin, MD  traMADol  (ULTRAM -ER) 100 MG 24 hr tablet Take 1 tablet (100 mg total) by mouth daily. 11/12/22   Cephus Collin, MD  vancomycin  (VANCOCIN ) 125 MG capsule Take 125 mg by mouth 4 (four) times daily. 06/06/23   [provider]    Physical Exam: Vitals:   06/10/23 0930 06/10/23 1000 06/10/23 1047 06/10/23 1053  BP: (!) 154/48 (!) 145/63    Pulse: (!) 56 (!) 58  (!) 53  Resp: 17 16  14   Temp:   99 F (37.2 C)   TempSrc:   Oral   SpO2: 91% 99%  91%    Constitutional: NAD, calm, comfortable Vitals:   06/10/23 0930 06/10/23 1000 06/10/23 1047 06/10/23 1053  BP: (!) 154/48 (!) 145/63    Pulse: (!) 56 (!) 58  (!) 53  Resp: 17 16  14   Temp:   99 F (37.2 C)   TempSrc:   Oral   SpO2: 91% 99%  91%   Eyes: PERRL, lids and conjunctivae normal ENMT: Mucous membranes are dry. Posterior pharynx clear of any exudate or lesions.Normal dentition.  Neck: normal, supple, no masses, no thyromegaly Respiratory: clear to auscultation bilaterally, no wheezing,  coarse crackles on bilateral lower fields, increasing respiratory effort. No accessory muscle use.  Cardiovascular: Regular rate and rhythm, systolic murmur on heart base. No extremity edema. 2+ pedal pulses. No carotid bruits.  Abdomen: no tenderness, no masses palpated. No hepatosplenomegaly. Bowel sounds positive.  Musculoskeletal: no clubbing / cyanosis. No joint deformity upper and lower extremities. Good ROM, no contractures. Normal muscle tone.  Skin: no rashes, lesions, ulcers. No induration Neurologic: CN 2-12 grossly intact. Sensation intact, DTR normal. Strength 5/5 in all 4.  Psychiatric: Orientated to person and place, confused about time   Labs on Admission: I have personally reviewed following labs and imaging studies  CBC: Recent Labs  Lab 06/10/23 0838  WBC 4.6  NEUTROABS 2.4  HGB 10.6*  HCT 32.4*  MCV 91.0  PLT 154   Basic Metabolic Panel: Recent Labs  Lab 06/10/23 0838  NA 134*  K 4.0  CL 104  CO2 22  GLUCOSE 94  BUN 34*  CREATININE 1.90*  CALCIUM 8.0*   GFR: CrCl cannot be calculated (Unknown ideal weight.). Liver Function Tests: Recent Labs  Lab 06/10/23 0838  AST 19  ALT 16  ALKPHOS 37*  BILITOT 0.5  PROT 6.2*  ALBUMIN 2.9*   Recent Labs  Lab 06/10/23 0838  LIPASE 26   No results for input(s): "AMMONIA" in the last 168 hours. Coagulation Profile: Recent Labs  Lab 06/10/23 0838  INR 1.7*   Cardiac Enzymes: No results for input(s): "CKTOTAL", "CKMB", "CKMBINDEX", "TROPONINI" in the last 168 hours. BNP (last 3 results) No results for input(s): "PROBNP" in the last 8760 hours. HbA1C: No results for input(s): "HGBA1C" in the last 72 hours. CBG: No results for input(s): "GLUCAP" in the last 168 hours. Lipid Profile: No results for input(s): "CHOL", "HDL", "LDLCALC", "TRIG", "CHOLHDL", "LDLDIRECT" in the last 72 hours. Thyroid Function Tests: No results for input(s): "TSH", "T4TOTAL", "FREET4", "T3FREE", "THYROIDAB" in the last 72  hours. Anemia Panel: No results for input(s): "VITAMINB12", "FOLATE", "FERRITIN", "TIBC", "IRON", "RETICCTPCT" in the last 72 hours. Urine analysis:    Component Value Date/Time   COLORURINE YELLOW (A) 06/10/2023 0900   APPEARANCEUR CLOUDY (A) 06/10/2023 0900   APPEARANCEUR  [place] 12/05/2013 1326   LABSPEC 1.015 06/10/2023 0900   LABSPEC 1.029 12/05/2013 1326   PHURINE 5.0 06/10/2023 0900   GLUCOSEU NEGATIVE 06/10/2023 0900   GLUCOSEU Negative 12/05/2013 1326   HGBUR SMALL (A) 06/10/2023 0900   BILIRUBINUR NEGATIVE 06/10/2023 0900   BILIRUBINUR Negative 12/05/2013 1326   KETONESUR NEGATIVE 06/10/2023 0900   PROTEINUR 30 (A) 06/10/2023 0900   NITRITE NEGATIVE 06/10/2023 0900   LEUKOCYTESUR NEGATIVE 06/10/2023 0900   LEUKOCYTESUR Negative 12/05/2013 1326    Radiological Exams on Admission: CT ABDOMEN PELVIS WO CONTRAST Result Date: 06/10/2023 CLINICAL DATA:  Fever. EXAM: CT ABDOMEN AND PELVIS WITHOUT CONTRAST TECHNIQUE: Multidetector CT imaging of the abdomen and pelvis was performed following the standard protocol without IV contrast. RADIATION DOSE REDUCTION: This exam was performed according to the departmental dose-optimization program which includes automated exposure control, adjustment of the mA and/or kV according to patient size and/or use of iterative reconstruction technique. COMPARISON:  Jun 19, 2018. FINDINGS: Lower chest: Small pleural effusions are noted with adjacent bibasilar atelectasis or edema. Right middle lobe atelectasis or pneumonia is noted. Hepatobiliary: No focal liver abnormality is seen. No gallstones, gallbladder wall thickening, or biliary dilatation. Pancreas: Unremarkable. No pancreatic ductal dilatation or surrounding inflammatory changes. Spleen: Normal in size without focal abnormality. Adrenals/Urinary Tract: Adrenal glands are unremarkable. Kidneys are normal, without renal calculi, focal lesion, or hydronephrosis. Bladder is unremarkable. Stomach/Bowel:  Stomach is unremarkable. There is no evidence of bowel obstruction or inflammation. Diverticulosis of descending and sigmoid colon is noted without inflammation. The appendix is not visualized. Vascular/Lymphatic: Aortic atherosclerosis. No enlarged abdominal or pelvic lymph nodes. Reproductive: Status post hysterectomy. No adnexal masses. Other: No ascites or hernia. Musculoskeletal: Status post left total hip arthroplasty. No acute osseous abnormality is noted. IMPRESSION: Small pleural effusions are noted with adjacent bibasilar atelectasis or edema. Right middle lobe atelectasis or pneumonia is noted. Diverticulosis of descending and sigmoid colon is noted without inflammation. Aortic Atherosclerosis (ICD10-I70.0). Electronically Signed   By: Rosalene Colon M.D.   On: 06/10/2023 11:45   CT HEAD WO CONTRAST ( ) Result Date: 06/10/2023 CLINICAL DATA:  Altered mental status, weakness, febrile. EXAM: CT HEAD WITHOUT CONTRAST TECHNIQUE: Contiguous axial images were obtained from the base of the skull through the vertex without intravenous contrast. RADIATION DOSE REDUCTION: This exam was performed according to the departmental dose-optimization program which includes automated exposure control, adjustment of the mA and/or kV according to patient size and/or use of iterative reconstruction technique. COMPARISON:  CT head 02/21/2021. FINDINGS: Brain: No acute intracranial hemorrhage. No CT evidence of acute infarct. No edema, mass effect, or midline shift. The basilar cisterns are patent. Ventricles: The ventricles are normal. Vascular: Atherosclerotic calcifications of the carotid siphons. No hyperdense vessel. Skull: No acute or aggressive finding. Orbits: Orbits are symmetric. Sinuses: Mucosal thickening in the ethmoid and sphenoid sinuses. Mild mucosal thickening extending into the left frontal sinus. Other: Mastoid air cells are clear. IMPRESSION: No CT evidence of acute intracranial abnormality. Mild  paranasal sinus disease as above. Electronically Signed   By: Denny Flack M.D.   On: 06/10/2023 10:51   DG Chest Port 1 View Result Date: 06/10/2023 CLINICAL DATA:  Sepsis. EXAM: PORTABLE CHEST 1 VIEW COMPARISON:  January 07, 2010. FINDINGS: Stable cardiomediastinal silhouette. Bibasilar opacities are noted concerning for pneumonia or atelectasis. Left lingular subsegmental atelectasis is noted. Bony thorax is unremarkable. IMPRESSION: Bibasilar opacities concerning for pneumonia or atelectasis. Electronically Signed   By: Irving Mantle.D.  On: 06/10/2023 09:57    EKG: Independently reviewed.  Sinus bradycardia, no acute ST changes.  Assessment/Plan Principal Problem:   Sepsis (HCC) Active Problems:   CAP (community acquired pneumonia)  (please populate well all problems here in Problem List. (For example, if patient is on BP meds at home and you resume or decide to hold them, it is a problem that needs to be her. Same for CAD, COPD, HLD and so on)  Sepsis, with acute endorgan damage - Sepsis evidenced by new onset of fever, and new onset of hypoxia, source of infection likely multifocal pneumonia/aspiration pneumonia and concurrent COVID-19 infection. - With end organ damage including but AKI and acute metabolic encephalopathy - Continue Unasyn to treat aspiration pneumonia, speech evaluation - Supportive care, DuoNebs, guaifenesin, incentive spirometry and flutter valve  Multifocal pneumonia Aspiration pneumonia - Aspiration precaution - N.p.o. for now, speech evaluation - Antibiotics coverage as above  COVID-19 infection - No significant peripheral interstitial changes to indicate viral pneumonia, 3 dose of remdesivir to prevent progression of COVID-19 infection into severe infection.  AKI - Likely prerenal secondary to sepsis - Clinically still appears to be volume contracted, we will continue IV fluid for 10 hours  C. difficile colitis - Improving, unlikely to be the  source for sepsis - Continue p.o. vancomycin    PAF - In sinus bradycardia, not on rate control medications - No longer on Eliquis , family does not know why  HTN, uncontrolled - Hold off amlodipine  and losartan - Start as needed hydralazine   Deconditioning - PT evaluation  CODE STATUS - Confirmed with son that patient is DNR/DNI  DVT prophylaxis: Heparin subcu Code Status: DNR Family Communication: Son at bedside Disposition Plan: Patient is sick with sepsis from multifocal pneumonia requiring IV fluid and IV antibiotics, and hypoxia requiring oxygen support, expect more than 2 midnight hospital stay Consults called: None Admission status: Telemetry admission   [place] MD [facility] Pager [phone]  06/10/2023, 12:38 PM

## 2023-06-10 NOTE — ED Triage Notes (Signed)
 Pt bib ems from with altered mental status, weakness, febrile. Recently dx with cdiff.  166/66 100.79F 86% RA 4L [place] 95% Cbg 100

## 2023-06-10 NOTE — ED Provider Notes (Signed)
 [facility] Provider Note    Event Date/Time   First MD Initiated Contact with Patient 06/10/23 [phone]     (approximate)   History   Altered Mental Status  Pt bib ems from with altered mental status, weakness, febrile. Recently dx with cdiff.  166/66 100.83F 86% RA 4L [place] 95% Cbg 100   HPI [name] is a 83 y.o. female PMH atrial fibrillation on eliquis , hypertension, hyperlipidemia, urolithiasis with urinary stent in place presents for evaluation of altered mental status, weakness, fever -Per EMS, patient is coming from her living facility due to weakness.  Was reportedly diagnosed with C. difficile about 1 week ago and has been having a gradually declining mental state.  Febrile to 100.6 and hypoxic to 84% on room air with otherwise stable vital signs. -Patient reportedly AO x 4 at baseline, currently AO x 1-2.  Does follow basic commands but much more confused than usual.  Could barely stand with heavy assistance, usually independent. -Son is reportedly en route to hospital  I reviewed patient's outpatient records including recent urology visit, 05/28/2023, notes patient is status post ureteroscopic stone extraction on 04/09/2023 from right ureter.  R US  with no hydronephrosis at that time.  Also reviewed cardiology visit from 04/30/2023.     Physical Exam   Triage Vital Signs:   Most recent vital signs: Vitals:   06/10/23 1047 06/10/23 1053  BP:    Pulse:  (!) 53  Resp:  14  Temp: 99 F (37.2 C)   SpO2:  91%     General: Awake, no distress.  HEENT: Normocephalic, atraumatic Neck:  Full range of motion, no meningismus CV:  Good peripheral perfusion.  Regular rate, irregular rhythm, RP 2+ Resp:  Normal effort.  Diminished breath sounds bilateral bases, mild crackles left lower lung field Abd:  No distention. Nontender to deep palpation throughout, no CVAT Neuro:  No facial asymmetry, follows all commands, responds to basic questions  appropriately.  AO x 1 (person only), no focal motor deficit appreciated    ED Results / Procedures / Treatments   Labs (all labs ordered are listed, but only abnormal results are displayed) Labs Reviewed  RESP PANEL BY RT-PCR (RSV, FLU A&B, COVID)  RVPGX2 - Abnormal; Notable for the following components:      Result Value   SARS Coronavirus 2 by RT PCR POSITIVE (*)    All other components within normal limits  COMPREHENSIVE METABOLIC PANEL WITH GFR - Abnormal; Notable for the following components:   Sodium 134 (*)    BUN 34 (*)    Creatinine, Ser 1.90 (*)    Calcium 8.0 (*)    Total Protein 6.2 (*)    Albumin 2.9 (*)    Alkaline Phosphatase 37 (*)    GFR, Estimated 26 (*)    All other components within normal limits  CBC WITH DIFFERENTIAL/PLATELET - Abnormal; Notable for the following components:   RBC 3.56 (*)    Hemoglobin 10.6 (*)    HCT 32.4 (*)    All other components within normal limits  PROTIME-INR - Abnormal; Notable for the following components:   Prothrombin Time 19.9 (*)    INR 1.7 (*)    All other components within normal limits  BRAIN NATRIURETIC PEPTIDE - Abnormal; Notable for the following components:   B Natriuretic Peptide 880.4 (*)    All other components within normal limits  URINALYSIS, W/ REFLEX TO CULTURE (INFECTION SUSPECTED) - Abnormal; Notable for the  following components:   Color, Urine YELLOW (*)    APPearance CLOUDY (*)    Hgb urine dipstick SMALL (*)    Protein, ur 30 (*)    Bacteria, UA FEW (*)    All other components within normal limits  CULTURE, BLOOD (ROUTINE X 2)  CULTURE, BLOOD (ROUTINE X 2)  LACTIC ACID, PLASMA  LIPASE, BLOOD     EKG  See ED course below.    RADIOLOGY Radiology interpreted by myself and radiology reports reviewed.  Chest x-ray and CT with possible pneumonia.    PROCEDURES:  Critical Care performed: Yes, see critical care procedure note(s)  .Critical Care  Performed by: Collis Deaner,  MD Authorized by: Collis Deaner, MD   Critical care provider statement:    Critical care time (minutes):  30   Critical care was necessary to treat or prevent imminent or life-threatening deterioration of the following conditions:  Respiratory failure   Critical care was time spent personally by me on the following activities:  Development of treatment plan with patient or surrogate, discussions with consultants, evaluation of patient's response to treatment, examination of patient, ordering and review of laboratory studies, ordering and review of radiographic studies, ordering and performing treatments and interventions, pulse oximetry, re-evaluation of patient's condition and review of old charts   I assumed direction of critical care for this patient from another provider in my specialty: no     Care discussed with: admitting provider      MEDICATIONS ORDERED IN ED: Medications  remdesivir 200 mg in sodium chloride  0.9% 250 mL IVPB (0 mg Intravenous Stopped 06/10/23 1213)    Followed by  remdesivir 100 mg in sodium chloride  0.9 % 100 mL IVPB (has no administration in time range)  acetaminophen  (TYLENOL ) tablet 650 mg (has no administration in time range)  traMADol  (ULTRAM -ER) 24 hr tablet 100 mg (has no administration in time range)  vancomycin  (VANCOCIN ) capsule 125 mg (has no administration in time range)  losartan (COZAAR) tablet 25 mg (has no administration in time range)  pravastatin  (PRAVACHOL ) tablet 40 mg (has no administration in time range)  dronedarone  (MULTAQ ) tablet 400 mg (has no administration in time range)  escitalopram  (LEXAPRO ) tablet 10 mg (has no administration in time range)  famotidine (PEPCID) tablet 20 mg (has no administration in time range)  pantoprazole  (PROTONIX ) EC tablet 40 mg (has no administration in time range)  apixaban  (ELIQUIS ) tablet 2.5 mg (has no administration in time range)  pregabalin  (LYRICA ) capsule 50 mg (has no administration in time range)   lactobacillus acidophilus (BACID) tablet 2 tablet (has no administration in time range)  ondansetron  (ZOFRAN ) tablet 4 mg (has no administration in time range)    Or  ondansetron  (ZOFRAN ) injection 4 mg (has no administration in time range)  Ampicillin-Sulbactam (UNASYN) 3 g in sodium chloride  0.9 % 100 mL IVPB (has no administration in time range)  acetaminophen  (TYLENOL ) tablet 1,000 mg (1,000 mg Oral Given 06/10/23 0954)  sodium chloride  0.9 % bolus 1,000 mL (1,000 mLs Intravenous New Bag/Given 06/10/23 0954)  dexamethasone  (DECADRON ) tablet 6 mg (6 mg Oral Given 06/10/23 1039)  Ampicillin-Sulbactam (UNASYN) 3 g in sodium chloride  0.9 % 100 mL IVPB (3 g Intravenous New Bag/Given 06/10/23 1212)     IMPRESSION / MDM / ASSESSMENT AND PLAN / ED COURSE  I reviewed the triage vital signs and the nursing notes.  DDX/MDM/AP: Differential diagnosis includes, but is not limited to, fever secondary to C. difficile, no concern for underlying bowel perforation given very benign abdominal exam here, doubt other intra-abdominal pathology at this time.  High concern for underlying pneumonia given hypoxia, consider CHF, do not clinically suspect PE.  Also consider viral syndrome including influenza or COVID-19 causing hypoxia and fever.  Consider underlying recurrent UTI.  Considered but doubt intracranial hemorrhage or stroke, will screen with CT head given patient's age and being on a blood thinner.  Plan: - Labs Axson-Tylenol  - N.p.o. - Chest x-ray - CT head - Isolation precautions - reassess, anticipate admission  Patient's presentation is most consistent with acute presentation with potential threat to life or bodily function.  The patient is on the cardiac monitor to evaluate for evidence of arrhythmia and/or significant heart rate changes.  ED course below.  Admitted to hospitalist service for hypoxemic respiratory failure in the setting of COVID-19 as well as  altered mental status and weakness.  Treated with remdesivir and Decadron .  Question of possible pneumonia, treated empirically with Unasyn in consultation with hospitalist.  Admitted to medicine.  Clinical Course as of 06/10/23 1242  Tue Jun 10, 2023  0848 Collateral gathered from patient's son [name]) who is now at bedside -Notes patient often gets confused when she has urinary tract infections and wonders if this may be contributing -C. difficile diagnosis last Thursday and has been on oral antibiotics since then -Confirms patient is altered from baseline -Confirms DNR/DNI [MM]  0904 CBC with no leukocytosis, improved anemia [MM]  0911 Lactate wnl [MM]  0917 Ecg = sinus rhythm, rate 58, no gross ST elevation or depression, left bundle branch block present, normal axis, normal intervals.  No clear evidence of ischemia nor arrhythmia on my read.  Similar to prior from 01/2021. [MM]  0919 CMP with AKI, will give IV fluid  Chest x-ray with possible right lower lobe consolidation on my interpretation, no evidence of vascular congestion. Radiology read pending. Last echo (04/2020) reviewed, unremarkable.   Will add CTAP to ensure no underlying recurrent urolithiasis/hydronephrosis contributing to AKI [MM]  0930 UA not c/w infxn [MM]  1003 CXR read: IMPRESSION: Bibasilar opacities concerning for pneumonia or atelectasis.   [MM]  1003 Viral panel reviewed, COVID+ [MM]  1100 CT head unremarkable on my interpretation, radiology report below [MM]  1145 Called radiology, states CTAP currently assigned to a radiologist --will await results [MM]  1154 CTAP: IMPRESSION: Small pleural effusions are noted with adjacent bibasilar atelectasis or edema. Right middle lobe atelectasis or pneumonia is noted.  Diverticulosis of descending and sigmoid colon is noted without inflammation.   [MM]  1155 Hospitalist consult order placed  Biotics at this time, suspect COVID as primary etiology -will  discuss further with hospitalist [MM]  1205 Presented to hospitalist for admission, does recommend empiric antibiotics, will de-escalate as needed inpatient [MM]    Clinical Course User Index [MM] Collis Deaner, MD     FINAL CLINICAL IMPRESSION(S) / ED DIAGNOSES   Final diagnoses:  Altered mental status, unspecified altered mental status type  Acute respiratory failure with hypoxia (HCC)  Fever, unspecified fever cause  COVID-19  Pneumonia due to infectious organism, unspecified laterality, unspecified part of lung  AKI (acute kidney injury) (HCC)     Rx / DC Orders   ED Discharge Orders     None        Note:  This document was prepared using Dragon voice recognition software and  may include unintentional dictation errors.   Collis Deaner, MD 06/10/23 1242

## 2023-06-11 DIAGNOSIS — I480 Paroxysmal atrial fibrillation: Secondary | ICD-10-CM

## 2023-06-11 DIAGNOSIS — R7989 Other specified abnormal findings of blood chemistry: Secondary | ICD-10-CM | POA: Diagnosis not present

## 2023-06-11 DIAGNOSIS — A419 Sepsis, unspecified organism: Secondary | ICD-10-CM | POA: Diagnosis not present

## 2023-06-11 DIAGNOSIS — J189 Pneumonia, unspecified organism: Secondary | ICD-10-CM | POA: Diagnosis not present

## 2023-06-11 DIAGNOSIS — N179 Acute kidney failure, unspecified: Secondary | ICD-10-CM

## 2023-06-11 DIAGNOSIS — E872 Acidosis, unspecified: Secondary | ICD-10-CM | POA: Diagnosis not present

## 2023-06-11 DIAGNOSIS — I10 Essential (primary) hypertension: Secondary | ICD-10-CM

## 2023-06-11 DIAGNOSIS — R5381 Other malaise: Secondary | ICD-10-CM

## 2023-06-11 DIAGNOSIS — A0472 Enterocolitis due to Clostridium difficile, not specified as recurrent: Secondary | ICD-10-CM

## 2023-06-11 LAB — CBC
HCT: 30.9 % — ABNORMAL LOW (ref 36.0–46.0)
Hemoglobin: 10.1 g/dL — ABNORMAL LOW (ref 12.0–15.0)
MCH: 29.3 pg (ref 26.0–34.0)
MCHC: 32.7 g/dL (ref 30.0–36.0)
MCV: 89.6 fL (ref 80.0–100.0)
Platelets: 159 10*3/uL (ref 150–400)
RBC: 3.45 MIL/uL — ABNORMAL LOW (ref 3.87–5.11)
RDW: 14.9 % (ref 11.5–15.5)
WBC: 2.6 10*3/uL — ABNORMAL LOW (ref 4.0–10.5)
nRBC: 0 % (ref 0.0–0.2)

## 2023-06-11 LAB — BASIC METABOLIC PANEL WITH GFR
Anion gap: 10 (ref 5–15)
BUN: 33 mg/dL — ABNORMAL HIGH (ref 8–23)
CO2: 18 mmol/L — ABNORMAL LOW (ref 22–32)
Calcium: 8 mg/dL — ABNORMAL LOW (ref 8.9–10.3)
Chloride: 112 mmol/L — ABNORMAL HIGH (ref 98–111)
Creatinine, Ser: 1.41 mg/dL — ABNORMAL HIGH (ref 0.44–1.00)
GFR, Estimated: 37 mL/min — ABNORMAL LOW (ref >60)
Glucose, Bld: 153 mg/dL — ABNORMAL HIGH (ref 70–99)
Potassium: 4.2 mmol/L (ref 3.5–5.1)
Sodium: 140 mmol/L (ref 135–145)

## 2023-06-11 MED ORDER — LACTATED RINGERS IV SOLN: INTRAVENOUS | Status: AC

## 2023-06-11 MED ORDER — APIXABAN 2.5 MG PO TABS
2.5000 mg | ORAL_TABLET | Freq: Two times a day (BID) | ORAL | Status: DC
  Administered 2023-06-11 – 2023-06-13 (×4): 2.5 mg via ORAL
  Filled 2023-06-11 (×4): qty 1

## 2023-06-11 MED ORDER — AMLODIPINE BESYLATE 5 MG PO TABS
5.0000 mg | ORAL_TABLET | Freq: Every day | ORAL | Status: DC
  Administered 2023-06-12 – 2023-06-13 (×2): 5 mg via ORAL
  Filled 2023-06-11 (×2): qty 1

## 2023-06-11 MED ORDER — SODIUM CHLORIDE 0.9 % IV SOLN: 500.0000 mg | INTRAVENOUS | Status: DC

## 2023-06-11 MED ORDER — SODIUM CHLORIDE 0.9 % IV SOLN
100.0000 mg | Freq: Two times a day (BID) | INTRAVENOUS | Status: DC
  Administered 2023-06-11 – 2023-06-12 (×4): 100 mg via INTRAVENOUS
  Filled 2023-06-11 (×5): qty 100

## 2023-06-11 NOTE — Assessment & Plan Note (Signed)
-   PT/OT evaluation-recommending home health

## 2023-06-11 NOTE — Hospital Course (Addendum)
 Taken from H&P.  [name] is a 83 y.o. female with medical history significant of PAF on Eliquis , recently diagnosed C. difficile on vancomycin  p.o., HTN, HLD, mild dementia, sent from her facility for evaluation of new onset of symptoms of cough fever weakness and confusion.   Patient started to have watery diarrhea last Friday, and was tested positive for C. difficile colitis and started on vancomycin  4 times a day p.o.  Since then her diarrhea has been improving, patient reported only 1 episode of loose BM yesterday.   On presentation febrile at 100.9.  Labs came back positive for COVID, rest unremarkable. CTA abdominal pelvis with no intra-abdominal acute abnormality but did have incidental finding of bilateral lower field infiltrates compatible with pneumonia.  Patient was given remdesivir and Unasyn in ED  Patient met criteria for severe sepsis with endorgan damage involving AKI and acute metabolic encephalopathy.  5/21: Afebrile this morning, preliminary blood cultures negative.  Swallow evaluation with no increased risk of aspiration, labs with metabolic acidosis with bicarb at 18, creatinine slowly improving at 1.41, mild leukopenia 2.6. BNP elevated at 880-echocardiogram ordered.  5/22: Hemodynamically stable, labs with small improvement in bicarb 19 but improving creatinine now at 1.27, overnight calling son and concern of hallucination which is common at this age group group likely hospital delirium with acute illness.  Echocardiogram pending  5/23: Cough with mildly bloodstained sputum-still pending echocardiogram.  Bicarb still at 19 so giving some gentle IV fluid.  PT is recommending home health

## 2023-06-11 NOTE — Evaluation (Signed)
 Physical Therapy Evaluation Patient Details Name: [name] MRN: [id number] DOB: 05/26/1940 Today's Date: 06/11/2023  History of Present Illness  [name] is a 83 y.o. female with medical history significant of PAF on Eliquis , recently diagnosed C. difficile on vancomycin  p.o., HTN, HLD, mild dementia, sent from her facility for evaluation of new onset of symptoms of cough fever weakness and confusion.   Clinical Impression  Pt admitted with above diagnosis. Pt currently with functional limitations due to the deficits listed below (see PT Problem List). Pt received upright in bed agreeable to PT. Pt is A&Ox4 but is effortful and takes time for pt to complete. She reports she knows her cognition has been "off". Is able to accurately report PLOF and her residence upon call to son post session. PTA pt is from ALF being mod-I with RW and requires assist with ADL's from staff.   To date, pt performs all activity at supervision. Performs multiple STS from bed and recliner and completes ambulation in room. Reports she is close to her baseline but does feel weaker. Pt in recliner with all needs in reach. Educating pt on importance of OOB mobility for function and pulmonary toileting. Pt receiving HH PT services prior to admission so PT recs encourage return to HHPT upon discharge.      If plan is discharge home, recommend the following: A little help with walking and/or transfers;A little help with bathing/dressing/bathroom;Assistance with cooking/housework;Assist for transportation;Direct supervision/assist for financial management;Help with stairs or ramp for entrance;Direct supervision/assist for medications management   Can travel by private vehicle        Equipment Recommendations None recommended by PT  Recommendations for Other Services       Functional Status Assessment Patient has had a recent decline in their functional status and demonstrates the ability to make significant  improvements in function in a reasonable and predictable amount of time.     Precautions / Restrictions Precautions Precautions: Fall Restrictions Weight Bearing Restrictions Per Provider Order: No      Mobility  Bed Mobility Overal bed mobility: Needs Assistance Bed Mobility: Supine to Sit     Supine to sit: Supervision, HOB elevated       Patient Response: Cooperative  Transfers Overall transfer level: Needs assistance Equipment used: Rolling walker (2 wheels) Transfers: Sit to/from Stand Sit to Stand: Supervision           General transfer comment: to RW    Ambulation/Gait Ambulation/Gait assistance: Supervision Gait Distance (Feet): 40 Feet Assistive device: Rolling walker (2 wheels) Gait Pattern/deviations: Step-through pattern, Decreased step length - right, Decreased step length - left       General Gait Details: slowed cadence, decreased foot clearance  Stairs            Wheelchair Mobility     Tilt Bed Tilt Bed Patient Response: Cooperative  Modified Rankin (Stroke Patients Only)       Balance Overall balance assessment: Needs assistance Sitting-balance support: Bilateral upper extremity supported, Feet supported Sitting balance-Leahy Scale: Good     Standing balance support: Bilateral upper extremity supported, Reliant on assistive device for balance Standing balance-Leahy Scale: Fair Standing balance comment: uses RW at baseline                             Pertinent Vitals/Pain Pain Assessment Pain Assessment: Faces Faces Pain Scale: No hurt    Home Living Family/patient expects to be discharged to::  Assisted living                 Home Equipment: Agricultural consultant (2 wheels) Additional Comments: lives at [facility]    Prior Function Prior Level of Function : Needs assist       Physical Assist : Mobility (physical);ADLs (physical)       ADLs Comments: staff assists with bathing and dressing. Son  drives her to MD appointments.     Extremity/Trunk Assessment   Upper Extremity Assessment Upper Extremity Assessment: Overall WFL for tasks assessed    Lower Extremity Assessment Lower Extremity Assessment: Overall WFL for tasks assessed       Communication   Communication Communication: No apparent difficulties    Cognition Arousal: Alert Behavior During Therapy: WFL for tasks assessed/performed   PT - Cognitive impairments: Memory                       PT - Cognition Comments: A&Ox4. Does take increased time and effort to recall questions. Knows time as in month and day, but thinks it was 2026. Following commands: Intact       Cueing Cueing Techniques: Verbal cues     General Comments      Exercises Other Exercises Other Exercises: benefits of OOB mobility for function and pulmonary toileting.   Assessment/Plan    PT Assessment Patient needs continued PT services  PT Problem List Decreased strength       PT Treatment Interventions DME instruction;Balance training;Gait training;Neuromuscular re-education;Functional mobility training;Patient/family education;Therapeutic activities;Therapeutic exercise    PT Goals (Current goals can be found in the Care Plan section)  Acute Rehab PT Goals Patient Stated Goal: improve her strength PT Goal Formulation: With patient Time For Goal Achievement: 06/25/23 Potential to Achieve Goals: Good    Frequency Min 2X/week     Co-evaluation               AM-PAC PT "6 Clicks" Mobility  Outcome Measure Help needed turning from your back to your side while in a flat bed without using bedrails?: A Little Help needed moving from lying on your back to sitting on the side of a flat bed without using bedrails?: A Little Help needed moving to and from a bed to a chair (including a wheelchair)?: A Little Help needed standing up from a chair using your arms (e.g., wheelchair or bedside chair)?: A Little Help  needed to walk in hospital room?: A Little Help needed climbing 3-5 steps with a railing? : A Little 6 Click Score: 18    End of Session Equipment Utilized During Treatment: Gait belt Activity Tolerance: Patient tolerated treatment well Patient left: in chair;with call bell/phone within reach;with chair alarm set   PT Visit Diagnosis: Muscle weakness (generalized) (M62.81)    Time: 1610-9604 PT Time Calculation (min) (ACUTE ONLY): 21 min   Charges:   PT Evaluation $PT Eval Low Complexity: 1 Low   PT General Charges $$ ACUTE PT VISIT: 1 Visit         Marc Senior. Fairly IV, PT, DPT Physical Therapist- [facility]  06/11/2023, 11:09 AM

## 2023-06-11 NOTE — Assessment & Plan Note (Signed)
 Patient was recently diagnosed with C. difficile colitis and was on p.o. vancomycin . - Continue with p.o. vancomycin  to complete the course

## 2023-06-11 NOTE — Assessment & Plan Note (Signed)
 Blood pressure mildly elevated. -Restarting home amlodipine  -Continue to home losartan due to AKI

## 2023-06-11 NOTE — Assessment & Plan Note (Signed)
 COVID-19 pneumonia Patient initially with concern of severe sepsis, endorgan dysfunction with AKI and altered mental status. Acute metabolic encephalopathy likely with sepsis has been improved.  Patient now at baseline Likely due to COVID-19 infection and pneumonia. Currently on room air -Continue with remdesivir to complete a 3-day course

## 2023-06-11 NOTE — Evaluation (Signed)
 Occupational Therapy Evaluation Patient Details Name: CHAVIE KOLINSKI MRN: [id number] DOB: 1940-10-29 Today's Date: 06/11/2023   History of Present Illness   LOUISE RAWSON is a 83 y.o. female with medical history significant of PAF on Eliquis , recently diagnosed C. difficile on vancomycin  p.o., HTN, HLD, mild dementia, sent from her facility for evaluation of new onset of symptoms of cough fever weakness and confusion.     Clinical Impressions Patient presenting with decreased Ind in self care,balance, functional mobility/transfers, endurance, and safety awareness. Patient reports being mod I with rollator for mobility and self care tasks at baseline. She lives at [facility].  Patient currently functioning at supervision overall for mobility and self care tasks. Pt ambulates to bathroom for toileting needs. She stands at sink for grooming and peri hygiene before returning to recliner chair. Pt dons mesh underwear with sit <>stand from recliner chair with supervision. RN present in room to give medications as therapist exits the room.  Patient will benefit from acute OT to increase overall independence in the areas of ADLs, functional mobility, and safety awareness in order to safely discharge.     If plan is discharge home, recommend the following:   Assistance with cooking/housework;Assist for transportation;Help with stairs or ramp for entrance     Functional Status Assessment   Patient has had a recent decline in their functional status and demonstrates the ability to make significant improvements in function in a reasonable and predictable amount of time.     Equipment Recommendations   None recommended by OT      Precautions/Restrictions   Precautions Precautions: Fall     Mobility Bed Mobility Overal bed mobility: Needs Assistance Bed Mobility: Supine to Sit     Supine to sit: Supervision, HOB elevated          Transfers Overall transfer level: Needs  assistance Equipment used: Rolling walker (2 wheels) Transfers: Sit to/from Stand Sit to Stand: Supervision                  Balance Overall balance assessment: Needs assistance Sitting-balance support: Bilateral upper extremity supported, Feet supported Sitting balance-Leahy Scale: Good     Standing balance support: Bilateral upper extremity supported, Reliant on assistive device for balance Standing balance-Leahy Scale: Fair Standing balance comment: uses RW at baseline                           ADL either performed or assessed with clinical judgement   ADL Overall ADL's : Needs assistance/impaired     Grooming: Wash/dry hands;Standing;Wash/dry face;Supervision/safety       Lower Body Bathing: Supervison/ safety;Sit to/from stand       Lower Body Dressing: Supervision/safety;Sit to/from stand   Toilet Transfer: Supervision/safety;Rolling walker (2 wheels)   Toileting- Clothing Manipulation and Hygiene: Supervision/safety;Sit to/from stand       Functional mobility during ADLs: Supervision/safety       Vision Patient Visual Report: No change from baseline              Pertinent Vitals/Pain Pain Assessment Pain Assessment: Faces Faces Pain Scale: No hurt     Extremity/Trunk Assessment Upper Extremity Assessment Upper Extremity Assessment: Overall WFL for tasks assessed   Lower Extremity Assessment Lower Extremity Assessment: Overall WFL for tasks assessed       Communication Communication Communication: No apparent difficulties   Cognition Arousal: Alert Behavior During Therapy: WFL for tasks assessed/performed Cognition: No apparent impairments  Following commands: Intact       Cueing  General Comments   Cueing Techniques: Verbal cues              Home Living Family/patient expects to be discharged to:: Assisted living                             Home  Equipment: Rolling Walker (2 wheels)   Additional Comments: lives at [facility]      Prior Functioning/Environment Prior Level of Function : Needs assist       Physical Assist : Mobility (physical);ADLs (physical)       ADLs Comments: staff assists with bathing and dressing. Son drives her to MD appointments.    OT Problem List: Decreased strength;Decreased activity tolerance;Decreased knowledge of use of DME or AE;Impaired balance (sitting and/or standing);Decreased knowledge of precautions;Cardiopulmonary status limiting activity   OT Treatment/Interventions: Therapeutic activities;Self-care/ADL training;Therapeutic exercise;Energy conservation;DME and/or AE instruction;Balance training      OT Goals(Current goals can be found in the care plan section)   Acute Rehab OT Goals Patient Stated Goal: to go home OT Goal Formulation: With patient Time For Goal Achievement: 06/25/23 Potential to Achieve Goals: Fair ADL Goals Pt Will Perform Grooming: with modified independence;standing Pt Will Perform Lower Body Dressing: with modified independence;sit to/from stand Pt Will Transfer to Toilet: with modified independence;ambulating Pt Will Perform Toileting - Clothing Manipulation and hygiene: with modified independence;sit to/from stand   OT Frequency:  Min 1X/week       AM-PAC OT "6 Clicks" Daily Activity     Outcome Measure Help from another person eating meals?: None Help from another person taking care of personal grooming?: None Help from another person toileting, which includes using toliet, bedpan, or urinal?: None Help from another person bathing (including washing, rinsing, drying)?: None Help from another person to put on and taking off regular upper body clothing?: None Help from another person to put on and taking off regular lower body clothing?: None 6 Click Score: 24   End of Session Equipment Utilized During Treatment: Rolling walker (2 wheels) Nurse  Communication: Mobility status  Activity Tolerance: Patient tolerated treatment well Patient left: in bed  OT Visit Diagnosis: Unsteadiness on feet (R26.81);Muscle weakness (generalized) (M62.81)                Time: 9629-5284 OT Time Calculation (min): 21 min Charges:  OT General Charges $OT Visit: 1 Visit OT Evaluation $OT Eval Low Complexity: 1 Low OT Treatments $Self Care/Home Management : 8-22 mins  George Kinder, MS, OTR/L , CBIS ascom [phone]  06/11/23, 12:50 PM

## 2023-06-11 NOTE — Assessment & Plan Note (Signed)
 Likely secondary to sepsis.  Slowly improving -Continue with gentle IV fluid -Monitor renal function -Avoid nephrotoxins

## 2023-06-11 NOTE — Assessment & Plan Note (Addendum)
 Patient with no prior cardiac history except A-fib.  Clinically appears euvolemic with no peripheral edema or JVD. No echo in the system Likely due to bilateral pneumonia and AKI - Echocardiogram ordered - Monitor volume status as patient is getting some IV fluid

## 2023-06-11 NOTE — Progress Notes (Signed)
 Progress Note   Patient: [name] DOB: Jan 11, 1941 DOA: 06/10/2023     1 DOS: the patient was seen and examined on 06/11/2023   Brief hospital course: Taken from H&P.  [name] is a 83 y.o. female with medical history significant of PAF on Eliquis , recently diagnosed C. difficile on vancomycin  p.o., HTN, HLD, mild dementia, sent from her facility for evaluation of new onset of symptoms of cough fever weakness and confusion.   Patient started to have watery diarrhea last Friday, and was tested positive for C. difficile colitis and started on vancomycin  4 times a day p.o.  Since then her diarrhea has been improving, patient reported only 1 episode of loose BM yesterday.   On presentation febrile at 100.9.  Labs came back positive for COVID, rest unremarkable. CTA abdominal pelvis with no intra-abdominal acute abnormality but did have incidental finding of bilateral lower field infiltrates compatible with pneumonia.  Patient was given remdesivir and Unasyn in ED  Patient met criteria for severe sepsis with endorgan damage involving AKI and acute metabolic encephalopathy.  5/21: Afebrile this morning, preliminary blood cultures negative.  Swallow evaluation with no increased risk of aspiration, labs with metabolic acidosis with bicarb at 18, creatinine slowly improving at 1.41, mild leukopenia 2.6. BNP elevated at 880-echocardiogram ordered.  Assessment and Plan: * Sepsis (HCC) COVID-19 pneumonia Patient initially with concern of severe sepsis, endorgan dysfunction with AKI and altered mental status. Acute metabolic encephalopathy likely with sepsis has been improved.  Patient now at baseline Likely due to COVID-19 infection and pneumonia. Currently on room air -Continue with remdesivir to complete a 3-day course  CAP (community acquired pneumonia) Family concern of aspiration, chest imaging with concern of bilateral pneumonia likely due to COVID. Swallow evaluation  with no increased risk of aspiration. - Continue with Unasyn and doxycycline-we will complete a 5-day course. -Continue with supportive care  Metabolic acidosis Bicarb at 18 today, multifactorial with sepsis and AKI On room air -Giving some more IV fluid -Monitor BMP  Elevated brain natriuretic peptide (BNP) level Patient with no prior cardiac history except A-fib.  Clinically appears euvolemic with no peripheral edema or JVD. No echo in the system Likely due to bilateral pneumonia and AKI - Echocardiogram ordered - Monitor volume status as patient is getting some IV fluid  AKI (acute kidney injury) (HCC) Likely secondary to sepsis.  Slowly improving -Continue with gentle IV fluid -Monitor renal function -Avoid nephrotoxins  C. difficile colitis Patient was recently diagnosed with C. difficile colitis and was on p.o. vancomycin . - Continue with p.o. vancomycin  to complete the course  Hypertension Blood pressure mildly elevated. -Restarting home amlodipine  -Continue to home losartan due to AKI  Paroxysmal atrial fibrillation (HCC) Currently in sinus rhythm. - Continue with Multaq  and Eliquis   Physical deconditioning - PT/OT evaluation-recommending home health   Subjective: Patient was sitting comfortably in chair when seen today.  Continues to have some cough.  On room air.  No chest pain or shortness of breath.  Physical Exam: Vitals:   06/10/23 2109 06/11/23 0000 06/11/23 0333 06/11/23 0823  BP:  (!) 148/57 (!) 197/73 (!) 162/59  Pulse:  (!) 52 (!) 49 (!) 58  Resp:  18 16 18   Temp:  97.9 F (36.6 C) 98.7 F (37.1 C) 98.1 F (36.7 C)  TempSrc:  Oral Oral Oral  SpO2:  97% 98% 95%  Weight: 72.6 kg     Height: 5\' 1"  (1.549 m)  General.  Well-developed elderly lady, in no acute distress. Pulmonary.  Lungs clear bilaterally, normal respiratory effort. CV.  Regular rate and rhythm, no JVD, rub or murmur. Abdomen.  Soft, nontender, nondistended, BS  positive. CNS.  Alert and oriented .  No focal neurologic deficit. Extremities.  No edema, no cyanosis, pulses intact and symmetrical. Psychiatry.  Judgment and insight appears normal.   Data Reviewed: Prior data reviewed  Family Communication: Discussed with son at bedside  Disposition: Status is: Inpatient Remains inpatient appropriate because: Severity of illness  Planned Discharge Destination: Home with Home Health  DVT prophylaxis.  Eliquis  Time spent: 50 minutes  This record has been created using Conservation officer, historic buildings. Errors have been sought and corrected,but may not always be located. Such creation errors do not reflect on the standard of care.   Author: Luna Salinas, MD 06/11/2023 2:34 PM  For on call review [email or web address].

## 2023-06-11 NOTE — Assessment & Plan Note (Signed)
 Bicarb at 18 today, multifactorial with sepsis and AKI On room air -Giving some more IV fluid -Monitor BMP

## 2023-06-11 NOTE — Assessment & Plan Note (Signed)
 Currently in sinus rhythm. - Continue with Multaq  and Eliquis 

## 2023-06-11 NOTE — Assessment & Plan Note (Signed)
 Family concern of aspiration, chest imaging with concern of bilateral pneumonia likely due to COVID. Swallow evaluation with no increased risk of aspiration. - Continue with Unasyn and doxycycline-we will complete a 5-day course. -Continue with supportive care

## 2023-06-11 NOTE — Plan of Care (Signed)

## 2023-06-12 ENCOUNTER — Inpatient Hospital Stay (HOSPITAL_COMMUNITY)
Admit: 2023-06-12 | Discharge: 2023-06-12 | Disposition: A | Discharge status: Home / Self Care | Attending: Internal Medicine | Admitting: Internal Medicine

## 2023-06-12 DIAGNOSIS — R9431 Abnormal electrocardiogram [ECG] [EKG]: Secondary | ICD-10-CM

## 2023-06-12 DIAGNOSIS — R7989 Other specified abnormal findings of blood chemistry: Secondary | ICD-10-CM | POA: Diagnosis not present

## 2023-06-12 DIAGNOSIS — J189 Pneumonia, unspecified organism: Secondary | ICD-10-CM | POA: Diagnosis not present

## 2023-06-12 DIAGNOSIS — E872 Acidosis, unspecified: Secondary | ICD-10-CM | POA: Diagnosis not present

## 2023-06-12 DIAGNOSIS — A419 Sepsis, unspecified organism: Secondary | ICD-10-CM | POA: Diagnosis not present

## 2023-06-12 LAB — BASIC METABOLIC PANEL WITH GFR
Anion gap: 7 (ref 5–15)
BUN: 31 mg/dL — ABNORMAL HIGH (ref 8–23)
CO2: 19 mmol/L — ABNORMAL LOW (ref 22–32)
Calcium: 8 mg/dL — ABNORMAL LOW (ref 8.9–10.3)
Chloride: 118 mmol/L — ABNORMAL HIGH (ref 98–111)
Creatinine, Ser: 1.27 mg/dL — ABNORMAL HIGH (ref 0.44–1.00)
GFR, Estimated: 42 mL/min — ABNORMAL LOW (ref >60)
Glucose, Bld: 88 mg/dL (ref 70–99)
Potassium: 3.7 mmol/L (ref 3.5–5.1)
Sodium: 144 mmol/L (ref 135–145)

## 2023-06-12 LAB — CBC
HCT: 30.8 % — ABNORMAL LOW (ref 36.0–46.0)
Hemoglobin: 10.3 g/dL — ABNORMAL LOW (ref 12.0–15.0)
MCH: 29.2 pg (ref 26.0–34.0)
MCHC: 33.4 g/dL (ref 30.0–36.0)
MCV: 87.3 fL (ref 80.0–100.0)
Platelets: 214 10*3/uL (ref 150–400)
RBC: 3.53 MIL/uL — ABNORMAL LOW (ref 3.87–5.11)
RDW: 15 % (ref 11.5–15.5)
WBC: 9.8 10*3/uL (ref 4.0–10.5)
nRBC: 0 % (ref 0.0–0.2)

## 2023-06-12 LAB — ECHOCARDIOGRAM COMPLETE
Area-P 1/2: 4.49 cm2
Calc EF: 52.6 %
Height: 61 in
P 1/2 time: 336 ms
S' Lateral: 3.6 cm
Single Plane A2C EF: 54 %
Single Plane A4C EF: 50 %
Weight: 2560.86 [oz_av]

## 2023-06-12 MED ORDER — LACTATED RINGERS IV SOLN: INTRAVENOUS | Status: DC

## 2023-06-12 NOTE — Progress Notes (Signed)
 Physical Therapy Treatment Patient Details Name: [name] MRN: [id number] DOB: January 04, 1941 Today's Date: 06/12/2023   History of Present Illness [name] is a 83 y.o. female with medical history significant of PAF on Eliquis , recently diagnosed C. difficile on vancomycin  p.o., HTN, HLD, mild dementia, sent from her facility for evaluation of new onset of symptoms of cough fever weakness and confusion.    PT Comments  Pt received supine in bed sleeping. Easily awoken to voice and light touch. Agreeable to session.  Exits bed supervision and stands CGA to RW with VC's for hand placement. Able to complete ~60' of gait in room with improved gait cadence this date. Does need x1 seated rest break for 2-3 minutes due to generalized fatigue. Pt stands and ambulates additional lap in room having a seat in recliner with all needs in reach. Pt making good progress towards goals. Will continue to benefit from regualr treatment due to high risk of acute deconditioning during admission. Pt also continues to have mild confusion but is easily re-oriented. D/c recs remain appropriate.    If plan is discharge home, recommend the following: A little help with walking and/or transfers;A little help with bathing/dressing/bathroom;Assistance with cooking/housework;Assist for transportation;Direct supervision/assist for financial management;Help with stairs or ramp for entrance;Direct supervision/assist for medications management   Can travel by private vehicle        Equipment Recommendations  None recommended by PT    Recommendations for Other Services       Precautions / Restrictions Precautions Precautions: Fall Restrictions Weight Bearing Restrictions Per Provider Order: No     Mobility  Bed Mobility Overal bed mobility: Needs Assistance Bed Mobility: Supine to Sit     Supine to sit: Supervision       Patient Response: Cooperative  Transfers Overall transfer level: Needs  assistance Equipment used: Rolling walker (2 wheels) Transfers: Sit to/from Stand Sit to Stand: Contact guard assist           General transfer comment: VC's for hand placement    Ambulation/Gait Ambulation/Gait assistance: Supervision Gait Distance (Feet): 60 Feet (x1 seated rest break during bout.) Assistive device: Rolling walker (2 wheels) Gait Pattern/deviations: Step-through pattern, Decreased step length - right, Decreased step length - left           Stairs             Wheelchair Mobility     Tilt Bed Tilt Bed Patient Response: Cooperative  Modified Rankin (Stroke Patients Only)       Balance Overall balance assessment: Needs assistance Sitting-balance support: No upper extremity supported, Feet supported, Single extremity supported Sitting balance-Leahy Scale: Good     Standing balance support: Bilateral upper extremity supported, Reliant on assistive device for balance, Single extremity supported, During functional activity Standing balance-Leahy Scale: Fair Standing balance comment: uses RW at baseline                            Communication Communication Communication: No apparent difficulties  Cognition Arousal: Alert Behavior During Therapy: WFL for tasks assessed/performed   PT - Cognitive impairments: Memory                       PT - Cognition Comments: Thinks it is morning time when awoken from nap at around 3:15 p.m. Re-oriented to time. Following commands: Intact      Cueing Cueing Techniques: Verbal cues  Exercises  General Comments        Pertinent Vitals/Pain      Home Living                          Prior Function            PT Goals (current goals can now be found in the care plan section) Acute Rehab PT Goals Patient Stated Goal: improve her strength PT Goal Formulation: With patient Time For Goal Achievement: 06/25/23 Potential to Achieve Goals: Good Progress towards  PT goals: Progressing toward goals    Frequency    Min 2X/week      PT Plan      Co-evaluation              AM-PAC PT "6 Clicks" Mobility   Outcome Measure  Help needed turning from your back to your side while in a flat bed without using bedrails?: A Little Help needed moving from lying on your back to sitting on the side of a flat bed without using bedrails?: A Little Help needed moving to and from a bed to a chair (including a wheelchair)?: A Little Help needed standing up from a chair using your arms (e.g., wheelchair or bedside chair)?: A Little Help needed to walk in hospital room?: A Little Help needed climbing 3-5 steps with a railing? : A Little 6 Click Score: 18    End of Session Equipment Utilized During Treatment: Gait belt Activity Tolerance: Patient tolerated treatment well Patient left: in chair;with call bell/phone within reach;with chair alarm set Nurse Communication: Mobility status PT Visit Diagnosis: Muscle weakness (generalized) (M62.81)     Time: 7829-5621 PT Time Calculation (min) (ACUTE ONLY): 23 min  Charges:    $Therapeutic Activity: 23-37 mins PT General Charges $$ ACUTE PT VISIT: 1 Visit                     Marc Senior. Fairly IV, PT, DPT Physical Therapist- [place]  [facility]  06/12/2023, 3:57 PM

## 2023-06-12 NOTE — Assessment & Plan Note (Signed)
 Family concern of aspiration, chest imaging with concern of bilateral pneumonia likely due to COVID. Swallow evaluation with no increased risk of aspiration. - Continue with Unasyn and doxycycline-we will complete a 5-day course.  Patient can get doxycycline and Augmentin on discharge -Continue with supportive care

## 2023-06-12 NOTE — Progress Notes (Signed)
 Occupational Therapy Treatment Patient Details Name: [name] MRN: [id number] DOB: 07/13/1940 Today's Date: 06/12/2023   History of present illness [name] is a 83 y.o. female with medical history significant of PAF on Eliquis , recently diagnosed C. difficile on vancomycin  p.o., HTN, HLD, mild dementia, sent from her facility for evaluation of new onset of symptoms of cough fever weakness and confusion.   OT comments  Pt seen for OT Tx. Pt agreeable to session and denies complaints. Pt required increased time/effort to sit EOB, CGA to stand with RW, and CGA for ADL mobility in room and bathing. UE vs BUE forearm support on counter while brushing her teeth. Endorsed mild fatigue but requesting to use the bathroom. CGA for toilet transfer and clothing mgt. Pt indep with seated pericare. Once seated in recliner, pt endorsing significantly worn out. VSS. Pt progressing however continues to be limited by activity tolerance. DC recommendation updated to optimize pt's return to PLOF.       If plan is discharge home, recommend the following:  Assistance with cooking/housework;Assist for transportation;Help with stairs or ramp for entrance;A little help with bathing/dressing/bathroom;Direct supervision/assist for medications management   Equipment Recommendations  None recommended by OT    Recommendations for Other Services      Precautions / Restrictions Precautions Precautions: Fall Restrictions Weight Bearing Restrictions Per Provider Order: No       Mobility Bed Mobility Overal bed mobility: Needs Assistance Bed Mobility: Supine to Sit     Supine to sit: Supervision, HOB elevated          Transfers Overall transfer level: Needs assistance Equipment used: Rolling walker (2 wheels) Transfers: Sit to/from Stand Sit to Stand: Contact guard assist                 Balance Overall balance assessment: Needs assistance Sitting-balance support: No upper extremity  supported, Feet supported, Single extremity supported Sitting balance-Leahy Scale: Good     Standing balance support: Bilateral upper extremity supported, Reliant on assistive device for balance, Single extremity supported, During functional activity Standing balance-Leahy Scale: Fair                             ADL either performed or assessed with clinical judgement   ADL Overall ADL's : Needs assistance/impaired     Grooming: Standing;Supervision/safety;Wash/dry hands;Wash/dry face;Oral care Grooming Details (indicate cue type and reason): UE versus BUE forearm support on counter                 Toilet Transfer: Supervision/safety;Rolling walker (2 wheels)   Toileting- Clothing Manipulation and Hygiene: Supervision/safety;Sit to/from stand       Functional mobility during ADLs: Supervision/safety;Rolling walker (2 wheels)      Extremity/Trunk Assessment              Vision       Perception     Praxis     Communication Communication Communication: No apparent difficulties   Cognition Arousal: Alert Behavior During Therapy: WFL for tasks assessed/performed Cognition: No apparent impairments                               Following commands: Intact        Cueing   Cueing Techniques: Verbal cues  Exercises Other Exercises Other Exercises: Pt educated in incentive spirometer use    Shoulder Instructions  General Comments      Pertinent Vitals/ Pain       Pain Assessment Pain Assessment: No/denies pain  Home Living                                          Prior Functioning/Environment              Frequency  Min 1X/week        Progress Toward Goals  OT Goals(current goals can now be found in the care plan section)  Progress towards OT goals: Progressing toward goals  Acute Rehab OT Goals Patient Stated Goal: go home OT Goal Formulation: With patient Time For Goal Achievement:  06/25/23 Potential to Achieve Goals: Good  Plan      Co-evaluation                 AM-PAC OT "6 Clicks" Daily Activity     Outcome Measure   Help from another person eating meals?: None Help from another person taking care of personal grooming?: None Help from another person toileting, which includes using toliet, bedpan, or urinal?: A Little Help from another person bathing (including washing, rinsing, drying)?: A Little Help from another person to put on and taking off regular upper body clothing?: None Help from another person to put on and taking off regular lower body clothing?: A Little 6 Click Score: 21    End of Session Equipment Utilized During Treatment: Rolling walker (2 wheels)  OT Visit Diagnosis: Unsteadiness on feet (R26.81);Muscle weakness (generalized) (M62.81)   Activity Tolerance Patient tolerated treatment well   Patient Left in chair;with call bell/phone within reach;with chair alarm set   Nurse Communication          Time: 1610-9604 OT Time Calculation (min): 28 min  Charges: OT General Charges $OT Visit: 1 Visit OT Treatments $Self Care/Home Management : 23-37 mins  Berenda Breaker., MPH, MS, OTR/L ascom [phone] 06/12/23, 12:25 PM

## 2023-06-12 NOTE — Progress Notes (Signed)
 Patients blood pressure running high throughout the evening PRN Hydralazine  5 mg intervenous given  X 2 as ordered. No C/O headache or being light headed. Slept well through the evening. Bed in low position, call light within reach.

## 2023-06-12 NOTE — Plan of Care (Signed)

## 2023-06-12 NOTE — Assessment & Plan Note (Signed)
 Likely secondary to sepsis.  Slowly improving -Continue with gentle IV fluid -Monitor renal function -Avoid nephrotoxins

## 2023-06-12 NOTE — Assessment & Plan Note (Signed)
 Bicarb at 19 today, multifactorial with sepsis and AKI On room air -Giving some more IV fluid -Monitor BMP

## 2023-06-12 NOTE — Assessment & Plan Note (Signed)
 Patient with no prior cardiac history except A-fib.  Clinically appears euvolemic with no peripheral edema or JVD. No echo in the system Likely due to bilateral pneumonia and AKI - Echocardiogram ordered-still pending - Monitor volume status as patient is getting some IV fluid

## 2023-06-12 NOTE — Progress Notes (Signed)
 Mobility Specialist - Progress Note     06/12/23 1632  Mobility  Activity Stood at bedside;Ambulated with assistance in room  Level of Assistance Standby assist, set-up cues, supervision of patient - no hands on  Assistive Device Front wheel walker  Distance Ambulated (ft) 60 ft  Range of Motion/Exercises Active  Activity Response Tolerated well  Mobility Referral Yes  Mobility visit 1 Mobility  Mobility Specialist Start Time (ACUTE ONLY) 1610  Mobility Specialist Stop Time (ACUTE ONLY) 1634  Mobility Specialist Time Calculation (min) (ACUTE ONLY) 24 min   Pt resting in recliner on RA upon entry. Pt STS and ambulates around room SBA with RW. Pt returned to recliner and performed x2 15 second knee press ups and 2x10  ankle rotations. Pt left in recliner with needs in reach and chair alarm activated.   Jerri Morale Mobility Specialist 06/12/23, 4:36 PM

## 2023-06-12 NOTE — Plan of Care (Signed)
°  Problem: Health Behavior/Discharge Planning: °Goal: Ability to manage health-related needs will improve °Outcome: Progressing °  °Problem: Clinical Measurements: °Goal: Ability to maintain clinical measurements within normal limits will improve °Outcome: Progressing °  °Problem: Activity: °Goal: Risk for activity intolerance will decrease °Outcome: Progressing °  °Problem: Nutrition: °Goal: Adequate nutrition will be maintained °Outcome: Progressing °  °Problem: Coping: °Goal: Level of anxiety will decrease °Outcome: Progressing °  °Problem: Elimination: °Goal: Will not experience complications related to bowel motility °Outcome: Progressing °  °

## 2023-06-12 NOTE — Assessment & Plan Note (Signed)
 COVID-19 pneumonia Patient initially with concern of severe sepsis, endorgan dysfunction with AKI and altered mental status. Acute metabolic encephalopathy likely with sepsis has been improved.  Patient now at baseline Likely due to COVID-19 infection and pneumonia. Currently on room air -Continue with remdesivir to complete a 3-day course

## 2023-06-12 NOTE — Progress Notes (Signed)
  Echocardiogram 2D Echocardiogram has been performed.  [name] 06/12/2023, 3:06 PM

## 2023-06-12 NOTE — Progress Notes (Signed)
 Progress Note   Patient: [name] ZOX:[id number] DOB: 09/18/1940 DOA: 06/10/2023     2 DOS: the patient was seen and examined on 06/12/2023   Brief hospital course: Taken from H&P.  [name] is a 83 y.o. female with medical history significant of PAF on Eliquis , recently diagnosed C. difficile on vancomycin  p.o., HTN, HLD, mild dementia, sent from her facility for evaluation of new onset of symptoms of cough fever weakness and confusion.   Patient started to have watery diarrhea last Friday, and was tested positive for C. difficile colitis and started on vancomycin  4 times a day p.o.  Since then her diarrhea has been improving, patient reported only 1 episode of loose BM yesterday.   On presentation febrile at 100.9.  Labs came back positive for COVID, rest unremarkable. CTA abdominal pelvis with no intra-abdominal acute abnormality but did have incidental finding of bilateral lower field infiltrates compatible with pneumonia.  Patient was given remdesivir and Unasyn in ED  Patient met criteria for severe sepsis with endorgan damage involving AKI and acute metabolic encephalopathy.  5/21: Afebrile this morning, preliminary blood cultures negative.  Swallow evaluation with no increased risk of aspiration, labs with metabolic acidosis with bicarb at 18, creatinine slowly improving at 1.41, mild leukopenia 2.6. BNP elevated at 880-echocardiogram ordered.  5/22: Hemodynamically stable, labs with small improvement in bicarb 19 but improving creatinine now at 1.27, overnight calling son and concern of hallucination which is common at this age group group likely hospital delirium with acute illness.  Echocardiogram pending  5/23: Cough with mildly bloodstained sputum-still pending echocardiogram.  Bicarb still at 19 so giving some gentle IV fluid.  PT is recommending home health  Assessment and Plan: * Sepsis (HCC) COVID-19 pneumonia Patient initially with concern of severe sepsis,  endorgan dysfunction with AKI and altered mental status. Acute metabolic encephalopathy likely with sepsis has been improved.  Patient now at baseline Likely due to COVID-19 infection and pneumonia. Currently on room air -Continue with remdesivir to complete a 3-day course  CAP (community acquired pneumonia) Family concern of aspiration, chest imaging with concern of bilateral pneumonia likely due to COVID. Swallow evaluation with no increased risk of aspiration. - Continue with Unasyn and doxycycline-we will complete a 5-day course.  Patient can get doxycycline and Augmentin on discharge -Continue with supportive care  Metabolic acidosis Bicarb at 19 today, multifactorial with sepsis and AKI On room air -Giving some more IV fluid -Monitor BMP  Elevated brain natriuretic peptide (BNP) level Patient with no prior cardiac history except A-fib.  Clinically appears euvolemic with no peripheral edema or JVD. No echo in the system Likely due to bilateral pneumonia and AKI - Echocardiogram ordered-still pending - Monitor volume status as patient is getting some IV fluid  AKI (acute kidney injury) (HCC) Likely secondary to sepsis.  Slowly improving -Continue with gentle IV fluid -Monitor renal function -Avoid nephrotoxins  C. difficile colitis Patient was recently diagnosed with C. difficile colitis and was on p.o. vancomycin . - Continue with p.o. vancomycin  to complete the course  Hypertension Blood pressure mildly elevated. -Restarting home amlodipine  -Continue to home losartan due to AKI  Paroxysmal atrial fibrillation (HCC) Currently in sinus rhythm. - Continue with Multaq  and Eliquis   Physical deconditioning - PT/OT evaluation-recommending home health   Subjective: Patient continued to have cough and coughed out some blood-tinged sputum.  No shortness of breath.  Physical Exam: Vitals:   06/11/23 2300 06/12/23 0304 06/12/23 0600 06/12/23 0745  BP: Aaron Aas)  160/58 (!)  181/58 (!) 154/55 (!) 152/62  Pulse:  61 61 63  Resp:  16 16 18   Temp:  97.7 F (36.5 C)  98.2 F (36.8 C)  TempSrc:  Oral    SpO2:  98%  98%  Weight:      Height:       General.  Well-developed elderly lady, in no acute distress. Pulmonary.  Lungs clear bilaterally, normal respiratory effort. CV.  Regular rate and rhythm, no JVD, rub or murmur. Abdomen.  Soft, nontender, nondistended, BS positive. CNS.  Alert and oriented .  No focal neurologic deficit. Extremities.  No edema, no cyanosis, pulses intact and symmetrical. Psychiatry.  Judgment and insight appears normal.   Data Reviewed: Prior data reviewed  Family Communication: Discussed with son at bedside  Disposition: Status is: Inpatient Remains inpatient appropriate because: Severity of illness  Planned Discharge Destination: Home with Home Health  DVT prophylaxis.  Eliquis  Time spent: 50 minutes  This record has been created using Conservation officer, historic buildings. Errors have been sought and corrected,but may not always be located. Such creation errors do not reflect on the standard of care.   Author: Luna Salinas, MD 06/12/2023 3:30 PM  For on call review [email or web address].

## 2023-06-13 DIAGNOSIS — U071 COVID-19: Secondary | ICD-10-CM

## 2023-06-13 DIAGNOSIS — A419 Sepsis, unspecified organism: Secondary | ICD-10-CM | POA: Diagnosis not present

## 2023-06-13 DIAGNOSIS — I480 Paroxysmal atrial fibrillation: Secondary | ICD-10-CM | POA: Diagnosis not present

## 2023-06-13 DIAGNOSIS — A0472 Enterocolitis due to Clostridium difficile, not specified as recurrent: Secondary | ICD-10-CM | POA: Diagnosis not present

## 2023-06-13 DIAGNOSIS — E872 Acidosis, unspecified: Secondary | ICD-10-CM | POA: Diagnosis not present

## 2023-06-13 LAB — BASIC METABOLIC PANEL WITH GFR
Anion gap: 6 (ref 5–15)
BUN: 24 mg/dL — ABNORMAL HIGH (ref 8–23)
CO2: 19 mmol/L — ABNORMAL LOW (ref 22–32)
Calcium: 7.8 mg/dL — ABNORMAL LOW (ref 8.9–10.3)
Chloride: 120 mmol/L — ABNORMAL HIGH (ref 98–111)
Creatinine, Ser: 1.26 mg/dL — ABNORMAL HIGH (ref 0.44–1.00)
GFR, Estimated: 42 mL/min — ABNORMAL LOW (ref >60)
Glucose, Bld: 91 mg/dL (ref 70–99)
Potassium: 3.5 mmol/L (ref 3.5–5.1)
Sodium: 145 mmol/L (ref 135–145)

## 2023-06-13 LAB — CBC
HCT: 28.4 % — ABNORMAL LOW (ref 36.0–46.0)
Hemoglobin: 9.3 g/dL — ABNORMAL LOW (ref 12.0–15.0)
MCH: 29.2 pg (ref 26.0–34.0)
MCHC: 32.7 g/dL (ref 30.0–36.0)
MCV: 89.3 fL (ref 80.0–100.0)
Platelets: 203 10*3/uL (ref 150–400)
RBC: 3.18 MIL/uL — ABNORMAL LOW (ref 3.87–5.11)
RDW: 15.3 % (ref 11.5–15.5)
WBC: 5.6 10*3/uL (ref 4.0–10.5)
nRBC: 0 % (ref 0.0–0.2)

## 2023-06-13 MED ORDER — AMOXICILLIN-POT CLAVULANATE 875-125 MG PO TABS: 1.0000 | ORAL_TABLET | Freq: Two times a day (BID) | ORAL | 0 refills | Status: DC

## 2023-06-13 MED ORDER — DOXYCYCLINE HYCLATE 100 MG PO TABS: 100.0000 mg | ORAL_TABLET | Freq: Two times a day (BID) | ORAL | 0 refills | Status: AC

## 2023-06-13 MED ORDER — RISAQUAD PO CAPS: 1.0000 | ORAL_CAPSULE | Freq: Three times a day (TID) | ORAL | 0 refills | Status: DC

## 2023-06-13 MED ORDER — SODIUM BICARBONATE 8.4 % IV SOLN
50.0000 meq | Freq: Once | INTRAVENOUS | Status: AC
  Administered 2023-06-13: 50 meq via INTRAVENOUS
  Filled 2023-06-13: qty 50

## 2023-06-13 MED ORDER — AMOXICILLIN-POT CLAVULANATE 875-125 MG PO TABS: 1.0000 | ORAL_TABLET | Freq: Two times a day (BID) | ORAL | Status: DC

## 2023-06-13 MED ORDER — RISAQUAD PO CAPS: 1.0000 | ORAL_CAPSULE | Freq: Three times a day (TID) | ORAL | 0 refills | Status: AC

## 2023-06-13 MED ORDER — DOXYCYCLINE HYCLATE 100 MG PO TABS: 100.0000 mg | ORAL_TABLET | Freq: Two times a day (BID) | ORAL | 0 refills | Status: DC

## 2023-06-13 MED ORDER — SODIUM CHLORIDE 0.45 % IV SOLN
INTRAVENOUS | Status: DC
Start: 2023-06-13 — End: 2023-06-13
  Filled 2023-06-13: qty 75

## 2023-06-13 MED ORDER — DOXYCYCLINE HYCLATE 100 MG PO TABS
100.0000 mg | ORAL_TABLET | Freq: Two times a day (BID) | ORAL | Status: DC
  Administered 2023-06-13: 100 mg via ORAL
  Filled 2023-06-13: qty 1

## 2023-06-13 MED ORDER — AMOXICILLIN-POT CLAVULANATE 875-125 MG PO TABS: 1.0000 | ORAL_TABLET | Freq: Two times a day (BID) | ORAL | 0 refills | Status: AC

## 2023-06-13 NOTE — NC FL2 (Signed)
 [place]  MEDICAID FL2 LEVEL OF CARE FORM     IDENTIFICATION  Patient Name: [name] Birthdate: September 17, 1940 Sex: female Admission Date (Current Location): 06/10/2023  [facility] and [place] Number:  Chiropodist and Address:  [facility], [address] [place], [place] [address]      Provider Number: [id number]  Attending Physician Name and Address:  Melvinia Stager, MD  Relative Name and Phone Number:  Reta Norgren, son, phone: [phone] and Basilia Lima, niece, phone: [phone]    Current Level of Care: Hospital Recommended Level of Care: Assisted Living Facility Prior Approval Number:    Date Approved/Denied: 12/06/13 PASRR Number: [id number]  Discharge Plan: Other (Comment) (Return to ALF)    Current Diagnoses: Patient Active Problem List   Diagnosis Date Noted   COVID-19 06/13/2023   C. 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   Chronic pain of right knee 11/20/2021   Chronic pain syndrome 11/20/2021   History of lumbar laminectomy for spinal cord decompression (L3-L5) Dr Jeris Montes 2022 11/20/2021   S/P revision of total knee, 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    Orientation RESPIRATION BLADDER Height & Weight     Self, Time, Place  Normal Continent Weight: 72.6 kg Height:  5\' 1"  (154.9 cm)  BEHAVIORAL SYMPTOMS/MOOD NEUROLOGICAL BOWEL NUTRITION STATUS      Continent Diet (Please see discharge summary)  AMBULATORY STATUS COMMUNICATION OF NEEDS Skin   Supervision Verbally  (Dry,  intact, non-tenting)                       Personal Care Assistance Level of Assistance  Bathing, Feeding, Dressing, Total care Bathing Assistance: Limited assistance Feeding assistance: Limited assistance Dressing Assistance: Limited assistance Total Care Assistance: Limited assistance   Functional Limitations Info  Sight, Hearing Sight Info: Impaired Hearing Info: Impaired      SPECIAL CARE FACTORS FREQUENCY                       Contractures      Additional Factors Info  Code Status, Allergies Code Status Info: DNR Allergies Info: Aspirin    Received from outside source  High - Other (See Comments) Comments  Nsaids  High - Other (See Comments) Comments  Methocarbamol  Medium - Other (See Comments) Comments  Morphine  And Codeine    Received from outside source  No severity specified - Other (See Comments) Comments  Morphine  And Related    Received from outside source  No severity or reactions specified  Oxycodone    Received from outside source  No severity specified - Other (See Comments)           Current Medications (06/13/2023):  This is the current hospital active medication list Current Facility-Administered Medications  Medication Dose Route Frequency Provider Last Rate Last Admin   acetaminophen  (TYLENOL ) tablet 650 mg  650 mg Oral Q6H PRN Antoniette Batty T, MD   650 mg at 06/12/23 2115   acidophilus (RISAQUAD) capsule 1 capsule  1 capsule Oral TID [place],  MD   1 capsule at 06/13/23 1001   amLODipine  (NORVASC ) tablet 5 mg  5 mg Oral Daily Amin, Sumayya, MD   5 mg at 06/13/23 1000   amoxicillin-clavulanate (AUGMENTIN) 875-125 MG per tablet 1 tablet  1 tablet Oral Q12H Patel, Sona, MD       apixaban  (ELIQUIS ) tablet 2.5 mg  2.5 mg Oral BID Amin, Sumayya, MD   2.5 mg at 06/13/23 1001   azelastine (ASTELIN) 0.1 % nasal spray 2 spray  2 spray Each Nare BID Antoniette Batty T, MD   2 spray at 06/13/23 1006   doxycycline (VIBRA-TABS) tablet 100 mg  100 mg Oral Q12H  Patel, Sona, MD   100 mg at 06/13/23 1217   dronedarone  (MULTAQ ) tablet 400 mg  400 mg Oral BID WC Zhang, Ping T, MD   400 mg at 06/13/23 0865   escitalopram  (LEXAPRO ) tablet 10 mg  10 mg Oral QHS Antoniette Batty T, MD   10 mg at 06/12/23 2116   famotidine (PEPCID) tablet 20 mg  20 mg Oral Daily Antoniette Batty T, MD   20 mg at 06/13/23 0956   guaiFENesin (MUCINEX) 12 hr tablet 1,200 mg  1,200 mg Oral BID Antoniette Batty T, MD   1,200 mg at 06/13/23 1001   hydrALAZINE  (APRESOLINE ) injection 5 mg  5 mg Intravenous Q6H PRN Antoniette Batty T, MD   5 mg at 06/13/23 0441   ipratropium-albuterol (DUONEB) 0.5-2.5 (3) MG/3ML nebulizer solution 3 mL  3 mL Nebulization Q6H PRN Antoniette Batty T, MD       ondansetron  (ZOFRAN ) tablet 4 mg  4 mg Oral Q6H PRN Antoniette Batty T, MD       Or   ondansetron  (ZOFRAN ) injection 4 mg  4 mg Intravenous Q6H PRN Antoniette Batty T, MD       pantoprazole  (PROTONIX ) EC tablet 40 mg  40 mg Oral Daily Zhang, Ping T, MD   40 mg at 06/13/23 1001   pravastatin  (PRAVACHOL ) tablet 40 mg  40 mg Oral QHS Antoniette Batty T, MD   40 mg at 06/12/23 2116   pregabalin  (LYRICA ) capsule 50 mg  50 mg Oral BID Zhang, Ping T, MD   50 mg at 06/13/23 1000   traMADol  (ULTRAM ) tablet 50 mg  50 mg Oral Q12H Antoniette Batty T, MD   50 mg at 06/13/23 7846   vancomycin  (VANCOCIN ) capsule 125 mg  125 mg Oral QID Patel, Sona, MD   125 mg at 06/13/23 1001     Discharge Medications: Please see discharge summary for a list of discharge medications.  Relevant Imaging Results:  Relevant Lab Results:   Additional Information SSN: [id number]  Crayton Docker, RN

## 2023-06-13 NOTE — Plan of Care (Signed)
  Problem: Respiratory: Goal: Will maintain a patent airway Outcome: Progressing   Problem: Clinical Measurements: Goal: Ability to maintain clinical measurements within normal limits will improve Outcome: Progressing   Problem: Activity: Goal: Risk for activity intolerance will decrease Outcome: Progressing   Problem: Nutrition: Goal: Adequate nutrition will be maintained Outcome: Progressing   Problem: Coping: Goal: Level of anxiety will decrease Outcome: Progressing   Problem: Elimination: Goal: Will not experience complications related to bowel motility Outcome: Progressing

## 2023-06-13 NOTE — Care Management Important Message (Signed)
 Important Message  Patient Details  Name: [name] MRN: [id number] Date of Birth: 06/16/1940   Important Message Given:  Yes - Medicare IM     Calle Schader W, CMA 06/13/2023, 12:15 PM

## 2023-06-13 NOTE — Progress Notes (Signed)
 Patient report given to [facility] All questions are answered via phone. No any questions or concerns at this time. Peripheral I/v removed.

## 2023-06-13 NOTE — Discharge Summary (Addendum)
 Physician Discharge Summary   Patient: [name] MRN: [id number] DOB: 26-Apr-1940  Admit date:     06/10/2023  Discharge date: 06/13/23  Discharge Physician: Melvinia Stager   PCP: Delmus Ferri, MD   Recommendations at discharge:   follow-up PCP in 1 to 2 weeks. Check metabolic panel on follow-up  Discharge Diagnoses: Principal Problem:   Sepsis (HCC) Active Problems:   CAP (community acquired pneumonia)   Metabolic acidosis   Elevated brain natriuretic peptide (BNP) level   AKI (acute kidney injury) (HCC)   C. difficile colitis   Hypertension   Paroxysmal atrial fibrillation [facility])   Physical deconditioning   COVID-19  [name] is a 83 y.o. female with medical history significant of PAF on Eliquis , recently diagnosed C. difficile on vancomycin  p.o., HTN, HLD, mild dementia, sent from her facility for evaluation of new onset of symptoms of cough fever weakness and confusion.    Patient started to have watery diarrhea last Friday, and was tested positive for C. difficile colitis and started on vancomycin  4 times a day p.o.  Since then her diarrhea has been improving, patient reported only 1 episode of loose BM yesterday.   Sepsis (HCC) COVID-19 pneumonia --Patient initially with concern of severe sepsis, endorgan dysfunction with AKI and altered mental status. --Acute metabolic encephalopathy likely with sepsis has been improved.  Patient now at baseline --Likely due to COVID-19 infection and pneumonia. --Currently on room air -Completed remdesivir  a 3-day course -- mentation improved. Patient is feeling overall better. Requesting to go back to [facility]. Discussed with son [name] and he is in agreement   CAP (community acquired pneumonia) Family concern of aspiration, chest imaging with concern of bilateral pneumonia likely due to COVID. Swallow evaluation with no increased risk of aspiration. - Continue with Unasyn and doxycycline-we will complete a 5-day  course.  Patient will get  doxycycline and Augmentin on discharge -Continue with supportive care   Metabolic acidosis Bicarb at 19 today, multifactorial with sepsis and AKI On room air -Giving some more IV fluid -will give one ampule of IV push HCO3. Patient tolerating PO diet. Diarrhea resolved hopefully labs will get better. Will get metabolic panel checked as outpatient with PCP   Elevated brain natriuretic peptide (BNP) level Patient with no prior cardiac history except A-fib.  Clinically appears euvolemic with no peripheral edema or JVD. No echo in the system Likely due to bilateral pneumonia and AKI - Echocardiogram EF reported 45 to 50%. Patient asymptomatic. Will have PCP follow as outpatient and consider cardiology consultation as needed  --stable   AKI (acute kidney injury) (HCC) Likely secondary to sepsis.  Slowly improving received IV fluid -Monitor renal function-- stable and slowly improving -Avoid nephrotoxins   C. difficile colitis Patient was recently diagnosed with C. difficile colitis and was on p.o. vancomycin . - Continue with p.o. vancomycin  to complete the course --will d/c Prevacid for now   Hypertension Blood pressure mildly elevated. -Restarting home amlodipine  and low-dose losartan now since creatinine improving  Paroxysmal atrial fibrillation (HCC) Currently in sinus rhythm. - Continue with Multaq  and Eliquis    Physical deconditioning - PT/OT evaluation-recommending home health  Will discharge back to [facility] with home health PT. Patient son [name] aware.     Pain control - [place]  Controlled Substance Reporting System database was reviewed. and patient was instructed, not to drive, operate heavy machinery, perform activities at heights, swimming or participation in water activities or provide baby-sitting services while on  Pain, Sleep and Anxiety Medications; until their outpatient Physician has advised to do so again. Also recommended  to not to take more than prescribed Pain, Sleep and Anxiety Medications.  Disposition: Assisted living Diet recommendation:  Discharge Diet Orders (From admission, onward)     Start     Ordered   06/13/23 0000  Diet - low sodium heart healthy        06/13/23 1203           Cardiac diet DISCHARGE MEDICATION: Allergies as of 06/13/2023       Reactions   Aspirin    Causes bleeding in stomach   Nsaids Other (See Comments)   Bleeding in stomach   Methocarbamol Other (See Comments)   Hallucinations   Morphine  And Codeine Other (See Comments)   Disoriented hallucinations   Oxycodone Other (See Comments)   Disoriented Hallucinations        Medication List     STOP taking these medications    omeprazole 20 MG capsule Commonly known as: PRILOSEC       TAKE these medications    acetaminophen  500 MG tablet Commonly known as: TYLENOL  Take 2 tablets (1,000 mg total) by mouth every 6 (six) hours as needed for mild pain (pain score 1-3 or temp > 100.5). What changed:  how much to take reasons to take this   acidophilus Caps capsule Take 1 capsule by mouth 3 (three) times daily for 5 days.   amLODipine  2.5 MG tablet Commonly known as: NORVASC  Take 2.5 mg by mouth daily. What changed: Another medication with the same name was removed. Continue taking this medication, and follow the directions you see here.   amoxicillin-clavulanate 875-125 MG tablet Commonly known as: AUGMENTIN Take 1 tablet by mouth every 12 (twelve) hours for 3 doses.   azelastine 0.1 % nasal spray Commonly known as: ASTELIN Place 2 sprays into both nostrils 2 (two) times daily. Use in each nostril as directed   carboxymethylcellulose 0.5 % Soln Commonly known as: REFRESH PLUS Place 1 drop into both eyes daily as needed (dry eyes).   doxycycline 100 MG tablet Commonly known as: VIBRA-TABS Take 1 tablet (100 mg total) by mouth every 12 (twelve) hours for 6 doses.   dronedarone  400 MG  tablet Commonly known as: MULTAQ  Take 400 mg by mouth 2 (two) times daily with a meal.   Eliquis  2.5 MG Tabs tablet Generic drug: apixaban  Take 2.5 mg by mouth 2 (two) times daily. What changed: Another medication with the same name was removed. Continue taking this medication, and follow the directions you see here.   escitalopram  10 MG tablet Commonly known as: LEXAPRO  Take 10 mg by mouth at bedtime.   estradiol  0.5 MG tablet Commonly known as: ESTRACE  Take 0.25 mg by mouth daily. For 30 days What changed: Another medication with the same name was removed. Continue taking this medication, and follow the directions you see here.   famotidine 20 MG tablet Commonly known as: PEPCID Take 20 mg by mouth daily.   losartan 25 MG tablet Commonly known as: COZAAR Take 25 mg by mouth daily.   pravastatin  40 MG tablet Commonly known as: PRAVACHOL  Take 40 mg by mouth at bedtime.   pregabalin  50 MG capsule Commonly known as: Lyrica  Take 1 capsule (50 mg total) by mouth 2 (two) times daily.   traMADol  100 MG 24 hr tablet Commonly known as: ULTRAM -ER Take 1 tablet (100 mg total) by mouth daily.   vancomycin  125 MG  capsule Commonly known as: VANCOCIN  Take 125 mg by mouth 4 (four) times daily.   Vitamin D  50 MCG (2000 UT) Caps Take 2,000 Units by mouth in the morning.        Follow-up Information     Delmus Ferri, MD. Go on 06/19/2023.   Specialty: Physician Assistant Why: @10 :45am  Hospital follow-up and check metabolic panel as outpatient Contact information: [address] [place] [address] [phone]                Discharge Exam: Cleavon Curls Weights   06/10/23 2109  Weight: 72.6 kg   Alert and oriented times three respiratory distant breath sounds. No respiratory distress. Patient on room air. No wheezing cardiovascular both heart sounds normal no murmur neuro- grossly intact. Patient appears alert and oriented skin  warm and dry  Condition at discharge: fair  The results of significant diagnostics from this hospitalization (including imaging, microbiology, ancillary and laboratory) are listed below for reference.   Imaging Studies: ECHOCARDIOGRAM COMPLETE Result Date: 06/12/2023    ECHOCARDIOGRAM REPORT   Patient Name:   BRAYLEY MACKOWIAK Date of Exam: 06/12/2023 Medical Rec #:  [id number]     Height:       61.0 in Accession #:    [id number]    Weight:       160.1 lb Date of Birth:  [birth year]     BSA:          1.718 m Patient Age:    83 years      BP:           152/62 mmHg Patient Gender: F             HR:           66 bpm. Exam Location:  [facility] Procedure: 2D Echo (Both Spectral and Color Flow Doppler were utilized during            procedure). Indications:     abnormal ecg  History:         Patient has no prior history of Echocardiogram examinations.                  Covid, Arrythmias:Paroxysmal A-fib; Risk Factors:Hypertension                  and Dyslipidemia.  Sonographer:     Dione Franks RDCS Referring Phys:  [id number] WUXLKGM AMIN Diagnosing Phys: Constancia Delton MD IMPRESSIONS  1. Left ventricular ejection fraction, by estimation, is 45 to 50%. The left ventricle has mildly decreased function. The left ventricle demonstrates global hypokinesis. Left ventricular diastolic parameters are indeterminate.  2. Right ventricular systolic function is normal. The right ventricular size is normal. There is moderately elevated pulmonary artery systolic pressure. The estimated right ventricular systolic pressure is 59.2 mmHg.  3. Left atrial size was mildly dilated.  4. The mitral valve is normal in structure. Mild mitral valve regurgitation.  5. Tricuspid valve regurgitation is mild to moderate.  6. The aortic valve is tricuspid. Aortic valve regurgitation is mild. Aortic valve sclerosis is present, with no evidence of aortic valve stenosis.  7. The inferior vena cava is normal in size with greater than 50% respiratory  variability, suggesting right atrial pressure of 3 mmHg. FINDINGS  Left Ventricle: Left ventricular ejection fraction, by estimation, is 45 to 50%. The left ventricle has mildly decreased function. The left ventricle demonstrates global hypokinesis. The left ventricular internal cavity size was normal in  size. There is  no left ventricular hypertrophy. Left ventricular diastolic parameters are indeterminate. Right Ventricle: The right ventricular size is normal. No increase in right ventricular wall thickness. Right ventricular systolic function is normal. There is moderately elevated pulmonary artery systolic pressure. The tricuspid regurgitant velocity is 3.75 m/s, and with an assumed right atrial pressure of 3 mmHg, the estimated right ventricular systolic pressure is 59.2 mmHg. Left Atrium: Left atrial size was mildly dilated. Right Atrium: Right atrial size was normal in size. Pericardium: There is no evidence of pericardial effusion. Mitral Valve: The mitral valve is normal in structure. Mild mitral valve regurgitation. Tricuspid Valve: The tricuspid valve is normal in structure. Tricuspid valve regurgitation is mild to moderate. Aortic Valve: The aortic valve is tricuspid. Aortic valve regurgitation is mild. Aortic regurgitation PHT measures 336 msec. Aortic valve sclerosis is present, with no evidence of aortic valve stenosis. Pulmonic Valve: The pulmonic valve was not well visualized. Pulmonic valve regurgitation is not visualized. Aorta: The aortic root and ascending aorta are structurally normal, with no evidence of dilitation. Venous: The inferior vena cava is normal in size with greater than 50% respiratory variability, suggesting right atrial pressure of 3 mmHg. IAS/Shunts: No atrial level shunt detected by color flow Doppler.  LEFT VENTRICLE PLAX 2D LVIDd:         4.60 cm      Diastology LVIDs:         3.60 cm      LV e' medial:    7.07 cm/s LV PW:         1.20 cm      LV E/e' medial:  19.1 LV IVS:         0.90 cm      LV e' lateral:   10.70 cm/s LVOT diam:     1.70 cm      LV E/e' lateral: 12.6 LV SV:         54 LV SV Index:   31 LVOT Area:     2.27 cm  LV Volumes (MOD) LV vol d, MOD A2C: 101.0 ml LV vol d, MOD A4C: 100.0 ml LV vol s, MOD A2C: 46.5 ml LV vol s, MOD A4C: 50.0 ml LV SV MOD A2C:     54.5 ml LV SV MOD A4C:     100.0 ml LV SV MOD BP:      54.3 ml RIGHT VENTRICLE             IVC RV Basal diam:  2.60 cm     IVC diam: 1.90 cm RV S prime:     10.30 cm/s TAPSE (M-mode): 2.4 cm LEFT ATRIUM             Index        RIGHT ATRIUM           Index LA diam:        4.80 cm 2.79 cm/m   RA Area:     14.20 cm LA Vol (A2C):   65.1 ml 37.89 ml/m  RA Volume:   34.10 ml  19.85 ml/m LA Vol (A4C):   59.2 ml 34.46 ml/m LA Biplane Vol: 66.0 ml 38.41 ml/m  AORTIC VALVE LVOT Vmax:   101.00 cm/s LVOT Vmean:  65.900 cm/s LVOT VTI:    0.238 m AI PHT:      336 msec  AORTA Ao Root diam: 2.40 cm Ao Asc diam:  3.10 cm MITRAL VALVE  TRICUSPID VALVE MV Area (PHT): 4.49 cm     TR Peak grad:   56.2 mmHg MV Decel Time: 169 msec     TR Vmax:        375.00 cm/s MV E velocity: 135.00 cm/s MV A velocity: 112.00 cm/s  SHUNTS MV E/A ratio:  1.21         Systemic VTI:  0.24 m                             Systemic Diam: 1.70 cm Constancia Delton MD Electronically signed by Constancia Delton MD Signature Date/Time: 06/12/2023/4:37:19 PM    Final    CT ABDOMEN PELVIS WO CONTRAST Result Date: 06/10/2023 CLINICAL DATA:  Fever. EXAM: CT ABDOMEN AND PELVIS WITHOUT CONTRAST TECHNIQUE: Multidetector CT imaging of the abdomen and pelvis was performed following the standard protocol without IV contrast. RADIATION DOSE REDUCTION: This exam was performed according to the departmental dose-optimization program which includes automated exposure control, adjustment of the mA and/or kV according to patient size and/or use of iterative reconstruction technique. COMPARISON:  Jun 19, 2018. FINDINGS: Lower chest: Small pleural effusions are  noted with adjacent bibasilar atelectasis or edema. Right middle lobe atelectasis or pneumonia is noted. Hepatobiliary: No focal liver abnormality is seen. No gallstones, gallbladder wall thickening, or biliary dilatation. Pancreas: Unremarkable. No pancreatic ductal dilatation or surrounding inflammatory changes. Spleen: Normal in size without focal abnormality. Adrenals/Urinary Tract: Adrenal glands are unremarkable. Kidneys are normal, without renal calculi, focal lesion, or hydronephrosis. Bladder is unremarkable. Stomach/Bowel: Stomach is unremarkable. There is no evidence of bowel obstruction or inflammation. Diverticulosis of descending and sigmoid colon is noted without inflammation. The appendix is not visualized. Vascular/Lymphatic: Aortic atherosclerosis. No enlarged abdominal or pelvic lymph nodes. Reproductive: Status post hysterectomy. No adnexal masses. Other: No ascites or hernia. Musculoskeletal: Status post left total hip arthroplasty. No acute osseous abnormality is noted. IMPRESSION: Small pleural effusions are noted with adjacent bibasilar atelectasis or edema. Right middle lobe atelectasis or pneumonia is noted. Diverticulosis of descending and sigmoid colon is noted without inflammation. Aortic Atherosclerosis (ICD10-I70.0). Electronically Signed   By: Rosalene Colon M.D.   On: 06/10/2023 11:45   CT HEAD WO CONTRAST ( ) Result Date: 06/10/2023 CLINICAL DATA:  Altered mental status, weakness, febrile. EXAM: CT HEAD WITHOUT CONTRAST TECHNIQUE: Contiguous axial images were obtained from the base of the skull through the vertex without intravenous contrast. RADIATION DOSE REDUCTION: This exam was performed according to the departmental dose-optimization program which includes automated exposure control, adjustment of the mA and/or kV according to patient size and/or use of iterative reconstruction technique. COMPARISON:  CT head 02/21/2021. FINDINGS: Brain: No acute intracranial hemorrhage.  No CT evidence of acute infarct. No edema, mass effect, or midline shift. The basilar cisterns are patent. Ventricles: The ventricles are normal. Vascular: Atherosclerotic calcifications of the carotid siphons. No hyperdense vessel. Skull: No acute or aggressive finding. Orbits: Orbits are symmetric. Sinuses: Mucosal thickening in the ethmoid and sphenoid sinuses. Mild mucosal thickening extending into the left frontal sinus. Other: Mastoid air cells are clear. IMPRESSION: No CT evidence of acute intracranial abnormality. Mild paranasal sinus disease as above. Electronically Signed   By: Denny Flack M.D.   On: 06/10/2023 10:51   DG Chest Port 1 View Result Date: 06/10/2023 CLINICAL DATA:  Sepsis. EXAM: PORTABLE CHEST 1 VIEW COMPARISON:  January 07, 2010. FINDINGS: Stable cardiomediastinal silhouette. Bibasilar opacities are noted concerning for pneumonia or atelectasis.  Left lingular subsegmental atelectasis is noted. Bony thorax is unremarkable. IMPRESSION: Bibasilar opacities concerning for pneumonia or atelectasis. Electronically Signed   By: Rosalene Colon M.D.   On: 06/10/2023 09:57    Microbiology: Results for orders placed or performed during the hospital encounter of 06/10/23  Blood Culture (routine x 2)     Status: None (Preliminary result)   Collection Time: 06/10/23  8:38 AM   Specimen: BLOOD  Result Value Ref Range Status   Specimen Description   Final    BLOOD Blood Culture results may not be optimal due to an inadequate volume of blood received in culture bottles   Special Requests   Final    BOTTLES DRAWN AEROBIC AND ANAEROBIC RIGHT ANTECUBITAL   Culture   Final    NO GROWTH 3 DAYS Performed at [facility], [address] [place], [place] [address]    Report Status PENDING  Incomplete  Blood Culture (routine x 2)     Status: None (Preliminary result)   Collection Time: 06/10/23  8:39 AM   Specimen: BLOOD LEFT ARM  Result Value Ref Range Status   Specimen  Description BLOOD LEFT ARM  Final   Special Requests   Final    BOTTLES DRAWN AEROBIC AND ANAEROBIC Blood Culture adequate volume   Culture   Final    NO GROWTH 3 DAYS Performed at [facility], [address], [place], [place] [address]    Report Status PENDING  Incomplete  Resp panel by RT-PCR (RSV, Flu A&B, Covid) Anterior Nasal Swab     Status: Abnormal   Collection Time: 06/10/23  9:00 AM   Specimen: Anterior Nasal Swab  Result Value Ref Range Status   SARS Coronavirus 2 by RT PCR POSITIVE (A) NEGATIVE Final    Comment: (NOTE) SARS-CoV-2 target nucleic acids are DETECTED.  The SARS-CoV-2 RNA is generally detectable in upper respiratory specimens during the acute phase of infection. Positive results are indicative of the presence of the identified virus, but do not rule out bacterial infection or co-infection with other pathogens not detected by the test. Clinical correlation with patient history and other diagnostic information is necessary to determine patient infection status. The expected result is Negative.  Fact Sheet for Patients: BloggerCourse.com  Fact Sheet for Healthcare Providers: SeriousBroker.it  This test is not yet approved or cleared by the [place]  FDA and  has been authorized for detection and/or diagnosis of SARS-CoV-2 by FDA under an Emergency Use Authorization (EUA).  This EUA will remain in effect (meaning this test can be used) for the duration of  the COVID-19 declaration under Section 564(b)(1) of the A ct, 21 U.S.C. section 360bbb-3(b)(1), unless the authorization is terminated or revoked sooner.     Influenza A by PCR NEGATIVE NEGATIVE Final   Influenza B by PCR NEGATIVE NEGATIVE Final    Comment: (NOTE) The Xpert Xpress SARS-CoV-2/FLU/RSV plus assay is intended as an aid in the diagnosis of influenza from Nasopharyngeal swab specimens and should not be used as a sole basis for  treatment. Nasal washings and aspirates are unacceptable for Xpert Xpress SARS-CoV-2/FLU/RSV testing.  Fact Sheet for Patients: BloggerCourse.com  Fact Sheet for Healthcare Providers: SeriousBroker.it  This test is not yet approved or cleared by the [place]  FDA and has been authorized for detection and/or diagnosis of SARS-CoV-2 by FDA under an Emergency Use Authorization (EUA). This EUA will remain in effect (meaning this test can be used) for the duration of the COVID-19  declaration under Section 564(b)(1) of the Act, 21 U.S.C. section 360bbb-3(b)(1), unless the authorization is terminated or revoked.     Resp Syncytial Virus by PCR NEGATIVE NEGATIVE Final    Comment: (NOTE) Fact Sheet for Patients: BloggerCourse.com  Fact Sheet for Healthcare Providers: SeriousBroker.it  This test is not yet approved or cleared by the [place]  FDA and has been authorized for detection and/or diagnosis of SARS-CoV-2 by FDA under an Emergency Use Authorization (EUA). This EUA will remain in effect (meaning this test can be used) for the duration of the COVID-19 declaration under Section 564(b)(1) of the Act, 21 U.S.C. section 360bbb-3(b)(1), unless the authorization is terminated or revoked.  Performed at [facility], [address] [place], [place] [address]     Labs: CBC: Recent Labs  Lab 06/10/23 [phone] 06/11/23 0445 06/12/23 0548 06/13/23 0247  WBC 4.6 2.6* 9.8 5.6  NEUTROABS 2.4  --   --   --   HGB 10.6* 10.1* 10.3* 9.3*  HCT 32.4* 30.9* 30.8* 28.4*  MCV 91.0 89.6 87.3 89.3  PLT 154 159 214 203   Basic Metabolic Panel: Recent Labs  Lab 06/10/23 0838 06/11/23 0445 06/12/23 0548 06/13/23 0247  NA 134* 140 144 145  K 4.0 4.2 3.7 3.5  CL 104 112* 118* 120*  CO2 22 18* 19* 19*  GLUCOSE 94 153* 88 91  BUN 34* 33* 31* 24*  CREATININE 1.90* 1.41*  1.27* 1.26*  CALCIUM 8.0* 8.0* 8.0* 7.8*   Liver Function Tests: Recent Labs  Lab 06/10/23 0838  AST 19  ALT 16  ALKPHOS 37*  BILITOT 0.5  PROT 6.2*  ALBUMIN 2.9*    Discharge time spent: greater than 30 minutes.  Signed: Melvinia Stager, MD [facility] 06/13/2023

## 2023-06-13 NOTE — Plan of Care (Signed)
  Problem: Coping: Goal: Psychosocial and spiritual needs will be supported Outcome: Progressing   Problem: Respiratory: Goal: Will maintain a patent airway 06/13/2023 0507 by Rhoda Centers, RN Outcome: Progressing 06/13/2023 0437 by Rhoda Centers, RN Outcome: Progressing   Problem: Respiratory: Goal: Complications related to the disease process, condition or treatment will be avoided or minimized Outcome: Progressing   Problem: Education: Goal: Knowledge of General Education information will improve Description: Including pain rating scale, medication(s)/side effects and non-pharmacologic comfort measures Outcome: Progressing   Problem: Health Behavior/Discharge Planning: Goal: Ability to manage health-related needs will improve Outcome: Progressing   Problem: Clinical Measurements: Goal: Diagnostic test results will improve Outcome: Progressing   Problem: Clinical Measurements: Goal: Ability to maintain clinical measurements within normal limits will improve Outcome: Progressing   Problem: Activity: Goal: Risk for activity intolerance will decrease Outcome: Progressing   Problem: Coping: Goal: Level of anxiety will decrease Outcome: Progressing   Problem: Elimination: Goal: Will not experience complications related to bowel motility Outcome: Progressing

## 2023-06-13 NOTE — TOC Transition Note (Addendum)
 Transition of Care [facility]) - Discharge Note   Patient Details  Name: [name] MRN: [id number] Date of Birth: [birth year]  Transition of Care [facility]) CM/SW Contact:  Crayton Docker, RN 06/13/2023, 12:55 PM   Clinical Narrative:     Alert received from Dr. Lydia Sams regarding patient is medically ready to discharge and return to [facility]. CM call to [facility], phone: [phone] regarding pending discharge. CM spoke to [facility], Advice worker. Per Stana Ear, facility does not provide transport and for CM to contact patient's son, Myrtie Atkinson.   CM completed returning FL2, and alerted to Dr. Lydia Sams for signature. CM faxed returning FL2, discharge orders, discharge summary to [facility], fax: [phone] via [employer].  CM call to patient's son, Myrtie Atkinson, phone: [phone] regarding patient return to ALF. No answer, CM left message for patient's son call back. CM call to patent's niece, Amalia Badder, phone: [phone].  Call received from patient's son, Myrtie Atkinson regarding transportation. Per patient's son, Myrtie Atkinson, requests EMS. CM call to [employer], phone: [phone], BLS transport scheduled for 1430.   CM call to Naoma Bacca [facility], phone: [phone] regarding home health PT/OT referral. Per Shaun, will review and call CM back.   CM provided RN [place]  RN report number, [phone] and to ask for [facility], Resident Care Coordinator.   Final next level of care: Assisted Living Barriers to Discharge: No Barriers Identified   Patient Goals and CMS Choice    ALF/[facility]   Discharge Placement     Return to prior facility          Discharge Plan and Services Additional resources added to the After Visit Summary for    Social Drivers of Health (SDOH) Interventions SDOH Screenings   Food Insecurity: No Food Insecurity (06/10/2023)  Housing: Low Risk  (06/10/2023)  Transportation Needs: No Transportation Needs (06/10/2023)  Utilities: Not At Risk  (06/10/2023)  Depression (PHQ2-9): Low Risk  (12/31/2022)  Financial Resource Strain: Low Risk  (10/01/2022)   Received from [facility]  Social Connections: Socially Isolated (06/10/2023)  Tobacco Use: Low Risk  (06/10/2023)     Readmission Risk Interventions     No data to display

## 2023-06-15 LAB — CULTURE, BLOOD (ROUTINE X 2)
Culture: NO GROWTH
Culture: NO GROWTH
Special Requests: ADEQUATE

## 2023-10-13 ENCOUNTER — Other Ambulatory Visit: Payer: Self-pay | Admitting: Physician Assistant

## 2023-10-13 DIAGNOSIS — Z1231 Encounter for screening mammogram for malignant neoplasm of breast: Secondary | ICD-10-CM

## 2023-11-12 ENCOUNTER — Ambulatory Visit
Admission: RE | Admit: 2023-11-12 | Discharge: 2023-11-12 | Disposition: A | Discharge status: Home / Self Care | Source: Ambulatory Visit | Attending: Physician Assistant | Admitting: Physician Assistant

## 2023-11-12 DIAGNOSIS — Z1231 Encounter for screening mammogram for malignant neoplasm of breast: Secondary | ICD-10-CM | POA: Diagnosis present
